# Patient Record
Sex: Male | Born: 1945
Health system: Southern US, Community
[De-identification: ages and names within clinical notes are randomized; demographics above are authoritative.]

## PROBLEM LIST (undated history)

## (undated) DIAGNOSIS — N183 Chronic kidney disease, stage 3 unspecified: Secondary | ICD-10-CM

## (undated) DIAGNOSIS — Z9289 Personal history of other medical treatment: Secondary | ICD-10-CM

## (undated) DIAGNOSIS — I10 Essential (primary) hypertension: Secondary | ICD-10-CM

## (undated) DIAGNOSIS — E785 Hyperlipidemia, unspecified: Secondary | ICD-10-CM

## (undated) DIAGNOSIS — Z8739 Personal history of other diseases of the musculoskeletal system and connective tissue: Secondary | ICD-10-CM

## (undated) DIAGNOSIS — E109 Type 1 diabetes mellitus without complications: Secondary | ICD-10-CM

## (undated) DIAGNOSIS — R000 Tachycardia, unspecified: Secondary | ICD-10-CM

## (undated) DIAGNOSIS — C4491 Basal cell carcinoma of skin, unspecified: Secondary | ICD-10-CM

## (undated) DIAGNOSIS — Z9109 Other allergy status, other than to drugs and biological substances: Secondary | ICD-10-CM

## (undated) DIAGNOSIS — I639 Cerebral infarction, unspecified: Secondary | ICD-10-CM

## (undated) DIAGNOSIS — Z8719 Personal history of other diseases of the digestive system: Secondary | ICD-10-CM

## (undated) DIAGNOSIS — R339 Retention of urine, unspecified: Secondary | ICD-10-CM

## (undated) DIAGNOSIS — E781 Pure hyperglyceridemia: Secondary | ICD-10-CM

## (undated) DIAGNOSIS — J189 Pneumonia, unspecified organism: Secondary | ICD-10-CM

## (undated) DIAGNOSIS — C44222 Squamous cell carcinoma of skin of right ear and external auricular canal: Secondary | ICD-10-CM

## (undated) DIAGNOSIS — H9313 Tinnitus, bilateral: Secondary | ICD-10-CM

## (undated) DIAGNOSIS — K219 Gastro-esophageal reflux disease without esophagitis: Secondary | ICD-10-CM

## (undated) DIAGNOSIS — Z973 Presence of spectacles and contact lenses: Secondary | ICD-10-CM

## (undated) DIAGNOSIS — M1990 Unspecified osteoarthritis, unspecified site: Secondary | ICD-10-CM

## (undated) DIAGNOSIS — R05 Cough: Secondary | ICD-10-CM

## (undated) DIAGNOSIS — R053 Chronic cough: Secondary | ICD-10-CM

## (undated) DIAGNOSIS — Z9641 Presence of insulin pump (external) (internal): Secondary | ICD-10-CM

## (undated) DIAGNOSIS — Z972 Presence of dental prosthetic device (complete) (partial): Secondary | ICD-10-CM

## (undated) DIAGNOSIS — M25551 Pain in right hip: Secondary | ICD-10-CM

## (undated) DIAGNOSIS — Z9189 Other specified personal risk factors, not elsewhere classified: Secondary | ICD-10-CM

## (undated) DIAGNOSIS — Z8639 Personal history of other endocrine, nutritional and metabolic disease: Secondary | ICD-10-CM

## (undated) DIAGNOSIS — R3915 Urgency of urination: Secondary | ICD-10-CM

## (undated) HISTORY — PX: EXCISIONAL HEMORRHOIDECTOMY: SHX1541

## (undated) HISTORY — PX: COLONOSCOPY: SHX174

## (undated) HISTORY — PX: CIRCUMCISION: SUR203

## (undated) HISTORY — PX: HEMORRHOID BANDING: SHX5850

## (undated) HISTORY — PX: SQUAMOUS CELL CARCINOMA EXCISION: SHX2433

## (undated) HISTORY — PX: CATARACT EXTRACTION W/ INTRAOCULAR LENS  IMPLANT, BILATERAL: SHX1307

## (undated) HISTORY — PX: EYE SURGERY: SHX253

## (undated) HISTORY — PX: INGUINAL HERNIA REPAIR: SUR1180

---

## 1998-01-04 ENCOUNTER — Ambulatory Visit (HOSPITAL_COMMUNITY): Admission: RE | Admit: 1998-01-04 | Discharge: 1998-01-04 | Payer: Self-pay | Admitting: Gastroenterology

## 2001-06-17 ENCOUNTER — Encounter: Admission: RE | Admit: 2001-06-17 | Discharge: 2001-09-15 | Payer: Self-pay | Admitting: Endocrinology

## 2005-12-14 ENCOUNTER — Emergency Department (HOSPITAL_COMMUNITY): Admission: EM | Admit: 2005-12-14 | Discharge: 2005-12-14 | Payer: Self-pay | Admitting: Emergency Medicine

## 2006-06-22 DIAGNOSIS — J189 Pneumonia, unspecified organism: Secondary | ICD-10-CM

## 2006-06-22 HISTORY — DX: Pneumonia, unspecified organism: J18.9

## 2006-08-17 ENCOUNTER — Inpatient Hospital Stay (HOSPITAL_COMMUNITY): Admission: EM | Admit: 2006-08-17 | Discharge: 2006-08-19 | Payer: Self-pay | Admitting: *Deleted

## 2007-03-07 ENCOUNTER — Ambulatory Visit: Payer: Self-pay | Admitting: Surgery

## 2007-06-23 HISTORY — PX: ESOPHAGOGASTRODUODENOSCOPY ENDOSCOPY: SHX5814

## 2008-05-22 ENCOUNTER — Ambulatory Visit: Payer: Self-pay | Admitting: Internal Medicine

## 2008-05-22 DIAGNOSIS — K219 Gastro-esophageal reflux disease without esophagitis: Secondary | ICD-10-CM | POA: Insufficient documentation

## 2008-05-22 DIAGNOSIS — R1319 Other dysphagia: Secondary | ICD-10-CM | POA: Insufficient documentation

## 2008-05-22 DIAGNOSIS — E109 Type 1 diabetes mellitus without complications: Secondary | ICD-10-CM | POA: Insufficient documentation

## 2008-06-20 ENCOUNTER — Ambulatory Visit: Payer: Self-pay | Admitting: Internal Medicine

## 2010-11-04 NOTE — Procedures (Signed)
CAROTID DUPLEX EXAM   INDICATION:  Follow up carotid artery disease per [facility] screening.   HISTORY:  Diabetes:  Yes  Cardiac:  No  Hypertension:  No  Smoking:  Quit  Previous Surgery:  No  CV History:  No  Amaurosis Fugax No, Paresthesias No, Hemiparesis No                                       RIGHT             LEFT  Brachial systolic pressure:         125               115  Brachial Doppler waveforms:         Triphasic         Triphasic  Vertebral direction of flow:        Antegrade         Antegrade  DUPLEX VELOCITIES (cm/sec)  CCA peak systolic                   96                88  ECA peak systolic                   157               142  ICA peak systolic                   56                91  ICA end diastolic                   14                30  PLAQUE MORPHOLOGY:                  Homogenous        Homogenous  PLAQUE AMOUNT:                      Minimal           Minimal  PLAQUE LOCATION:                    ICA/bifurcation   ICA/bifurcation   IMPRESSION:  1. The right shows evidence of 1-19% stenosis involving the internal      carotid artery.  2. The left shows evidence of 1-39% stenosis involving the internal      carotid artery.   ___________________________________________  Janetta Hora Fields, MD   AS/MEDQ  D:  03/07/2007  T:  03/08/2007  Job:  [id number]

## 2010-11-07 NOTE — Discharge Summary (Signed)
Randall Horton, Randall Horton                 ACCOUNT NO.:  [id number]   MEDICAL RECORD NO.:  [id number]          PATIENT TYPE:  INP   LOCATION:  5158                         FACILITY:  [facility]   PHYSICIAN:  Tera Mater. Evlyn Kanner, M.D. DATE OF BIRTH:  Feb 07, 1946   DATE OF ADMISSION:  08/16/2006  DATE OF DISCHARGE:  08/19/2006                               DISCHARGE SUMMARY   DISCHARGE DIAGNOSES:  1. Community-acquired pneumonia, clinically improved.  2. Type 1 diabetes on insulin pump.  3. History of gout.   HOSPITAL COURSE:  [name] is a 65 year old, white male with known type  1 diabetes who presented to my partner, Dr. Jacky Kindle, with a left-sided  bacterial pneumonia on August 17, 2006.  He is actually quite ill at  presentation with some volume deficits, tachycardia and some hypoxia.  He did relatively well initially with IV fluids, antibiotics and his  blood sugars were under reasonable control.  He was clinically doing  better and tragically, his wife left the hospital and died at home.  Basically, this shortened his hospital stay.  His oxygen saturation was  reasonable.  He was still mildly febrile with a mild leukocytosis, but  clearly better and I offered him an earlier than optimal discharge due  to the family circumstances.  He did take me up on this offer and left  at my request on August 19, 2006.  Followup was scheduled in 1 week.   DISCHARGE MEDICATIONS:  1. Avelox to continue out a course x10 days.  2. Plavix 75 daily.  3. Allopurinol 30 mg daily and resume his insulin pump.   SPECIAL INSTRUCTIONS:  He was to call if he had substantial fevers or  decompensation.   DIET:  His diet was to be no concentrated sweets as before.   LABORATORY DATA:  Initial white count was at 15,300, hemoglobin 12.4,  platelets 339,000.  Initial sodium 129, potassium 4.9, chloride 100, CO2  was not calculated, glucose 326.  On February 28, sodium was normalized  at 138, potassium 3.9, chloride  103, CO2 26, BUN 11, creatinine 1.05,  glucose of 72.  Influenza A and B were both negative.   A chest x-ray on February 28, showed left lower lobe infiltrate with  pulmonary vascular prominence, question granulomas.  Repeat showed mild  worsening of left lower airspace disease with no evidence of pleural  effusions.  Electrocardiogram was sinus tachycardia with no ischemic  changes.   In summary, we have a 65 year old with diabetes presenting with  community-acquired pneumonia.  He was treated as above and discharged  home quicker than optimally due to a family tragedy.  Close followup as  planned.           ______________________________  Tera Mater. Evlyn Kanner, M.D.     SAS/MEDQ  D:  10/26/2006  T:  10/26/2006  Job:  [id number]

## 2010-11-07 NOTE — H&P (Signed)
NAMEMarland Horton  KOLBEY, TEICHERT NO.:  [id number]   MEDICAL RECORD NO.:  [id number]          PATIENT TYPE:  INP   LOCATION:  1824                         FACILITY:  [facility]   PHYSICIAN:  Geoffry Paradise, M.D.  DATE OF BIRTH:  1946-06-19   DATE OF ADMISSION:  08/17/2006  DATE OF DISCHARGE:                              HISTORY & PHYSICAL   CHIEF COMPLAINT:  Left pneumonia.   HISTORY OF PRESENT ILLNESS:  Randall Horton is a pleasant 65 year old  gentleman with diabetes mellitus type 1 on insulin pump since 1995,  otherwise, healthy, who presents with several days of cold symptoms and  progressive chest congestion.  Evidently developed cold symptoms  approximately 5 days ago and has progressively declined particularly  most of the day yesterday.  Late last evening, upon referral from [facility], he actually presented to the emergency room with  progressive cough, congestion and shortness of breath.  He evidently did  not contact our office and ultimately when condition was worsening  yesterday, presented to [facility].  It was not clear that an  extensive workup was done beyond auscultation and referral to the  emergency room.  In the emergency room here, he was hypoxemic on room  air at 88%, low grade fever.  Chest x-ray revealed a left infiltrate  resulting in referral for this admission.  He has had no sputum  production but has had some pleuritic-type chest pain with this  congestion.  He had no nausea, vomiting, but as had some mild loose  stools.  He has had no rash headaches or abdominal pain.  Blood sugars  remained relatively stable and appetite remained stable.  He is admitted  for further management.   ALLERGIES:  ASPIRIN, PENICILLIN, TETANUS.   MEDICATIONS:  1. Allopurinol 300 daily.  2. Humalog insulin per pump, continuous infusion.  3. Plavix 75 mg daily.  4. Prilosec 20 mg p.o.   MEDICAL ILLNESSES:  1. Diabetes mellitus type 1 since 1995.  2. He is on Plavix because he is intolerant of aspirin in a      preventative way with no prior stroke or cardiac history.   SURGICAL ILLNESSES:  1. Hernia x2.  2. Colonoscopy x2.   FAMILY HISTORY:  Noncontributory.   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  Does not smoke or drink.  He is married.   PHYSICAL EXAMINATION:  VITAL SIGNS:  Temperature 99.5, blood pressure is  110/65, pulse 122 and regular, respiratory rate 18.  Continuous  coughing.  O2 saturation 98% on 2 liters.  GENERAL:  The patient is awake, alert, nontoxic-appearing.  HEENT: Mild conjunctival injection bilaterally.  Anicteric.  Oropharynx  benign.  NECK: No JVD or bruits.  LUNGS: Reveal rhonchi and left lung field, right is clear.  No rales,  wheezing.  Good breath sounds.  CARDIOVASCULAR:  Distant, regular, tachycardiac.  No murmur, S3 or S4.  ABDOMEN:  Soft and nontender.  No hepatosplenomegaly.  Bowel sounds  normal.  BACK:  No CVA or spinal  tenderness.  EXTREMITIES:  No cyanosis, clubbing or edema.  Intact distal pulses.  Joints normal.  Calves soft and nontender.  Negative Homan.  GU/RECTAL:  Not done.  NEUROLOGIC:  Exam not lateralizing.  SKIN:  He does have patchy erythema right distal lower extremity,  appears eczematous.   STUDIES:  Chest x-ray:  Left infiltrate compatible pneumonia.  No  evidence of CHF.   ASSESSMENT:  1. Left pneumonia, presumed bacterial.  2. Diabetes mellitus type 1/2 insulin pump.  3. Hypoxemia secondary to pneumonia.   PLAN:  Admit for IV antibiotics, IV fluids.  Continue insulin pump.  Blood sugars stable currently.           ______________________________  Geoffry Paradise, M.D.     RA/MEDQ  D:  08/17/2006  T:  08/17/2006  Job:  [id number]

## 2011-03-27 LAB — GLUCOSE, CAPILLARY
Glucose-Capillary: 121 mg/dL — ABNORMAL HIGH (ref 70–99)
Glucose-Capillary: 126 mg/dL — ABNORMAL HIGH (ref 70–99)

## 2011-06-23 DIAGNOSIS — Z9289 Personal history of other medical treatment: Secondary | ICD-10-CM

## 2011-06-23 HISTORY — DX: Personal history of other medical treatment: Z92.89

## 2011-07-06 DIAGNOSIS — E785 Hyperlipidemia, unspecified: Secondary | ICD-10-CM | POA: Diagnosis not present

## 2011-07-06 DIAGNOSIS — Z125 Encounter for screening for malignant neoplasm of prostate: Secondary | ICD-10-CM | POA: Diagnosis not present

## 2011-07-06 DIAGNOSIS — E559 Vitamin D deficiency, unspecified: Secondary | ICD-10-CM | POA: Diagnosis not present

## 2011-07-06 DIAGNOSIS — Z0000 Encounter for general adult medical examination without abnormal findings: Secondary | ICD-10-CM | POA: Diagnosis not present

## 2011-07-06 DIAGNOSIS — E0590 Thyrotoxicosis, unspecified without thyrotoxic crisis or storm: Secondary | ICD-10-CM | POA: Diagnosis not present

## 2011-07-06 DIAGNOSIS — E1039 Type 1 diabetes mellitus with other diabetic ophthalmic complication: Secondary | ICD-10-CM | POA: Diagnosis not present

## 2011-08-19 DIAGNOSIS — E1039 Type 1 diabetes mellitus with other diabetic ophthalmic complication: Secondary | ICD-10-CM | POA: Diagnosis not present

## 2011-08-19 DIAGNOSIS — K219 Gastro-esophageal reflux disease without esophagitis: Secondary | ICD-10-CM | POA: Diagnosis not present

## 2011-08-19 DIAGNOSIS — A088 Other specified intestinal infections: Secondary | ICD-10-CM | POA: Diagnosis not present

## 2011-09-11 DIAGNOSIS — R339 Retention of urine, unspecified: Secondary | ICD-10-CM | POA: Diagnosis not present

## 2011-09-11 DIAGNOSIS — R351 Nocturia: Secondary | ICD-10-CM | POA: Diagnosis not present

## 2011-09-11 DIAGNOSIS — R350 Frequency of micturition: Secondary | ICD-10-CM | POA: Diagnosis not present

## 2011-09-29 DIAGNOSIS — R350 Frequency of micturition: Secondary | ICD-10-CM | POA: Diagnosis not present

## 2011-09-29 DIAGNOSIS — R3989 Other symptoms and signs involving the genitourinary system: Secondary | ICD-10-CM | POA: Diagnosis not present

## 2011-09-29 DIAGNOSIS — R339 Retention of urine, unspecified: Secondary | ICD-10-CM | POA: Diagnosis not present

## 2011-09-29 DIAGNOSIS — R351 Nocturia: Secondary | ICD-10-CM | POA: Diagnosis not present

## 2011-11-10 DIAGNOSIS — N401 Enlarged prostate with lower urinary tract symptoms: Secondary | ICD-10-CM | POA: Diagnosis not present

## 2011-11-10 DIAGNOSIS — E1039 Type 1 diabetes mellitus with other diabetic ophthalmic complication: Secondary | ICD-10-CM | POA: Diagnosis not present

## 2011-11-10 DIAGNOSIS — N138 Other obstructive and reflux uropathy: Secondary | ICD-10-CM | POA: Diagnosis not present

## 2011-11-10 DIAGNOSIS — N058 Unspecified nephritic syndrome with other morphologic changes: Secondary | ICD-10-CM | POA: Diagnosis not present

## 2011-11-10 DIAGNOSIS — R209 Unspecified disturbances of skin sensation: Secondary | ICD-10-CM | POA: Diagnosis not present

## 2011-11-10 DIAGNOSIS — N529 Male erectile dysfunction, unspecified: Secondary | ICD-10-CM | POA: Diagnosis not present

## 2011-12-03 DIAGNOSIS — H348392 Tributary (branch) retinal vein occlusion, unspecified eye, stable: Secondary | ICD-10-CM | POA: Diagnosis not present

## 2011-12-03 DIAGNOSIS — Z961 Presence of intraocular lens: Secondary | ICD-10-CM | POA: Diagnosis not present

## 2011-12-14 DIAGNOSIS — R109 Unspecified abdominal pain: Secondary | ICD-10-CM | POA: Diagnosis not present

## 2011-12-14 DIAGNOSIS — M62838 Other muscle spasm: Secondary | ICD-10-CM | POA: Diagnosis not present

## 2011-12-17 DIAGNOSIS — R109 Unspecified abdominal pain: Secondary | ICD-10-CM | POA: Diagnosis not present

## 2011-12-17 DIAGNOSIS — M62838 Other muscle spasm: Secondary | ICD-10-CM | POA: Diagnosis not present

## 2011-12-17 DIAGNOSIS — R351 Nocturia: Secondary | ICD-10-CM | POA: Diagnosis not present

## 2011-12-29 DIAGNOSIS — R109 Unspecified abdominal pain: Secondary | ICD-10-CM | POA: Diagnosis not present

## 2011-12-29 DIAGNOSIS — R350 Frequency of micturition: Secondary | ICD-10-CM | POA: Diagnosis not present

## 2011-12-29 DIAGNOSIS — M62838 Other muscle spasm: Secondary | ICD-10-CM | POA: Diagnosis not present

## 2012-01-07 DIAGNOSIS — R109 Unspecified abdominal pain: Secondary | ICD-10-CM | POA: Diagnosis not present

## 2012-01-07 DIAGNOSIS — M62838 Other muscle spasm: Secondary | ICD-10-CM | POA: Diagnosis not present

## 2012-01-07 DIAGNOSIS — R350 Frequency of micturition: Secondary | ICD-10-CM | POA: Diagnosis not present

## 2012-01-28 DIAGNOSIS — R109 Unspecified abdominal pain: Secondary | ICD-10-CM | POA: Diagnosis not present

## 2012-01-28 DIAGNOSIS — M62838 Other muscle spasm: Secondary | ICD-10-CM | POA: Diagnosis not present

## 2012-01-28 DIAGNOSIS — R350 Frequency of micturition: Secondary | ICD-10-CM | POA: Diagnosis not present

## 2012-03-09 DIAGNOSIS — E559 Vitamin D deficiency, unspecified: Secondary | ICD-10-CM | POA: Diagnosis not present

## 2012-03-09 DIAGNOSIS — M069 Rheumatoid arthritis, unspecified: Secondary | ICD-10-CM | POA: Diagnosis not present

## 2012-03-09 DIAGNOSIS — E1039 Type 1 diabetes mellitus with other diabetic ophthalmic complication: Secondary | ICD-10-CM | POA: Diagnosis not present

## 2012-03-09 DIAGNOSIS — E785 Hyperlipidemia, unspecified: Secondary | ICD-10-CM | POA: Diagnosis not present

## 2012-03-09 DIAGNOSIS — Z23 Encounter for immunization: Secondary | ICD-10-CM | POA: Diagnosis not present

## 2012-03-23 DIAGNOSIS — E119 Type 2 diabetes mellitus without complications: Secondary | ICD-10-CM | POA: Diagnosis not present

## 2012-03-23 DIAGNOSIS — I739 Peripheral vascular disease, unspecified: Secondary | ICD-10-CM | POA: Diagnosis not present

## 2012-03-23 DIAGNOSIS — R5383 Other fatigue: Secondary | ICD-10-CM | POA: Diagnosis not present

## 2012-03-23 DIAGNOSIS — R5381 Other malaise: Secondary | ICD-10-CM | POA: Diagnosis not present

## 2012-04-13 DIAGNOSIS — R079 Chest pain, unspecified: Secondary | ICD-10-CM | POA: Diagnosis not present

## 2012-04-13 DIAGNOSIS — R42 Dizziness and giddiness: Secondary | ICD-10-CM | POA: Diagnosis not present

## 2012-04-13 DIAGNOSIS — E109 Type 1 diabetes mellitus without complications: Secondary | ICD-10-CM | POA: Diagnosis not present

## 2012-04-13 DIAGNOSIS — R0602 Shortness of breath: Secondary | ICD-10-CM | POA: Diagnosis not present

## 2012-05-30 DIAGNOSIS — H348392 Tributary (branch) retinal vein occlusion, unspecified eye, stable: Secondary | ICD-10-CM | POA: Diagnosis not present

## 2012-06-08 DIAGNOSIS — N138 Other obstructive and reflux uropathy: Secondary | ICD-10-CM | POA: Diagnosis not present

## 2012-06-08 DIAGNOSIS — N401 Enlarged prostate with lower urinary tract symptoms: Secondary | ICD-10-CM | POA: Diagnosis not present

## 2012-06-08 DIAGNOSIS — E1039 Type 1 diabetes mellitus with other diabetic ophthalmic complication: Secondary | ICD-10-CM | POA: Diagnosis not present

## 2012-06-08 DIAGNOSIS — N058 Unspecified nephritic syndrome with other morphologic changes: Secondary | ICD-10-CM | POA: Diagnosis not present

## 2012-06-08 DIAGNOSIS — I739 Peripheral vascular disease, unspecified: Secondary | ICD-10-CM | POA: Diagnosis not present

## 2012-07-15 DIAGNOSIS — L2089 Other atopic dermatitis: Secondary | ICD-10-CM | POA: Diagnosis not present

## 2012-07-15 DIAGNOSIS — L570 Actinic keratosis: Secondary | ICD-10-CM | POA: Diagnosis not present

## 2012-07-15 DIAGNOSIS — L821 Other seborrheic keratosis: Secondary | ICD-10-CM | POA: Diagnosis not present

## 2012-09-13 DIAGNOSIS — I739 Peripheral vascular disease, unspecified: Secondary | ICD-10-CM | POA: Diagnosis not present

## 2012-09-13 DIAGNOSIS — E1039 Type 1 diabetes mellitus with other diabetic ophthalmic complication: Secondary | ICD-10-CM | POA: Diagnosis not present

## 2012-09-13 DIAGNOSIS — I6529 Occlusion and stenosis of unspecified carotid artery: Secondary | ICD-10-CM | POA: Diagnosis not present

## 2012-09-13 DIAGNOSIS — E11339 Type 2 diabetes mellitus with moderate nonproliferative diabetic retinopathy without macular edema: Secondary | ICD-10-CM | POA: Diagnosis not present

## 2012-09-13 DIAGNOSIS — E785 Hyperlipidemia, unspecified: Secondary | ICD-10-CM | POA: Diagnosis not present

## 2012-09-13 DIAGNOSIS — E559 Vitamin D deficiency, unspecified: Secondary | ICD-10-CM | POA: Diagnosis not present

## 2012-09-13 DIAGNOSIS — N058 Unspecified nephritic syndrome with other morphologic changes: Secondary | ICD-10-CM | POA: Diagnosis not present

## 2012-09-15 DIAGNOSIS — E109 Type 1 diabetes mellitus without complications: Secondary | ICD-10-CM | POA: Diagnosis not present

## 2012-10-19 DIAGNOSIS — E119 Type 2 diabetes mellitus without complications: Secondary | ICD-10-CM | POA: Diagnosis not present

## 2012-10-19 DIAGNOSIS — E782 Mixed hyperlipidemia: Secondary | ICD-10-CM | POA: Diagnosis not present

## 2012-11-07 DIAGNOSIS — H348392 Tributary (branch) retinal vein occlusion, unspecified eye, stable: Secondary | ICD-10-CM | POA: Diagnosis not present

## 2012-11-10 DIAGNOSIS — M7730 Calcaneal spur, unspecified foot: Secondary | ICD-10-CM | POA: Diagnosis not present

## 2012-11-10 DIAGNOSIS — M12279 Villonodular synovitis (pigmented), unspecified ankle and foot: Secondary | ICD-10-CM | POA: Diagnosis not present

## 2012-11-10 DIAGNOSIS — M79609 Pain in unspecified limb: Secondary | ICD-10-CM | POA: Diagnosis not present

## 2012-11-10 DIAGNOSIS — M65979 Unspecified synovitis and tenosynovitis, unspecified ankle and foot: Secondary | ICD-10-CM | POA: Diagnosis not present

## 2012-11-10 DIAGNOSIS — M659 Synovitis and tenosynovitis, unspecified: Secondary | ICD-10-CM | POA: Diagnosis not present

## 2012-11-17 DIAGNOSIS — M65979 Unspecified synovitis and tenosynovitis, unspecified ankle and foot: Secondary | ICD-10-CM | POA: Diagnosis not present

## 2012-11-17 DIAGNOSIS — M12279 Villonodular synovitis (pigmented), unspecified ankle and foot: Secondary | ICD-10-CM | POA: Diagnosis not present

## 2012-11-17 DIAGNOSIS — M659 Synovitis and tenosynovitis, unspecified: Secondary | ICD-10-CM | POA: Diagnosis not present

## 2012-12-01 DIAGNOSIS — M659 Synovitis and tenosynovitis, unspecified: Secondary | ICD-10-CM | POA: Diagnosis not present

## 2012-12-01 DIAGNOSIS — M65979 Unspecified synovitis and tenosynovitis, unspecified ankle and foot: Secondary | ICD-10-CM | POA: Diagnosis not present

## 2012-12-01 DIAGNOSIS — M25579 Pain in unspecified ankle and joints of unspecified foot: Secondary | ICD-10-CM | POA: Diagnosis not present

## 2012-12-01 DIAGNOSIS — M12279 Villonodular synovitis (pigmented), unspecified ankle and foot: Secondary | ICD-10-CM | POA: Diagnosis not present

## 2012-12-15 DIAGNOSIS — R7402 Elevation of levels of lactic acid dehydrogenase (LDH): Secondary | ICD-10-CM | POA: Diagnosis not present

## 2012-12-15 DIAGNOSIS — I6529 Occlusion and stenosis of unspecified carotid artery: Secondary | ICD-10-CM | POA: Diagnosis not present

## 2012-12-15 DIAGNOSIS — E1039 Type 1 diabetes mellitus with other diabetic ophthalmic complication: Secondary | ICD-10-CM | POA: Diagnosis not present

## 2012-12-15 DIAGNOSIS — E559 Vitamin D deficiency, unspecified: Secondary | ICD-10-CM | POA: Diagnosis not present

## 2012-12-15 DIAGNOSIS — R7401 Elevation of levels of liver transaminase levels: Secondary | ICD-10-CM | POA: Diagnosis not present

## 2012-12-15 DIAGNOSIS — N401 Enlarged prostate with lower urinary tract symptoms: Secondary | ICD-10-CM | POA: Diagnosis not present

## 2012-12-15 DIAGNOSIS — N058 Unspecified nephritic syndrome with other morphologic changes: Secondary | ICD-10-CM | POA: Diagnosis not present

## 2012-12-15 DIAGNOSIS — E785 Hyperlipidemia, unspecified: Secondary | ICD-10-CM | POA: Diagnosis not present

## 2012-12-15 DIAGNOSIS — Z1331 Encounter for screening for depression: Secondary | ICD-10-CM | POA: Diagnosis not present

## 2012-12-15 DIAGNOSIS — N138 Other obstructive and reflux uropathy: Secondary | ICD-10-CM | POA: Diagnosis not present

## 2012-12-15 DIAGNOSIS — E11339 Type 2 diabetes mellitus with moderate nonproliferative diabetic retinopathy without macular edema: Secondary | ICD-10-CM | POA: Diagnosis not present

## 2013-03-22 DIAGNOSIS — E11339 Type 2 diabetes mellitus with moderate nonproliferative diabetic retinopathy without macular edema: Secondary | ICD-10-CM | POA: Diagnosis not present

## 2013-03-22 DIAGNOSIS — I739 Peripheral vascular disease, unspecified: Secondary | ICD-10-CM | POA: Diagnosis not present

## 2013-03-22 DIAGNOSIS — N138 Other obstructive and reflux uropathy: Secondary | ICD-10-CM | POA: Diagnosis not present

## 2013-03-22 DIAGNOSIS — N401 Enlarged prostate with lower urinary tract symptoms: Secondary | ICD-10-CM | POA: Diagnosis not present

## 2013-03-22 DIAGNOSIS — E1039 Type 1 diabetes mellitus with other diabetic ophthalmic complication: Secondary | ICD-10-CM | POA: Diagnosis not present

## 2013-03-22 DIAGNOSIS — E559 Vitamin D deficiency, unspecified: Secondary | ICD-10-CM | POA: Diagnosis not present

## 2013-03-22 DIAGNOSIS — M109 Gout, unspecified: Secondary | ICD-10-CM | POA: Diagnosis not present

## 2013-03-22 DIAGNOSIS — N058 Unspecified nephritic syndrome with other morphologic changes: Secondary | ICD-10-CM | POA: Diagnosis not present

## 2013-03-22 DIAGNOSIS — R6882 Decreased libido: Secondary | ICD-10-CM | POA: Diagnosis not present

## 2013-03-22 DIAGNOSIS — Z23 Encounter for immunization: Secondary | ICD-10-CM | POA: Diagnosis not present

## 2013-03-22 DIAGNOSIS — R7401 Elevation of levels of liver transaminase levels: Secondary | ICD-10-CM | POA: Diagnosis not present

## 2013-03-22 DIAGNOSIS — E785 Hyperlipidemia, unspecified: Secondary | ICD-10-CM | POA: Diagnosis not present

## 2013-03-22 DIAGNOSIS — E0590 Thyrotoxicosis, unspecified without thyrotoxic crisis or storm: Secondary | ICD-10-CM | POA: Diagnosis not present

## 2013-03-22 DIAGNOSIS — R7402 Elevation of levels of lactic acid dehydrogenase (LDH): Secondary | ICD-10-CM | POA: Diagnosis not present

## 2013-03-28 DIAGNOSIS — J0100 Acute maxillary sinusitis, unspecified: Secondary | ICD-10-CM | POA: Diagnosis not present

## 2013-03-28 DIAGNOSIS — Z6830 Body mass index (BMI) 30.0-30.9, adult: Secondary | ICD-10-CM | POA: Diagnosis not present

## 2013-05-08 DIAGNOSIS — E11311 Type 2 diabetes mellitus with unspecified diabetic retinopathy with macular edema: Secondary | ICD-10-CM | POA: Diagnosis not present

## 2013-05-08 DIAGNOSIS — H348392 Tributary (branch) retinal vein occlusion, unspecified eye, stable: Secondary | ICD-10-CM | POA: Diagnosis not present

## 2013-05-08 DIAGNOSIS — H53439 Sector or arcuate defects, unspecified eye: Secondary | ICD-10-CM | POA: Diagnosis not present

## 2013-06-28 DIAGNOSIS — E0590 Thyrotoxicosis, unspecified without thyrotoxic crisis or storm: Secondary | ICD-10-CM | POA: Diagnosis not present

## 2013-06-28 DIAGNOSIS — I6529 Occlusion and stenosis of unspecified carotid artery: Secondary | ICD-10-CM | POA: Diagnosis not present

## 2013-06-28 DIAGNOSIS — N058 Unspecified nephritic syndrome with other morphologic changes: Secondary | ICD-10-CM | POA: Diagnosis not present

## 2013-06-28 DIAGNOSIS — R059 Cough, unspecified: Secondary | ICD-10-CM | POA: Diagnosis not present

## 2013-06-28 DIAGNOSIS — N138 Other obstructive and reflux uropathy: Secondary | ICD-10-CM | POA: Diagnosis not present

## 2013-06-28 DIAGNOSIS — N401 Enlarged prostate with lower urinary tract symptoms: Secondary | ICD-10-CM | POA: Diagnosis not present

## 2013-06-28 DIAGNOSIS — E1039 Type 1 diabetes mellitus with other diabetic ophthalmic complication: Secondary | ICD-10-CM | POA: Diagnosis not present

## 2013-06-28 DIAGNOSIS — R05 Cough: Secondary | ICD-10-CM | POA: Diagnosis not present

## 2013-06-28 DIAGNOSIS — E1142 Type 2 diabetes mellitus with diabetic polyneuropathy: Secondary | ICD-10-CM | POA: Diagnosis not present

## 2013-06-28 DIAGNOSIS — L84 Corns and callosities: Secondary | ICD-10-CM | POA: Diagnosis not present

## 2013-09-27 DIAGNOSIS — M25559 Pain in unspecified hip: Secondary | ICD-10-CM | POA: Diagnosis not present

## 2013-09-27 DIAGNOSIS — R209 Unspecified disturbances of skin sensation: Secondary | ICD-10-CM | POA: Diagnosis not present

## 2013-09-27 DIAGNOSIS — E0590 Thyrotoxicosis, unspecified without thyrotoxic crisis or storm: Secondary | ICD-10-CM | POA: Diagnosis not present

## 2013-09-27 DIAGNOSIS — I739 Peripheral vascular disease, unspecified: Secondary | ICD-10-CM | POA: Diagnosis not present

## 2013-09-27 DIAGNOSIS — E559 Vitamin D deficiency, unspecified: Secondary | ICD-10-CM | POA: Diagnosis not present

## 2013-09-27 DIAGNOSIS — E11339 Type 2 diabetes mellitus with moderate nonproliferative diabetic retinopathy without macular edema: Secondary | ICD-10-CM | POA: Diagnosis not present

## 2013-09-27 DIAGNOSIS — E785 Hyperlipidemia, unspecified: Secondary | ICD-10-CM | POA: Diagnosis not present

## 2013-09-27 DIAGNOSIS — M109 Gout, unspecified: Secondary | ICD-10-CM | POA: Diagnosis not present

## 2013-09-27 DIAGNOSIS — N058 Unspecified nephritic syndrome with other morphologic changes: Secondary | ICD-10-CM | POA: Diagnosis not present

## 2013-09-27 DIAGNOSIS — Z23 Encounter for immunization: Secondary | ICD-10-CM | POA: Diagnosis not present

## 2013-09-27 DIAGNOSIS — E1039 Type 1 diabetes mellitus with other diabetic ophthalmic complication: Secondary | ICD-10-CM | POA: Diagnosis not present

## 2013-10-17 DIAGNOSIS — M25559 Pain in unspecified hip: Secondary | ICD-10-CM | POA: Diagnosis not present

## 2013-10-17 DIAGNOSIS — M545 Low back pain, unspecified: Secondary | ICD-10-CM | POA: Diagnosis not present

## 2013-10-25 DIAGNOSIS — M25559 Pain in unspecified hip: Secondary | ICD-10-CM | POA: Diagnosis not present

## 2013-10-31 DIAGNOSIS — H348392 Tributary (branch) retinal vein occlusion, unspecified eye, stable: Secondary | ICD-10-CM | POA: Diagnosis not present

## 2013-10-31 DIAGNOSIS — E11311 Type 2 diabetes mellitus with unspecified diabetic retinopathy with macular edema: Secondary | ICD-10-CM | POA: Diagnosis not present

## 2013-10-31 DIAGNOSIS — H3581 Retinal edema: Secondary | ICD-10-CM | POA: Diagnosis not present

## 2014-01-02 DIAGNOSIS — E0590 Thyrotoxicosis, unspecified without thyrotoxic crisis or storm: Secondary | ICD-10-CM | POA: Diagnosis not present

## 2014-01-02 DIAGNOSIS — E785 Hyperlipidemia, unspecified: Secondary | ICD-10-CM | POA: Diagnosis not present

## 2014-01-02 DIAGNOSIS — E11339 Type 2 diabetes mellitus with moderate nonproliferative diabetic retinopathy without macular edema: Secondary | ICD-10-CM | POA: Diagnosis not present

## 2014-01-02 DIAGNOSIS — Z6830 Body mass index (BMI) 30.0-30.9, adult: Secondary | ICD-10-CM | POA: Diagnosis not present

## 2014-01-02 DIAGNOSIS — M109 Gout, unspecified: Secondary | ICD-10-CM | POA: Diagnosis not present

## 2014-01-02 DIAGNOSIS — E1142 Type 2 diabetes mellitus with diabetic polyneuropathy: Secondary | ICD-10-CM | POA: Diagnosis not present

## 2014-01-02 DIAGNOSIS — E1039 Type 1 diabetes mellitus with other diabetic ophthalmic complication: Secondary | ICD-10-CM | POA: Diagnosis not present

## 2014-01-02 DIAGNOSIS — E559 Vitamin D deficiency, unspecified: Secondary | ICD-10-CM | POA: Diagnosis not present

## 2014-03-28 DIAGNOSIS — M25551 Pain in right hip: Secondary | ICD-10-CM | POA: Diagnosis not present

## 2014-04-07 DIAGNOSIS — M1611 Unilateral primary osteoarthritis, right hip: Secondary | ICD-10-CM | POA: Diagnosis not present

## 2014-04-10 DIAGNOSIS — M25551 Pain in right hip: Secondary | ICD-10-CM | POA: Diagnosis not present

## 2014-04-10 DIAGNOSIS — Z23 Encounter for immunization: Secondary | ICD-10-CM | POA: Diagnosis not present

## 2014-04-12 DIAGNOSIS — R350 Frequency of micturition: Secondary | ICD-10-CM | POA: Diagnosis not present

## 2014-04-12 DIAGNOSIS — Z125 Encounter for screening for malignant neoplasm of prostate: Secondary | ICD-10-CM | POA: Diagnosis not present

## 2014-04-12 DIAGNOSIS — N509 Disorder of male genital organs, unspecified: Secondary | ICD-10-CM | POA: Diagnosis not present

## 2014-04-12 DIAGNOSIS — R339 Retention of urine, unspecified: Secondary | ICD-10-CM | POA: Diagnosis not present

## 2014-05-01 DIAGNOSIS — H53431 Sector or arcuate defects, right eye: Secondary | ICD-10-CM | POA: Diagnosis not present

## 2014-05-01 DIAGNOSIS — H524 Presbyopia: Secondary | ICD-10-CM | POA: Diagnosis not present

## 2014-05-01 DIAGNOSIS — H34211 Partial retinal artery occlusion, right eye: Secondary | ICD-10-CM | POA: Diagnosis not present

## 2014-05-01 DIAGNOSIS — E11329 Type 2 diabetes mellitus with mild nonproliferative diabetic retinopathy without macular edema: Secondary | ICD-10-CM | POA: Diagnosis not present

## 2014-05-01 DIAGNOSIS — H34839 Tributary (branch) retinal vein occlusion, unspecified eye: Secondary | ICD-10-CM | POA: Diagnosis not present

## 2014-05-01 DIAGNOSIS — H34831 Tributary (branch) retinal vein occlusion, right eye: Secondary | ICD-10-CM | POA: Diagnosis not present

## 2014-05-01 DIAGNOSIS — Z961 Presence of intraocular lens: Secondary | ICD-10-CM | POA: Diagnosis not present

## 2014-05-08 DIAGNOSIS — E11339 Type 2 diabetes mellitus with moderate nonproliferative diabetic retinopathy without macular edema: Secondary | ICD-10-CM | POA: Diagnosis not present

## 2014-05-08 DIAGNOSIS — R42 Dizziness and giddiness: Secondary | ICD-10-CM | POA: Diagnosis not present

## 2014-05-08 DIAGNOSIS — E559 Vitamin D deficiency, unspecified: Secondary | ICD-10-CM | POA: Diagnosis not present

## 2014-05-08 DIAGNOSIS — I739 Peripheral vascular disease, unspecified: Secondary | ICD-10-CM | POA: Diagnosis not present

## 2014-05-08 DIAGNOSIS — E10319 Type 1 diabetes mellitus with unspecified diabetic retinopathy without macular edema: Secondary | ICD-10-CM | POA: Diagnosis not present

## 2014-05-08 DIAGNOSIS — E1142 Type 2 diabetes mellitus with diabetic polyneuropathy: Secondary | ICD-10-CM | POA: Diagnosis not present

## 2014-05-08 DIAGNOSIS — K219 Gastro-esophageal reflux disease without esophagitis: Secondary | ICD-10-CM | POA: Diagnosis not present

## 2014-05-08 DIAGNOSIS — R740 Nonspecific elevation of levels of transaminase and lactic acid dehydrogenase [LDH]: Secondary | ICD-10-CM | POA: Diagnosis not present

## 2014-05-08 DIAGNOSIS — E0590 Thyrotoxicosis, unspecified without thyrotoxic crisis or storm: Secondary | ICD-10-CM | POA: Diagnosis not present

## 2014-05-08 DIAGNOSIS — E785 Hyperlipidemia, unspecified: Secondary | ICD-10-CM | POA: Diagnosis not present

## 2014-05-15 DIAGNOSIS — N401 Enlarged prostate with lower urinary tract symptoms: Secondary | ICD-10-CM | POA: Diagnosis not present

## 2014-05-15 DIAGNOSIS — N509 Disorder of male genital organs, unspecified: Secondary | ICD-10-CM | POA: Diagnosis not present

## 2014-05-15 DIAGNOSIS — R339 Retention of urine, unspecified: Secondary | ICD-10-CM | POA: Diagnosis not present

## 2014-05-15 DIAGNOSIS — R350 Frequency of micturition: Secondary | ICD-10-CM | POA: Diagnosis not present

## 2014-05-15 DIAGNOSIS — R351 Nocturia: Secondary | ICD-10-CM | POA: Diagnosis not present

## 2014-05-24 ENCOUNTER — Other Ambulatory Visit: Payer: Self-pay | Admitting: Urology

## 2014-06-21 NOTE — Progress Notes (Signed)
LOV note 05/08/14 Dr. Forde Dandy on chart, Chest x-ray 06/28/13 on chart, EKG 2012 and 2014 on chart, LOV note 10/19/12 Dr. Gwenlyn Found on chart, stress test 2013 on chart

## 2014-06-21 NOTE — Patient Instructions (Addendum)
Randall Horton  06/21/2014   Your procedure is scheduled on: Thursday 06/28/14  Report to [facility]  Entrance and follow signs to               [place] at 06:30 AM.  Call this number if you have problems the morning of surgery [phone]   Remember:  Do not eat food or drink liquids :After Midnight.     Take these medicines the morning of surgery with A SIP OF WATER: gabapentin, claritin if needed, prilosec                               You may not have any metal on your body including hair pins and              piercings  Do not wear jewelry, make-up, lotions, powders or perfumes.             Do not wear nail polish.  Do not shave  48 hours prior to surgery.              Men may shave face and neck.  Do not bring valuables to the hospital. [place].  Contacts, dentures or bridgework may not be worn into surgery.  Leave suitcase in the car. After surgery it may be brought to your room.   _____________________________________________________________________             [facility] - Preparing for Surgery Before surgery, you can play an important role.  Because skin is not sterile, your skin needs to be as free of germs as possible.  You can reduce the number of germs on your skin by washing with CHG (chlorahexidine gluconate) soap before surgery.  CHG is an antiseptic cleaner which kills germs and bonds with the skin to continue killing germs even after washing. Please DO NOT use if you have an allergy to CHG or antibacterial soaps.  If your skin becomes reddened/irritated stop using the CHG and inform your nurse when you arrive at Short Stay. Do not shave (including legs and underarms) for at least 48 hours prior to the first CHG shower.  You may shave your face/neck. Please follow these instructions carefully:  1.  Shower with CHG Soap the night before surgery and the  morning of Surgery.  2.  If  you choose to wash your hair, wash your hair first as usual with your  normal  shampoo.  3.  After you shampoo, rinse your hair and body thoroughly to remove the  shampoo.                            4.  Use CHG as you would any other liquid soap.  You can apply chg directly  to the skin and wash                       Gently with a scrungie or clean washcloth.  5.  Apply the CHG Soap to your body ONLY FROM THE NECK DOWN.   Do not use on face/ open  Wound or open sores. Avoid contact with eyes, ears mouth and genitals (private parts).                       Wash face,  Genitals (private parts) with your normal soap.             6.  Wash thoroughly, paying special attention to the area where your surgery  will be performed.  7.  Thoroughly rinse your body with warm water from the neck down.  8.  DO NOT shower/wash with your normal soap after using and rinsing off  the CHG Soap.                9.  Pat yourself dry with a clean towel.            10.  Wear clean pajamas.            11.  Place clean sheets on your bed the night of your first shower and do not  sleep with pets. Day of Surgery : Do not apply any lotions/deodorants the morning of surgery.  Please wear clean clothes to the hospital/surgery center.  FAILURE TO FOLLOW THESE INSTRUCTIONS MAY RESULT IN THE CANCELLATION OF YOUR SURGERY PATIENT SIGNATURE_________________________________  NURSE SIGNATURE__________________________________  ________________________________________________________________________   Adam Phenix  An incentive spirometer is a tool that can help keep your lungs clear and active. This tool measures how well you are filling your lungs with each breath. Taking long deep breaths may help reverse or decrease the chance of developing breathing (pulmonary) problems (especially infection) following:  A long period of time when you are unable to move or be active. BEFORE THE PROCEDURE    If the spirometer includes an indicator to show your best effort, your nurse or respiratory therapist will set it to a desired goal.  If possible, sit up straight or lean slightly forward. Try not to slouch.  Hold the incentive spirometer in an upright position. INSTRUCTIONS FOR USE  1. Sit on the edge of your bed if possible, or sit up as far as you can in bed or on a chair. 2. Hold the incentive spirometer in an upright position. 3. Breathe out normally. 4. Place the mouthpiece in your mouth and seal your lips tightly around it. 5. Breathe in slowly and as deeply as possible, raising the piston or the ball toward the top of the column. 6. Hold your breath for 3-5 seconds or for as long as possible. Allow the piston or ball to fall to the bottom of the column. 7. Remove the mouthpiece from your mouth and breathe out normally. 8. Rest for a few seconds and repeat Steps 1 through 7 at least 10 times every 1-2 hours when you are awake. Take your time and take a few normal breaths between deep breaths. 9. The spirometer may include an indicator to show your best effort. Use the indicator as a goal to work toward during each repetition. 10. After each set of 10 deep breaths, practice coughing to be sure your lungs are clear. If you have an incision (the cut made at the time of surgery), support your incision when coughing by placing a pillow or rolled up towels firmly against it. Once you are able to get out of bed, walk around indoors and cough well. You may stop using the incentive spirometer when instructed by your caregiver.  RISKS AND COMPLICATIONS  Take your time so you do not get  dizzy or light-headed.  If you are in pain, you may need to take or ask for pain medication before doing incentive spirometry. It is harder to take a deep breath if you are having pain. AFTER USE  Rest and breathe slowly and easily.  It can be helpful to keep track of a log of your progress. Your caregiver  can provide you with a simple table to help with this. If you are using the spirometer at home, follow these instructions: [place] IF:   You are having difficultly using the spirometer.  You have trouble using the spirometer as often as instructed.  Your pain medication is not giving enough relief while using the spirometer.  You develop fever of 100.5 F (38.1 C) or higher. SEEK IMMEDIATE MEDICAL CARE IF:   You cough up bloody sputum that had not been present before.  You develop fever of 102 F (38.9 C) or greater.  You develop worsening pain at or near the incision site. MAKE SURE YOU:   Understand these instructions.  Will watch your condition.  Will get help right away if you are not doing well or get worse. Document Released: 10/19/2006 Document Revised: 08/31/2011 Document Reviewed: 12/20/2006 [facility] Patient Information 2014 [place], [place].   ________________________________________________________________________

## 2014-06-25 ENCOUNTER — Ambulatory Visit (HOSPITAL_COMMUNITY)
Admission: RE | Admit: 2014-06-25 | Discharge: 2014-06-25 | Disposition: A | Discharge status: Home / Self Care | Payer: Medicare HMO | Source: Ambulatory Visit | Attending: Anesthesiology | Admitting: Anesthesiology

## 2014-06-25 ENCOUNTER — Encounter (HOSPITAL_COMMUNITY)
Admission: RE | Admit: 2014-06-25 | Discharge: 2014-06-25 | Disposition: A | Discharge status: Home / Self Care | Payer: Medicare HMO | Source: Ambulatory Visit | Attending: Urology | Admitting: Urology

## 2014-06-25 ENCOUNTER — Encounter (HOSPITAL_COMMUNITY): Payer: Self-pay

## 2014-06-25 DIAGNOSIS — Z01818 Encounter for other preprocedural examination: Secondary | ICD-10-CM | POA: Diagnosis not present

## 2014-06-25 DIAGNOSIS — R05 Cough: Secondary | ICD-10-CM | POA: Diagnosis not present

## 2014-06-25 DIAGNOSIS — E119 Type 2 diabetes mellitus without complications: Secondary | ICD-10-CM | POA: Diagnosis not present

## 2014-06-25 DIAGNOSIS — M5134 Other intervertebral disc degeneration, thoracic region: Secondary | ICD-10-CM | POA: Insufficient documentation

## 2014-06-25 DIAGNOSIS — Z87891 Personal history of nicotine dependence: Secondary | ICD-10-CM | POA: Diagnosis not present

## 2014-06-25 DIAGNOSIS — J8410 Pulmonary fibrosis, unspecified: Secondary | ICD-10-CM | POA: Diagnosis not present

## 2014-06-25 DIAGNOSIS — Z9641 Presence of insulin pump (external) (internal): Secondary | ICD-10-CM | POA: Insufficient documentation

## 2014-06-25 DIAGNOSIS — R059 Cough, unspecified: Secondary | ICD-10-CM

## 2014-06-25 HISTORY — DX: Personal history of other medical treatment: Z92.89

## 2014-06-25 HISTORY — DX: Other allergy status, other than to drugs and biological substances: Z91.09

## 2014-06-25 HISTORY — DX: Presence of insulin pump (external) (internal): Z96.41

## 2014-06-25 HISTORY — DX: Pneumonia, unspecified organism: J18.9

## 2014-06-25 HISTORY — DX: Personal history of other diseases of the digestive system: Z87.19

## 2014-06-25 HISTORY — DX: Unspecified osteoarthritis, unspecified site: M19.90

## 2014-06-25 HISTORY — DX: Tachycardia, unspecified: R00.0

## 2014-06-25 HISTORY — DX: Pain in right hip: M25.551

## 2014-06-25 HISTORY — DX: Gastro-esophageal reflux disease without esophagitis: K21.9

## 2014-06-25 HISTORY — DX: Pure hyperglyceridemia: E78.1

## 2014-06-25 LAB — BASIC METABOLIC PANEL
Anion gap: 5 (ref 5–15)
BUN: 19 mg/dL (ref 6–23)
CO2: 28 mmol/L (ref 19–32)
Calcium: 9.8 mg/dL (ref 8.4–10.5)
Chloride: 104 mEq/L (ref 96–112)
Creatinine, Ser: 1.17 mg/dL (ref 0.50–1.35)
GFR calc Af Amer: 71 mL/min — ABNORMAL LOW (ref >90)
GFR calc non Af Amer: 61 mL/min — ABNORMAL LOW (ref >90)
Glucose, Bld: 130 mg/dL — ABNORMAL HIGH (ref 70–99)
Potassium: 4.4 mmol/L (ref 3.5–5.1)
Sodium: 137 mmol/L (ref 135–145)

## 2014-06-25 LAB — CBC
HCT: 43.3 % (ref 39.0–52.0)
Hemoglobin: 14.7 g/dL (ref 13.0–17.0)
MCH: 31.1 pg (ref 26.0–34.0)
MCHC: 33.9 g/dL (ref 30.0–36.0)
MCV: 91.5 fL (ref 78.0–100.0)
Platelets: 240 10*3/uL (ref 150–400)
RBC: 4.73 MIL/uL (ref 4.22–5.81)
RDW: 13.2 % (ref 11.5–15.5)
WBC: 11 10*3/uL — ABNORMAL HIGH (ref 4.0–10.5)

## 2014-06-25 NOTE — Progress Notes (Signed)
Informed Adah Perl the Diabetes Coordinator of the upcoming surgery and insulin pump.

## 2014-06-25 NOTE — Progress Notes (Signed)
   06/25/14 1052  OBSTRUCTIVE SLEEP APNEA  Have you ever been diagnosed with sleep apnea through a sleep study? No  Do you snore loudly (loud enough to be heard through closed doors)?  1  Do you often feel tired, fatigued, or sleepy during the daytime? 0  Has anyone observed you stop breathing during your sleep? 0  Do you have, or are you being treated for high blood pressure? 0  BMI more than 35 kg/m2? 0  Age over 71 years old? 1  Neck circumference greater than 40 cm/16 inches? 1  Gender: 1  Obstructive Sleep Apnea Score 4  Score 4 or greater  Results sent to PCP

## 2014-06-26 LAB — PSA: PSA: 1.3 ng/mL (ref <=4.00)

## 2014-06-28 ENCOUNTER — Encounter (HOSPITAL_BASED_OUTPATIENT_CLINIC_OR_DEPARTMENT_OTHER): Payer: Self-pay | Admitting: *Deleted

## 2014-06-28 ENCOUNTER — Ambulatory Visit (HOSPITAL_COMMUNITY): Payer: Medicare HMO | Admitting: Anesthesiology

## 2014-06-28 ENCOUNTER — Encounter (HOSPITAL_COMMUNITY)
Admission: RE | Disposition: A | Discharge status: Home / Self Care | Payer: Self-pay | Source: Ambulatory Visit | Attending: Urology

## 2014-06-28 ENCOUNTER — Observation Stay (HOSPITAL_COMMUNITY)
Admission: RE | Admit: 2014-06-28 | Discharge: 2014-06-29 | Disposition: A | Discharge status: Home / Self Care | Payer: Medicare HMO | Source: Ambulatory Visit | Attending: Urology | Admitting: Urology

## 2014-06-28 DIAGNOSIS — R3915 Urgency of urination: Secondary | ICD-10-CM | POA: Diagnosis not present

## 2014-06-28 DIAGNOSIS — M25559 Pain in unspecified hip: Secondary | ICD-10-CM | POA: Diagnosis not present

## 2014-06-28 DIAGNOSIS — Z79899 Other long term (current) drug therapy: Secondary | ICD-10-CM | POA: Diagnosis not present

## 2014-06-28 DIAGNOSIS — F329 Major depressive disorder, single episode, unspecified: Secondary | ICD-10-CM | POA: Diagnosis not present

## 2014-06-28 DIAGNOSIS — R351 Nocturia: Secondary | ICD-10-CM | POA: Insufficient documentation

## 2014-06-28 DIAGNOSIS — M109 Gout, unspecified: Secondary | ICD-10-CM | POA: Diagnosis not present

## 2014-06-28 DIAGNOSIS — F17220 Nicotine dependence, chewing tobacco, uncomplicated: Secondary | ICD-10-CM | POA: Diagnosis not present

## 2014-06-28 DIAGNOSIS — R339 Retention of urine, unspecified: Secondary | ICD-10-CM | POA: Diagnosis not present

## 2014-06-28 DIAGNOSIS — R350 Frequency of micturition: Secondary | ICD-10-CM | POA: Insufficient documentation

## 2014-06-28 DIAGNOSIS — Z794 Long term (current) use of insulin: Secondary | ICD-10-CM | POA: Insufficient documentation

## 2014-06-28 DIAGNOSIS — N401 Enlarged prostate with lower urinary tract symptoms: Principal | ICD-10-CM | POA: Insufficient documentation

## 2014-06-28 DIAGNOSIS — K219 Gastro-esophageal reflux disease without esophagitis: Secondary | ICD-10-CM | POA: Diagnosis not present

## 2014-06-28 DIAGNOSIS — E119 Type 2 diabetes mellitus without complications: Secondary | ICD-10-CM | POA: Insufficient documentation

## 2014-06-28 DIAGNOSIS — Z884 Allergy status to anesthetic agent status: Secondary | ICD-10-CM | POA: Diagnosis not present

## 2014-06-28 DIAGNOSIS — N400 Enlarged prostate without lower urinary tract symptoms: Secondary | ICD-10-CM | POA: Diagnosis present

## 2014-06-28 DIAGNOSIS — E780 Pure hypercholesterolemia: Secondary | ICD-10-CM | POA: Insufficient documentation

## 2014-06-28 DIAGNOSIS — Z880 Allergy status to penicillin: Secondary | ICD-10-CM | POA: Diagnosis not present

## 2014-06-28 DIAGNOSIS — M1990 Unspecified osteoarthritis, unspecified site: Secondary | ICD-10-CM | POA: Insufficient documentation

## 2014-06-28 DIAGNOSIS — Z87891 Personal history of nicotine dependence: Secondary | ICD-10-CM | POA: Insufficient documentation

## 2014-06-28 HISTORY — PX: TRANSURETHRAL RESECTION OF PROSTATE: SHX73

## 2014-06-28 LAB — GLUCOSE, CAPILLARY
Glucose-Capillary: 152 mg/dL — ABNORMAL HIGH (ref 70–99)
Glucose-Capillary: 206 mg/dL — ABNORMAL HIGH (ref 70–99)
Glucose-Capillary: 145 mg/dL — ABNORMAL HIGH (ref 70–99)
Glucose-Capillary: 188 mg/dL — ABNORMAL HIGH (ref 70–99)

## 2014-06-28 LAB — HEMOGLOBIN A1C
Hgb A1c MFr Bld: 7.2 % — ABNORMAL HIGH (ref <5.7)
Mean Plasma Glucose: 160 mg/dL — ABNORMAL HIGH (ref <117)

## 2014-06-28 LAB — POCT I-STAT 4, (NA,K, GLUC, HGB,HCT)
Glucose, Bld: 158 mg/dL — ABNORMAL HIGH (ref 70–99)
HCT: 42 % (ref 39.0–52.0)
Hemoglobin: 14.3 g/dL (ref 13.0–17.0)
Potassium: 4.1 mmol/L (ref 3.5–5.1)
Sodium: 140 mmol/L (ref 135–145)

## 2014-06-28 SURGERY — TURP (TRANSURETHRAL RESECTION OF PROSTATE)
Anesthesia: General | Site: Prostate

## 2014-06-28 MED ORDER — BACITRACIN-NEOMYCIN-POLYMYXIN 400-5-5000 EX OINT
1.0000 "application " | TOPICAL_OINTMENT | Freq: Three times a day (TID) | CUTANEOUS | Status: DC | PRN
  Filled 2014-06-28: qty 1

## 2014-06-28 MED ORDER — HYOSCYAMINE SULFATE 0.125 MG SL SUBL
0.1250 mg | SUBLINGUAL_TABLET | SUBLINGUAL | Status: DC | PRN
  Filled 2014-06-28: qty 1

## 2014-06-28 MED ORDER — OMEPRAZOLE MAGNESIUM 20 MG PO TBEC: 20.0000 mg | DELAYED_RELEASE_TABLET | ORAL | Status: DC

## 2014-06-28 MED ORDER — DIPHENHYDRAMINE HCL 25 MG PO CAPS
25.0000 mg | ORAL_CAPSULE | Freq: Every evening | ORAL | Status: DC | PRN
  Filled 2014-06-28: qty 1

## 2014-06-28 MED ORDER — INSULIN ASPART 100 UNIT/ML ~~LOC~~ SOLN
0.0000 [IU] | Freq: Three times a day (TID) | SUBCUTANEOUS | Status: DC
  Administered 2014-06-29: 12.5 [IU] via SUBCUTANEOUS

## 2014-06-28 MED ORDER — BELLADONNA ALKALOIDS-OPIUM 16.2-60 MG RE SUPP
RECTAL | Status: AC
  Filled 2014-06-28: qty 1

## 2014-06-28 MED ORDER — ONDANSETRON HCL 4 MG/2ML IJ SOLN
INTRAMUSCULAR | Status: DC | PRN
  Administered 2014-06-28: 4 mg via INTRAVENOUS

## 2014-06-28 MED ORDER — INSULIN PUMP
Freq: Three times a day (TID) | SUBCUTANEOUS | Status: DC
  Administered 2014-06-28 – 2014-06-29 (×2): via SUBCUTANEOUS
  Filled 2014-06-28: qty 1

## 2014-06-28 MED ORDER — GUAIFENESIN ER 600 MG PO TB12
600.0000 mg | ORAL_TABLET | Freq: Two times a day (BID) | ORAL | Status: DC | PRN
  Administered 2014-06-28: 600 mg via ORAL
  Filled 2014-06-28 (×2): qty 2

## 2014-06-28 MED ORDER — ALLOPURINOL 300 MG PO TABS
300.0000 mg | ORAL_TABLET | Freq: Every morning | ORAL | Status: DC
  Administered 2014-06-28 – 2014-06-29 (×2): 300 mg via ORAL
  Filled 2014-06-28 (×2): qty 1

## 2014-06-28 MED ORDER — HYDROMORPHONE HCL 1 MG/ML IJ SOLN: 0.5000 mg | INTRAMUSCULAR | Status: DC | PRN

## 2014-06-28 MED ORDER — SODIUM CHLORIDE 0.9 % IR SOLN
Status: DC | PRN
  Administered 2014-06-28 (×3): 3000 mL
  Administered 2014-06-28: 6000 mL

## 2014-06-28 MED ORDER — PROPOFOL 10 MG/ML IV BOLUS
INTRAVENOUS | Status: DC | PRN
  Administered 2014-06-28: 200 mg via INTRAVENOUS

## 2014-06-28 MED ORDER — GABAPENTIN 100 MG PO CAPS
200.0000 mg | ORAL_CAPSULE | Freq: Two times a day (BID) | ORAL | Status: DC
  Administered 2014-06-28 – 2014-06-29 (×2): 200 mg via ORAL
  Filled 2014-06-28 (×3): qty 2

## 2014-06-28 MED ORDER — PROMETHAZINE HCL 25 MG/ML IJ SOLN
6.2500 mg | INTRAMUSCULAR | Status: DC | PRN
  Filled 2014-06-28: qty 1

## 2014-06-28 MED ORDER — ACETAMINOPHEN 500 MG PO TABS: 1000.0000 mg | ORAL_TABLET | Freq: Four times a day (QID) | ORAL | Status: DC | PRN

## 2014-06-28 MED ORDER — LACTATED RINGERS IV SOLN
INTRAVENOUS | Status: DC
  Administered 2014-06-28: 08:00:00 via INTRAVENOUS
  Filled 2014-06-28: qty 1000

## 2014-06-28 MED ORDER — FENTANYL CITRATE 0.05 MG/ML IJ SOLN
INTRAMUSCULAR | Status: AC
  Filled 2014-06-28: qty 2

## 2014-06-28 MED ORDER — OXYCODONE-ACETAMINOPHEN 5-325 MG PO TABS
1.0000 | ORAL_TABLET | ORAL | Status: DC | PRN
  Administered 2014-06-28: 1 via ORAL
  Administered 2014-06-29: 2 via ORAL
  Filled 2014-06-28: qty 2
  Filled 2014-06-28: qty 1

## 2014-06-28 MED ORDER — CIPROFLOXACIN HCL 500 MG PO TABS
500.0000 mg | ORAL_TABLET | Freq: Two times a day (BID) | ORAL | Status: DC
  Administered 2014-06-28 – 2014-06-29 (×2): 500 mg via ORAL
  Filled 2014-06-28 (×4): qty 1

## 2014-06-28 MED ORDER — KETOROLAC TROMETHAMINE 30 MG/ML IJ SOLN
INTRAMUSCULAR | Status: DC | PRN
  Administered 2014-06-28: 15 mg via INTRAVENOUS

## 2014-06-28 MED ORDER — CLOTRIMAZOLE 10 MG MT TROC
10.0000 mg | Freq: Every day | OROMUCOSAL | Status: DC
  Administered 2014-06-28 – 2014-06-29 (×3): 10 mg via ORAL
  Filled 2014-06-28 (×3): qty 1

## 2014-06-28 MED ORDER — PANTOPRAZOLE SODIUM 40 MG PO TBEC
40.0000 mg | DELAYED_RELEASE_TABLET | Freq: Every day | ORAL | Status: DC
  Administered 2014-06-28 – 2014-06-29 (×2): 40 mg via ORAL
  Filled 2014-06-28 (×3): qty 1

## 2014-06-28 MED ORDER — SENNOSIDES-DOCUSATE SODIUM 8.6-50 MG PO TABS
2.0000 | ORAL_TABLET | Freq: Every day | ORAL | Status: DC
  Administered 2014-06-28: 2 via ORAL
  Filled 2014-06-28: qty 2

## 2014-06-28 MED ORDER — ONDANSETRON HCL 4 MG/2ML IJ SOLN: 4.0000 mg | INTRAMUSCULAR | Status: DC | PRN

## 2014-06-28 MED ORDER — CIPROFLOXACIN IN D5W 400 MG/200ML IV SOLN
400.0000 mg | INTRAVENOUS | Status: DC
  Filled 2014-06-28: qty 200

## 2014-06-28 MED ORDER — FENTANYL CITRATE 0.05 MG/ML IJ SOLN
INTRAMUSCULAR | Status: AC
  Filled 2014-06-28: qty 4

## 2014-06-28 MED ORDER — LORATADINE 10 MG PO TABS
10.0000 mg | ORAL_TABLET | Freq: Every day | ORAL | Status: DC | PRN
  Filled 2014-06-28: qty 1

## 2014-06-28 MED ORDER — FENTANYL CITRATE 0.05 MG/ML IJ SOLN
25.0000 ug | INTRAMUSCULAR | Status: DC | PRN
  Administered 2014-06-28 (×2): 25 ug via INTRAVENOUS
  Filled 2014-06-28: qty 1

## 2014-06-28 MED ORDER — BELLADONNA ALKALOIDS-OPIUM 16.2-60 MG RE SUPP
RECTAL | Status: DC | PRN
  Administered 2014-06-28: 1 via RECTAL

## 2014-06-28 MED ORDER — FENOFIBRATE 160 MG PO TABS
160.0000 mg | ORAL_TABLET | Freq: Every evening | ORAL | Status: DC
  Administered 2014-06-28: 160 mg via ORAL
  Filled 2014-06-28 (×2): qty 1

## 2014-06-28 MED ORDER — INSULIN PUMP: 110.0000 | Freq: Every day | SUBCUTANEOUS | Status: DC

## 2014-06-28 MED ORDER — ACETAMINOPHEN 10 MG/ML IV SOLN
INTRAVENOUS | Status: DC | PRN
  Administered 2014-06-28: 1000 mg via INTRAVENOUS

## 2014-06-28 MED ORDER — SODIUM CHLORIDE 0.45 % IV SOLN
INTRAVENOUS | Status: DC
  Administered 2014-06-28: 11:00:00 via INTRAVENOUS
  Filled 2014-06-28: qty 1000

## 2014-06-28 MED ORDER — LIDOCAINE HCL (CARDIAC) 20 MG/ML IV SOLN
INTRAVENOUS | Status: DC | PRN
  Administered 2014-06-28: 80 mg via INTRAVENOUS

## 2014-06-28 MED ORDER — MONTELUKAST SODIUM 10 MG PO TABS
10.0000 mg | ORAL_TABLET | Freq: Every evening | ORAL | Status: DC
  Administered 2014-06-28: 10 mg via ORAL
  Filled 2014-06-28 (×2): qty 1

## 2014-06-28 MED ORDER — FENTANYL CITRATE 0.05 MG/ML IJ SOLN
INTRAMUSCULAR | Status: DC | PRN
  Administered 2014-06-28: 100 ug via INTRAVENOUS
  Administered 2014-06-28: 50 ug via INTRAVENOUS

## 2014-06-28 MED ORDER — ZOLPIDEM TARTRATE 10 MG PO TABS
5.0000 mg | ORAL_TABLET | Freq: Every day | ORAL | Status: DC
  Filled 2014-06-28: qty 1

## 2014-06-28 MED ORDER — CIPROFLOXACIN IN D5W 400 MG/200ML IV SOLN
400.0000 mg | INTRAVENOUS | Status: AC
  Administered 2014-06-28: 400 mg via INTRAVENOUS
  Filled 2014-06-28: qty 200

## 2014-06-28 MED ORDER — CLOBETASOL PROPIONATE 0.05 % EX CREA
1.0000 "application " | TOPICAL_CREAM | Freq: Two times a day (BID) | CUTANEOUS | Status: DC
  Administered 2014-06-28 – 2014-06-29 (×2): 1 via TOPICAL
  Filled 2014-06-28: qty 15

## 2014-06-28 MED ORDER — CIPROFLOXACIN IN D5W 400 MG/200ML IV SOLN
INTRAVENOUS | Status: AC
  Filled 2014-06-28: qty 200

## 2014-06-28 MED ORDER — MEPERIDINE HCL 25 MG/ML IJ SOLN
6.2500 mg | INTRAMUSCULAR | Status: DC | PRN
  Filled 2014-06-28: qty 1

## 2014-06-28 SURGICAL SUPPLY — 26 items
BAG URINE DRAINAGE (UROLOGICAL SUPPLIES) ×1 IMPLANT
BAG URO CATCHER STRL LF (DRAPE) ×2 IMPLANT
CATH AINSWORTH 30CC 24FR (CATHETERS) IMPLANT
CATH FOLEY 3WAY 30CC 24FR (CATHETERS)
CATH URO 16X24FR 3W FL PS (CATHETERS) IMPLANT
CLOTH BEACON ORANGE TIMEOUT ST (SAFETY) ×2 IMPLANT
DRAPE CAMERA CLOSED 9X96 (DRAPES) ×2 IMPLANT
ELECT BUTTON HF 24-28F 2 30DE (ELECTRODE) ×2 IMPLANT
ELECT LOOP MED HF 24F 12D (CUTTING LOOP) ×2 IMPLANT
ELECT LOOP MED HF 24F 12D CBL (CLIP) ×2 IMPLANT
ELECT RESECT VAPORIZE 12D CBL (ELECTRODE) IMPLANT
GLOVE BIO SURGEON STRL SZ 6.5 (GLOVE) ×1 IMPLANT
GLOVE BIOGEL M STRL SZ7.5 (GLOVE) ×2 IMPLANT
GLOVE INDICATOR 6.5 STRL GRN (GLOVE) ×2 IMPLANT
GOWN STRL REUS W/ TWL LRG LVL3 (GOWN DISPOSABLE) IMPLANT
GOWN STRL REUS W/TWL LRG LVL3 (GOWN DISPOSABLE) ×4 IMPLANT
GOWN STRL REUS W/TWL XL LVL3 (GOWN DISPOSABLE) ×3 IMPLANT
HOLDER FOLEY CATH W/STRAP (MISCELLANEOUS) IMPLANT
IV NS IRRIG 3000ML ARTHROMATIC (IV SOLUTION) ×5 IMPLANT
KIT ASPIRATION TUBING (SET/KITS/TRAYS/PACK) ×2 IMPLANT
MANIFOLD NEPTUNE II (INSTRUMENTS) ×2 IMPLANT
NS IRRIG 1000ML POUR BTL (IV SOLUTION) ×2 IMPLANT
PACK CYSTO (CUSTOM PROCEDURE TRAY) ×2 IMPLANT
SYR 30ML LL (SYRINGE) IMPLANT
SYRINGE IRR TOOMEY STRL 70CC (SYRINGE) ×2 IMPLANT
TUBING CONNECTING 10 (TUBING) ×2 IMPLANT

## 2014-06-28 NOTE — Transfer of Care (Signed)
Immediate Anesthesia Transfer of Care Note  Patient: Randall Horton  Procedure(s) Performed: Procedure(s): TRANSURETHRAL RESECTION OF THE PROSTATE (TURP) (N/A)  Patient Location: PACU  Anesthesia Type:General  Level of Consciousness: awake and oriented  Airway & Oxygen Therapy: Patient Spontanous Breathing and Patient connected to nasal cannula oxygen  Post-op Assessment: Report given to PACU RN  Post vital signs: Reviewed and stable  Complications: No apparent anesthesia complications

## 2014-06-28 NOTE — Interval H&P Note (Signed)
History and Physical Interval Note:  06/28/2014 8:19 AM  Randall Horton  has presented today for surgery, with the diagnosis of BENIGN PROSTATIC HYPERPLASIA  The various methods of treatment have been discussed with the patient and family. After consideration of risks, benefits and other options for treatment, the patient has consented to  Procedure(s): TRANSURETHRAL RESECTION OF THE PROSTATE (TURP) (N/A) as a surgical intervention .  The patient's history has been reviewed, patient examined, no change in status, stable for surgery.  I have reviewed the patient's chart and labs.  Questions were answered to the patient's satisfaction.    Pt's surgery moved to [facility] because of main OR physical plant humidity problem, necessitating change from admission status to observation status.   Carolan Clines I

## 2014-06-28 NOTE — H&P (Signed)
Reason For Visit Cystoscopy & PUS   Active Problems Problems  1. Benign localized hyperplasia of prostate with urinary obstruction  (N40.1,N13.8)   Assessed By: Carolan Clines (Urology); Last Assessed: 15 May 2014 2. Incomplete bladder emptying (R33.9)   Assessed By: Carolan Clines (Urology); Last Assessed: 15 May 2014 3. Nocturia (R35.1)   Assessed By: Carolan Clines (Urology); Last Assessed: 15 May 2014 4. Urinary frequency (R35.0)   Assessed By: Carolan Clines (Urology); Last Assessed: 15 May 2014  History of Present Illness      69 YO diabetic male returns today for a cystoscopy & prostate u/s to consider options. He was last seen by Jimmey Ralph, NP on 04/13/14 for complaint of hip/perineal pain. Several weeks ago developed some hip pain and has seen orthopedist and has had steroid injections in hip. Pain improved temporarily but has returned. Recent MRI hip showed large prostate indenting bladder. He does feel a "golf ball in my rectum when I sit in my recliner." Denies hematuria or dysuria but does not feel he is emptying well. Has been off Tamsulosin for quite sometime.      Hx of progressively worsening nocturia, frequency, and urgency. Was started on Rapaflo 8 mg 1 po daily and given behavioral modifications. Med has been changed to tamsulosin (insurance), but is not having great success. He has had normal PSA per Dr.South. Has completed PT with last session 2013.    Had >75% improvement of stream with Rapaflo but no real improvement of right groin pain. Has significantly improvement caffeine intake. Takes Gabapentin 100 mg 2 po BID for chronic groin pain w/o improvement.     Is also concerned today about poor ejaculate he has had for years. PCP has been monitoring testosterone and was told it was w/in normal range but does feel it is <350. No change in ejaculate with Rapaflo. He is having ED and is concerned that he is getting  married and needs Viagra and insurance doesn't cover cost.   Past Medical History Problems  1. History of Arthritis 2. History of Gout (M10.9) 3. History of depression (Z86.59) 4. History of diabetes mellitus (Z86.39) 5. History of esophageal reflux (Z87.19) 6. History of hypercholesterolemia (Z86.39)  Surgical History Problems  1. History of Hernia Repair 2. History of Hernia Repair  Current Meds 1. Allopurinol 300 MG Oral Tablet;  Therapy: (Recorded:22Mar2013) to Recorded 2. Fenofibrate 160 MG Oral Tablet;  Therapy: (Recorded:22Mar2013) to Recorded 3. Fish Oil CAPS;  Therapy: (Recorded:22Mar2013) to Recorded 4. Gabapentin 100 MG Oral Capsule; 2 po BID;  Therapy: (Recorded:09Apr2013) to Recorded 5. Mens One Daily TABS;  Therapy: (Recorded:22Mar2013) to Recorded 6. NovoLOG 100 UNIT/ML Subcutaneous Solution;  Therapy: 29Dec2012 to Recorded 7. Omeprazole 20 MG Oral Capsule Delayed Release;  Therapy: (Recorded:22Mar2013) to Recorded 8. Tamsulosin HCl - 0.4 MG Oral Capsule; TAKE ONE CAPSULE BY  MOUTH DAILY;  Therapy: 17Apr2013 to (Evaluate:16Oct2016)  Requested for: 22Oct2015;  Last Rx:22Oct2015 Ordered 9. Vitamin D TABS;  Therapy: (Recorded:22Mar2013) to Recorded 10. Zolpidem Tartrate 10 MG Oral Tablet;   Therapy: (Recorded:22Mar2013) to Recorded  Allergies Medication  1. Horse-derived Products 2. Penicillins 3. Aspirin TABS 4. Niacin TABS  Family History Problems  1. Family history of Death In The Family Father 2. Family history of Death In The Family Mother 3. Family history of Diabetes Mellitus : Father 4. Family history of Family Health Status Number Of Children   1  son 5. Family history of Non-Hodgkin's Lymphoma : Father  Social History Problems  1. Activities Of Daily Living 2. Alcohol Use   not too often 3. Caffeine Use   3 per day 4. Current Smokeless Tobacco User 5. Exercise Habits   No regular exercise at this time but is quite active with  toes around the     house and is presently engaged in remodeling his home. 28. Former smoker [phone])   smoked 1ppd for 43yrs quit 4yrs ago 7. Living Independently 8. Marital History - Single 9. Married 10. Retired From Work 101. Self-reliant In Usual Daily Activities  Review of Systems Genitourinary, constitutional, skin, eye, otolaryngeal, hematologic/lymphatic, cardiovascular, pulmonary, endocrine, musculoskeletal, gastrointestinal, neurological and psychiatric system(s) were reviewed and pertinent findings if present are noted and are otherwise negative.  Genitourinary: incomplete emptying of bladder and perineal pain.  Musculoskeletal: joint pain.    Physical Exam Constitutional: Well nourished and well developed . No acute distress.  ENT:. The ears and nose are normal in appearance.  Neck: The appearance of the neck is normal and no neck mass is present.  Pulmonary: No respiratory distress and normal respiratory rhythm and effort.  Cardiovascular: Heart rate and rhythm are normal . No peripheral edema.  Abdomen: The abdomen is rounded. The abdomen is soft and nontender. No masses are palpated. No CVA tenderness. No hernias are palpable. No hepatosplenomegaly noted.  Rectal: Rectal exam demonstrates normal sphincter tone, no tenderness and no masses. Estimated prostate size is 3+. The prostate has no nodularity and is not tender. The left seminal vesicle is nonpalpable. The right seminal vesicle is nonpalpable. The perineum is normal on inspection.  Genitourinary: Examination of the penis demonstrates no discharge, no masses, no lesions and a normal meatus. The penis is uncircumcised. The scrotum is normal in appearance and without lesions. The right epididymis is palpably normal and non-tender. The left epididymis is palpably normal and non-tender. The right testis is non-tender and without masses. The left testis is non-tender and without masses.  Lymphatics: The femoral and inguinal  nodes are not enlarged or tender.  Skin: Normal skin turgor, no visible rash and no visible skin lesions.  Neuro/Psych:. Mood and affect are appropriate.    Results/Data Urine [Data Includes: Last 1 Day]   [id number]  COLOR YELLOW   APPEARANCE CLEAR   SPECIFIC GRAVITY 1.015   pH 5.5   GLUCOSE NEG mg/dL  BILIRUBIN NEG   KETONE NEG mg/dL  BLOOD NEG   PROTEIN NEG mg/dL  UROBILINOGEN 0.2 mg/dL  NITRITE NEG   LEUKOCYTE ESTERASE NEG    Procedure Prostate u/s today: Length - 4.71cm, Height - 3.93cm, Width - 5.45cm. Total volume - 52.88 grams. Calcifications noted. Possible septated cyst at sag mid (unable to visualize on transverse plane), measures 0.72 x 0.57cm.   Procedure: Cystoscopy  Chaperone Present: kim lewis.  Indication: Lower Urinary Tract Symptoms.  Informed Consent: Risks, benefits, and potential adverse events were discussed and informed consent was obtained from the patient.  Prep: The patient was prepped with betadine.  Anesthesia:. Local anesthesia was administered intraurethrally with 2% lidocaine jelly.  Antibiotic prophylaxis: Ciprofloxacin.  Procedure Note:  Urethral meatus:. No abnormalities.  Anterior urethra: No abnormalities.  Prostatic urethra:. The lateral and median prostatic lobes were enlarged. An enlarged intravesical median lobe was visualized.  Bladder: Visulization was clear. Examination of the bladder demonstrated trabeculation, but no clot within the bladder edema located at the neck of the bladder , but no erythematous mucosa and  no cellules.    Assessment Assessed  1. Benign localized hyperplasia of prostate with urinary obstruction  (N40.1,N13.8) 2. Incomplete bladder emptying (R33.9) 3. Nocturia (R35.1) 4. Urinary frequency (R35.0)  BPH with enlargement of the L lateral lobe of the prostate with enlarged median lobe and "blll-valve" effect.   Plan Health Maintenance  1. UA With REFLEX; [Do Not Release]; Status:Resulted - Requires   Verification;   Done: [id number] 10:57AM  Schedule TURP   Discussion/Summary cc: Dr. Ollen Barges

## 2014-06-28 NOTE — Progress Notes (Signed)
Inpatient Diabetes Program Recommendations  AACE/ADA: New Consensus Statement on Inpatient Glycemic Control (2013)  Target Ranges:  Prepandial:   less than 140 mg/dL      Peak postprandial:   less than 180 mg/dL (1-2 hours)      Critically ill patients:  140 - 180 mg/dL   Reason for Visit: Insulin Pump  Diabetes history: DM2 Outpatient Diabetes medications: Insulin Pump Current orders for Inpatient glycemic control: Insulin Pump + Novolog resistant tidwc  Pt is 69 year old IDDM with insulin pump s/p TURP. Endo is Dr. Forde Dandy. Has had pump for 10+ years. Last HgbA1C <7.0%.  Insulin Pump settings: Basal -  12 - 0200 - 2.2 units 0200 - 0700 - 2.9 units 0700 - 0900 - 2.2 units 0900 - 1700 - 2.6 units 1700 - 2100 - 2.9 units 2100 - 2400 - 2.6 units Bolus -  Goal - 100-120 CHO ratio - 1:6 CF - 25  Discussed need to d/c Novolog resistant orders. RN to call MD. Pt has supplies and very knowledgeable regarding insulin pump.  Will follow. Thank you. Lorenda Peck, RD, LDN, CDE Inpatient Diabetes Coordinator [phone]

## 2014-06-28 NOTE — Progress Notes (Signed)
At 1310 pt gave himself a 3.4 bolus.  At 13:53 pt gave himself a bolus of 7 units.  At 1400 pt gave himself a bolus of 6.6 units.  At 1430 pt blood glucose was 145.  Will continue to monitor closely.

## 2014-06-28 NOTE — Anesthesia Postprocedure Evaluation (Signed)
  Anesthesia Post-op Note  Patient: Randall Horton  Procedure(s) Performed: Procedure(s) (LRB): TRANSURETHRAL RESECTION OF THE PROSTATE (TURP) (N/A)  Patient Location: PACU  Anesthesia Type: General  Level of Consciousness: awake and alert   Airway and Oxygen Therapy: Patient Spontanous Breathing  Post-op Pain: mild  Post-op Assessment: Post-op Vital signs reviewed, Patient's Cardiovascular Status Stable, Respiratory Function Stable, Patent Airway and No signs of Nausea or vomiting  Last Vitals:  Filed Vitals:   06/28/14 1157  BP: 134/64  Pulse: 89  Temp: 36.8 C  Resp: 20    Post-op Vital Signs: stable   Complications: No apparent anesthesia complications

## 2014-06-28 NOTE — Anesthesia Preprocedure Evaluation (Addendum)
Anesthesia Evaluation  Patient identified by MRN, date of birth, ID band Patient awake    Reviewed: Allergy & Precautions, NPO status , Patient's Chart, lab work & pertinent test results  Airway Mallampati: II  TM Distance: >3 FB Neck ROM: Full    Dental no notable dental hx.    Pulmonary neg pulmonary ROS, former smoker,  breath sounds clear to auscultation  Pulmonary exam normal       Cardiovascular negative cardio ROS  Rhythm:Regular Rate:Normal     Neuro/Psych negative neurological ROS  negative psych ROS   GI/Hepatic negative GI ROS, Neg liver ROS, hiatal hernia, GERD-  Medicated and Controlled,  Endo/Other  negative endocrine ROSdiabetes, Well Controlled, Type 1, Insulin DependentInsulin pump  Renal/GU negative Renal ROS  negative genitourinary   Musculoskeletal negative musculoskeletal ROS (+)   Abdominal   Peds negative pediatric ROS (+)  Hematology negative hematology ROS (+)   Anesthesia Other Findings   Reproductive/Obstetrics negative OB ROS                            Anesthesia Physical Anesthesia Plan  ASA: II  Anesthesia Plan: General   Post-op Pain Management:    Induction: Intravenous  Airway Management Planned: LMA  Additional Equipment:   Intra-op Plan:   Post-operative Plan: Extubation in OR  Informed Consent: I have reviewed the patients History and Physical, chart, labs and discussed the procedure including the risks, benefits and alternatives for the proposed anesthesia with the patient or authorized representative who has indicated his/her understanding and acceptance.   Dental advisory given  Plan Discussed with: CRNA  Anesthesia Plan Comments:         Anesthesia Quick Evaluation

## 2014-06-28 NOTE — Anesthesia Procedure Notes (Signed)
Procedure Name: LMA Insertion Date/Time: 06/28/2014 9:00 AM Performed by: Bethena Roys T Pre-anesthesia Checklist: Patient identified, Emergency Drugs available, Suction available and Patient being monitored Patient Re-evaluated:Patient Re-evaluated prior to inductionOxygen Delivery Method: Circle System Utilized Preoxygenation: Pre-oxygenation with 100% oxygen Intubation Type: IV induction Ventilation: Mask ventilation without difficulty LMA: LMA with gastric port inserted LMA Size: 5.0 Number of attempts: 1 Airway Equipment and Method: bite block Placement Confirmation: positive ETCO2 Tube secured with: Tape Dental Injury: Teeth and Oropharynx as per pre-operative assessment

## 2014-06-28 NOTE — Op Note (Signed)
Pre-operative diagnosis : BPH with significant lower urinary tract symptoms, and failure of medical therapy  Postoperative diagnosis:  Same  Operation:  Cystourethroscopy, gyrus transurethral resection of the prostate  Surgeon:  S. Gaynelle Arabian, MD  First assistant:  None  Anesthesia:  General LMA  Preparation:  After appropriate preanesthesia, the patient was brought to the operating room, placed on the operating table in the dorsal supine position where general LMA anesthesia was introduced. He remain in this position when the pubis was prepped with Betadine solution and draped in the usual fashion. The history was double checked. The arm band was double checked.  Review history:  Problems  1. Benign localized hyperplasia of prostate with urinary obstruction (N40.1,N13.8)  Assessed By: Carolan Clines (Urology); Last Assessed: 15 May 2014 2. Incomplete bladder emptying (R33.9)  Assessed By: Carolan Clines (Urology); Last Assessed: 15 May 2014 3. Nocturia (R35.1)  Assessed By: Carolan Clines (Urology); Last Assessed: 15 May 2014 4. Urinary frequency (R35.0)  Assessed By: Carolan Clines (Urology); Last Assessed: 15 May 2014  History of Present Illness    69 YO diabetic male returns today for a cystoscopy & prostate u/s to consider options. He was last seen by Jimmey Ralph, NP on 04/13/14 for complaint of hip/perineal pain. Several weeks ago developed some hip pain and has seen orthopedist and has had steroid injections in hip. Pain improved temporarily but has returned. Recent MRI hip showed large prostate indenting bladder. He does feel a "golf ball in my rectum when I sit in my recliner." Denies hematuria or dysuria but does not feel he is emptying well. Has been off Tamsulosin for quite sometime.     Hx of progressively worsening nocturia, frequency, and urgency. Was started on Rapaflo 8 mg 1 po daily and given behavioral  modifications. Med has been changed to tamsulosin (insurance), but is not having great success. He has had normal PSA per Dr.South. Has completed PT with last session 2013.    Had >75% improvement of stream with Rapaflo but no real improvement of right groin pain. Has significantly improvement caffeine intake. Takes Gabapentin 100 mg 2 po BID for chronic groin pain w/o improvement.     Is also concerned today about poor ejaculate he has had for years. PCP has been monitoring testosterone and was told it was w/in normal range but does feel it is <350. No change in ejaculate with Rapaflo. He is having ED and is concerned that he is getting married and needs Viagra and insurance doesn't cover cost.   Statement of  Likelihood of Success: Excellent. TIME-OUT observed.:  Procedure:  Direct vision cystoscopy revealed normal appearing urethra. The prostate was enlarged with trilobar BPH, with particular enlargement of the left lateral lobe, fusing with the median lobe. Within the ladder, the trigone was identified, with normal ureteral orifices. Trabeculation was identified. No cellules were identified. There was no evidence of diverticula, bladder stone, or tumor.  Resection was accomplished at the 7:00 position, with resection from the 7:00 to the 5:00 position to resect the median lobe. After this resection, a large portion of the left lateral lobe fell into the outlet, was felt to be in need of resection. Therefore, the left lateral lobe was resected from the 1:00 to the 5:00 position, leaving the 0 in position for the entire procedure. The right lateral lobe was resected from the 10:00 position, to the 7:00 position. Chips were evacuated free from the bladder and sent to laboratory for examination.  Cauterization was accomplished so that no bleeding was noted. A size 22 French, 30 mL balloon, hematuria catheter was passed. Slight traction was placed, and CBI was added for when necessary use. The patient  was given a B and O suppository at the beginning of procedure, along with IV antibiotic. He received IV Toradol, and IV Tylenol at the end of the procedure. He was awakened, and taken to recovery room in good condition.

## 2014-06-29 ENCOUNTER — Encounter (HOSPITAL_BASED_OUTPATIENT_CLINIC_OR_DEPARTMENT_OTHER): Payer: Self-pay | Admitting: Urology

## 2014-06-29 DIAGNOSIS — N401 Enlarged prostate with lower urinary tract symptoms: Secondary | ICD-10-CM | POA: Diagnosis not present

## 2014-06-29 LAB — HEMOGLOBIN AND HEMATOCRIT, BLOOD
HCT: 38 % — ABNORMAL LOW (ref 39.0–52.0)
Hemoglobin: 12.5 g/dL — ABNORMAL LOW (ref 13.0–17.0)

## 2014-06-29 LAB — BASIC METABOLIC PANEL
Anion gap: 7 (ref 5–15)
BUN: 18 mg/dL (ref 6–23)
CO2: 27 mmol/L (ref 19–32)
Calcium: 8.2 mg/dL — ABNORMAL LOW (ref 8.4–10.5)
Chloride: 98 mEq/L (ref 96–112)
Creatinine, Ser: 1.52 mg/dL — ABNORMAL HIGH (ref 0.50–1.35)
GFR calc Af Amer: 52 mL/min — ABNORMAL LOW (ref >90)
GFR calc non Af Amer: 45 mL/min — ABNORMAL LOW (ref >90)
Glucose, Bld: 139 mg/dL — ABNORMAL HIGH (ref 70–99)
Potassium: 4.5 mmol/L (ref 3.5–5.1)
Sodium: 132 mmol/L — ABNORMAL LOW (ref 135–145)

## 2014-06-29 LAB — GLUCOSE, CAPILLARY
Glucose-Capillary: 128 mg/dL — ABNORMAL HIGH (ref 70–99)
Glucose-Capillary: 175 mg/dL — ABNORMAL HIGH (ref 70–99)

## 2014-06-29 MED ORDER — OXYCODONE-ACETAMINOPHEN 5-325 MG PO TABS: 1.0000 | ORAL_TABLET | ORAL | Status: DC | PRN

## 2014-06-29 MED ORDER — BACITRACIN-NEOMYCIN-POLYMYXIN 400-5-5000 EX OINT: 1.0000 "application " | TOPICAL_OINTMENT | Freq: Two times a day (BID) | CUTANEOUS | Status: DC

## 2014-06-29 MED ORDER — PHENAZOPYRIDINE HCL 200 MG PO TABS: 200.0000 mg | ORAL_TABLET | Freq: Three times a day (TID) | ORAL | Status: DC | PRN

## 2014-06-29 NOTE — Discharge Instructions (Signed)
Post transurethral resection of the prostate (TURP) instructions ° °Your recent prostate surgery requires very special post hospital care. Despite the fact that no skin incisions were used the area around the prostate incision is quite raw and is covered with a scab to promote healing and prevent bleeding. Certain cautions are needed to assure that the scab is not disturbed of the next 2-3 weeks while the healing proceeds. ° °Because the raw surface in your prostate and the irritating effects of urine you may expect frequency of urination and/or urgency (a stronger desire to urinate) and perhaps even getting up at night more often. This will usually resolve or improve slowly over the healing period. You may see some blood in your urine over the first 6 weeks. Do not be alarmed, even if the urine was clear for a while. Get off your feet and drink lots of fluids until clearing occurs. If you start to pass clots or don't improve call us. ° °Diet: ° °You may return to your normal diet immediately. Because of the raw surface of your bladder, alcohol, spicy foods, foods high in acid and drinks with caffeine may cause irritation or frequency and should be used in moderation. To keep your urine flowing freely and avoid constipation, drink plenty of fluids during the day (8-10 glasses). Tip: Avoid cranberry juice because it is very acidic. ° °Activity: ° °Your physical activity doesn't need to be restricted. However, if you are very active, you may see some blood in the urine. We suggest that you reduce your activity under the circumstances until the bleeding has stopped. ° °Bowels: ° °It is important to keep your bowels regular during the postoperative period. Straining with bowel movements can cause bleeding. A bowel movement every other day is reasonable. Use a mild laxative if needed, such as milk of magnesia 2-3 tablespoons, or 2 Dulcolax tablets. Call if you continue to have problems. If you had been taking narcotics  for pain, before, during or after your surgery, you may be constipated. Take a laxative if necessary. ° °Medication: ° °You should resume your pre-surgery medications unless told not to. In addition you may be given an antibiotic to prevent or treat infection. Antibiotics are not always necessary. All medication should be taken as prescribed until the bottles are finished unless you are having an unusual reaction to one of the drugs. ° ° ° ° °Problems you should report to us: ° °a. Fever greater than 101°F. °b. Heavy bleeding, or clots (see notes above about blood in urine). °c. Inability to urinate. °d. Drug reactions (hives, rash, nausea, vomiting, diarrhea). °e. Severe burning or pain with urination that is not improving. ° °

## 2014-06-29 NOTE — Progress Notes (Signed)
Urology Progress Note  1 Day Post-Op   Subjective: AF, VSS, uriine clear, with pink-tinge. Pt desires d/c with foley , for RTC Monday AM for foley removal.    No acute urologic events overnight. Ambulation:   positive Flatus:    positive Bowel movement  negative  Pain: complete resolution  Objective:  Blood pressure 144/62, pulse 94, temperature 98.1 F (36.7 C), temperature source Oral, resp. rate 18, height 5\' 11"  (1.803 m), weight 95.709 kg (211 lb), SpO2 95 %.  Physical Exam:  General:  No acute distress, awake Extremities: extremities normal, atraumatic, no cyanosis or edema Genitourinary:  Normal penis, scrotum Foley: remains. No traction.     I/O last 3 completed shifts: In: 7055 [P.O.:360; I.V.:1195; Other:5500] Out: 9500 [Urine:9500]  Recent Labs     06/28/14  0738  06/29/14  0414  HGB  14.3  12.5*    Recent Labs     06/28/14  0738  06/29/14  0414  NA  140  132*  K  4.1  4.5  CL   --   98  CO2   --   27  BUN   --   18  CREATININE   --   1.52*  CALCIUM   --   8.2*  GFRNONAA   --   45*  GFRAA   --   52*     No results for input(s): INR, APTT in the last 72 hours.  Invalid input(s): PT   Invalid input(s): ABG  Assessment/Plan:  Catheter not removed. Follow up Monday AM for voiding trial. D/c meds: Cipro, Urised, Neosporin to catheter.

## 2014-06-29 NOTE — Progress Notes (Signed)
At 10 pm pt  states he did not give himself any insulin pump. Last was at 7 pm. Will monitor closely.

## 2014-06-29 NOTE — Discharge Summary (Signed)
Physician Discharge Summary  Patient ID: Randall Horton MRN: [id number] DOB/AGE: 1946/03/15 69 y.o.  Admit date: 06/28/2014 Discharge date: 06/29/2014  Admission Diagnoses: BENIGN PROSTATIC HYPERPLASIA  Discharge Diagnoses:  Active Problems:   Benign hypertrophy of prostate   Discharged Condition: stable  Hospital Course: TURP 01.07/16  Significant Diagnostic Studies: No results found.  Discharge Exam: Blood pressure 144/62, pulse 94, temperature 98.1 F (36.7 C), temperature source Oral, resp. rate 18, height 5\' 11"  (1.803 m), weight 95.709 kg (211 lb), SpO2 95 %.   Disposition:   Discharge Instructions    Continue foley catheter    Complete by:  As directed   D/c CBI     Discharge patient    Complete by:  As directed      Discontinue IV    Complete by:  As directed             Medication List    TAKE these medications        acetaminophen 500 MG tablet  Commonly known as:  TYLENOL  Take 1,000 mg by mouth every 6 (six) hours as needed for moderate pain or headache.     allopurinol 300 MG tablet  Commonly known as:  ZYLOPRIM  Take 300 mg by mouth every morning.     clobetasol cream 0.05 %  Commonly known as:  TEMOVATE  Apply 1 application topically daily as needed (rash on chin.).     clotrimazole 10 MG troche  Commonly known as:  MYCELEX  Take 10 mg by mouth 5 (five) times daily. As needed for thrush outbreak.     diphenhydrAMINE 25 MG tablet  Commonly known as:  BENADRYL  Take 25 mg by mouth at bedtime as needed for itching (around pump site.).     fenofibrate 160 MG tablet  Take 160 mg by mouth every evening.     gabapentin 100 MG capsule  Commonly known as:  NEURONTIN  Take 200 mg by mouth 2 (two) times daily.     HYDROcodone-acetaminophen 5-325 MG per tablet  Commonly known as:  NORCO/VICODIN  Take 1 tablet by mouth 2 (two) times daily as needed for moderate pain.     ibuprofen 200 MG tablet  Commonly known as:  ADVIL,MOTRIN  Take 400 mg  by mouth every 6 (six) hours as needed for headache or moderate pain.     insulin pump Soln  Inject 110-120 each into the skin daily. Novolog.     loratadine 10 MG tablet  Commonly known as:  CLARITIN  Take 10 mg by mouth daily as needed for allergies.     montelukast 10 MG tablet  Commonly known as:  SINGULAIR  Take 10 mg by mouth every evening.     MUCINEX 600 MG 12 hr tablet  Generic drug:  guaiFENesin  Take 600-1,200 mg by mouth 2 (two) times daily as needed for cough or to loosen phlegm.     neomycin-bacitracin-polymyxin ointment  Commonly known as:  NEOSPORIN  Apply 1 application topically 2 (two) times daily. Apply to penile meatus 2x/day     omeprazole 20 MG tablet  Commonly known as:  PRILOSEC OTC  Take 20 mg by mouth every morning.     oxyCODONE-acetaminophen 5-325 MG per tablet  Commonly known as:  ROXICET  Take 1 tablet by mouth every 4 (four) hours as needed for severe pain.     phenazopyridine 200 MG tablet  Commonly known as:  PYRIDIUM  Take 1 tablet (200 mg total)  by mouth 3 (three) times daily as needed for pain. Note: will turn urine orange, and stain clothing     tamsulosin 0.4 MG Caps capsule  Commonly known as:  FLOMAX  Take 0.4 mg by mouth every morning.     zolpidem 10 MG tablet  Commonly known as:  AMBIEN  Take 10 mg by mouth at bedtime.           Follow-up Information    Follow up with Carolan Clines I, MD. Schedule an appointment as soon as possible for a visit in 3 days.   Specialty:  Urology   Why:  call Kim at [phone] for qppointment for voiding trial for Monday Morning   Contact information:   [address] [place] [address] [phone]     RTC Monday AM for voiding trial.   Signed: Carolan Clines I 06/29/2014, 8:31 AM

## 2014-06-29 NOTE — Progress Notes (Signed)
PT did not give himself 0200 bolus due to CBG being 128. Will monitor closely.

## 2014-06-29 NOTE — Progress Notes (Signed)
UR completed 

## 2014-10-17 ENCOUNTER — Other Ambulatory Visit: Payer: Self-pay | Admitting: Urology

## 2014-10-31 ENCOUNTER — Encounter (HOSPITAL_BASED_OUTPATIENT_CLINIC_OR_DEPARTMENT_OTHER): Payer: Self-pay | Admitting: *Deleted

## 2014-10-31 NOTE — Progress Notes (Signed)
NPO AFTER MN, INCLUDING NO DIP TOBACCO. PT VERBALIZED UNDERSTANDING OF THIS.  ARRIVE AT  0900.  NEEDS ISTAT.  CURRENT EKG AND CXR IN CHART AND EPIC.  WILL TAKE AM MEDS DOS W/ SIPS OF WATER.

## 2014-11-01 NOTE — H&P (Signed)
Reason For Visit 3 mo f/u   Active Problems Problems  1. Benign localized hyperplasia of prostate with urinary obstruction (N40.1,N13.8)   Assessed By: Carolan Clines (Urology); Last Assessed: 15 Oct 2014 2. Postoperative urethral stricture   Assessed By: Carolan Clines (Urology); Last Assessed: 15 Oct 2014 3. Urinary frequency (R35.0)   Assessed By: Carolan Clines (Urology); Last Assessed: 15 Oct 2014  History of Present Illness 69 YO diabetic male returns today for a 3 mo f/u with a hx of BPH, now off Tamsulosin. He is s/p cystourethroscopy, [facility] TUR-P 06/28/14.Off tamsulosin since surgery. Feels that he has some urine spray, with sensation of obstruction behind head of penis.     Pathology: benign prostatic hyperplasia. No tumor seen.    MRI hip showed large prostate indenting bladder. He does feel a "golf ball in my rectum when I sit in my recliner." Denies hematuria or dysuria but does not feel he is emptying well. Has been off Tamsulosin for quite sometime.      Hx of progressively worsening nocturia, frequency, and urgency. Was started on Rapaflo 8 mg 1 po daily and given behavioral modifications. Med has been changed to tamsulosin (insurance), but is not having great success. He has had normal PSA per Dr.South. Has completed PT with last session 2013.    Had >75% improvement of stream with Rapaflo but no real improvement of right groin pain. Has significantly improvement caffeine intake. Takes Gabapentin 100 mg 2 po BID for chronic groin pain w/o improvement.     Is also concerned today about poor ejaculate he has had for years. PCP has been monitoring testosterone and was told it was w/in normal range but does feel it is <350. No change in ejaculate with Rapaflo. He is having ED and is concerned that he is getting married and needs Viagra and insurance doesn't cover cost.   Past Medical History Problems  1. History of Arthritis 2. History of Gout  (M10.9) 3. History of depression (Z86.59) 4. History of diabetes mellitus (Z86.39) 5. History of esophageal reflux (Z87.19) 6. History of hypercholesterolemia (Z86.39)  Surgical History Problems  1. History of Hernia Repair 2. History of Hernia Repair 3. History of Transurethral Resection Of Prostate (TURP)  Current Meds 1. Allopurinol 300 MG Oral Tablet;  Therapy: (Recorded:22Mar2013) to Recorded 2. Fenofibrate 160 MG Oral Tablet;  Therapy: (Recorded:22Mar2013) to Recorded 3. Gabapentin 100 MG Oral Capsule; 2 po BID;  Therapy: (Recorded:09Apr2013) to Recorded 4. NovoLOG 100 UNIT/ML Subcutaneous Solution;  Therapy: 29Dec2012 to Recorded 5. Omeprazole 20 MG Oral Capsule Delayed Release;  Therapy: (Recorded:22Mar2013) to Recorded 6. Pyridium TABS;  Therapy: (Recorded:14Jan2016) to Recorded 7. Zolpidem Tartrate 10 MG Oral Tablet;  Therapy: (Recorded:22Mar2013) to Recorded  Allergies Medication  1. Horse-derived Products 2. Penicillins 3. Aspirin TABS 4. Niacin TABS  Family History Problems  1. Family history of Death In The Family Father 2. Family history of Death In The Family Mother 3. Family history of Diabetes Mellitus : Father 4. Family history of Family Health Status Number Of Children   1 son 5. Family history of Non-Hodgkin's Lymphoma : Father  Social History Problems  1. Activities Of Daily Living 2. Alcohol Use   not too often 3. Caffeine Use   3 per day 4. Current Smokeless Tobacco User 5. Exercise Habits   No regular exercise at this time but is quite active with toes around the house and is     presently engaged in remodeling his home. 6. Former smoker [phone])  smoked 1ppd for 71yrs quit 23yrs ago 7. Living Independently 8. Marital History - Single 9. Married 10. Retired From Work 12. Self-reliant In Usual Daily Activities  Review of Systems Genitourinary, constitutional, skin, eye, otolaryngeal, hematologic/lymphatic, cardiovascular,  pulmonary, endocrine, musculoskeletal, gastrointestinal, neurological and psychiatric system(s) were reviewed and pertinent findings if present are noted and are otherwise negative.  Genitourinary: incomplete emptying of bladder.  Musculoskeletal: joint pain.    Vitals Vital Signs [Data Includes: Last 1 Day]  Recorded: 25Apr2016 01:01PM  Blood Pressure: 134 / 75 Temperature: 97.8 F Heart Rate: 109  Physical Exam Constitutional: Well nourished and well developed . No acute distress.  ENT:. The ears and nose are normal in appearance.  Neck: The appearance of the neck is normal and no neck mass is present.  Cardiovascular: Heart rate and rhythm are normal . No peripheral edema.  Abdomen: The abdomen is soft and nontender. No masses are palpated. No CVA tenderness. No hernias are palpable. No hepatosplenomegaly noted.  Rectal: Rectal exam demonstrates normal sphincter tone, no tenderness and no masses. Estimated prostate size is 3+. The prostate has no nodularity and is not tender. The left seminal vesicle is nonpalpable. The right seminal vesicle is nonpalpable. The perineum is normal on inspection.  Genitourinary: Examination of the penis demonstrates no discharge, no masses, no lesions and a normal meatus. The scrotum is without lesions. The right epididymis is palpably normal and non-tender. The left epididymis is palpably normal and non-tender. The right testis is non-tender and without masses. The left testis is non-tender and without masses.  Lymphatics: The femoral and inguinal nodes are not enlarged or tender.  Skin: Normal skin turgor, no visible rash and no visible skin lesions.  Neuro/Psych:. Mood and affect are appropriate.    Results/Data Urine [Data Includes: Last 1 Day]   25Apr2016  COLOR YELLOW   APPEARANCE CLEAR   SPECIFIC GRAVITY 1.020   pH 5.5   GLUCOSE NEG mg/dL  BILIRUBIN NEG   KETONE NEG mg/dL  BLOOD NEG   PROTEIN NEG mg/dL  UROBILINOGEN 0.2 mg/dL  NITRITE NEG    LEUKOCYTE ESTERASE NEG    Assessment Assessed  1. Urinary frequency (R35.0) 2. Postoperative urethral stricture 3. Benign localized hyperplasia of prostate with urinary obstruction (N40.1,N13.8)  Most probably has obstruction 2ndary to urethral stricture behind the glans. He will need cysto, and urethral dilation with anesthesia.   Plan Health Maintenance  1. UA With REFLEX; [Do Not Release]; Status:Resulted - Requires Verification;   Done:  25Apr2016 12:32PM  cysto, uethral dilation. Possible cook balloon dilation.   Discussion/Summary cc: Dr. Forde Dandy     Signatures Electronically signed by : Carolan Clines, M.D.; Oct 15 2014  1:45PM EST

## 2014-11-02 ENCOUNTER — Encounter (HOSPITAL_BASED_OUTPATIENT_CLINIC_OR_DEPARTMENT_OTHER)
Admission: RE | Disposition: A | Discharge status: Home / Self Care | Payer: Self-pay | Source: Ambulatory Visit | Attending: Urology

## 2014-11-02 ENCOUNTER — Ambulatory Visit (HOSPITAL_BASED_OUTPATIENT_CLINIC_OR_DEPARTMENT_OTHER): Payer: Medicare HMO | Admitting: Anesthesiology

## 2014-11-02 ENCOUNTER — Ambulatory Visit (HOSPITAL_BASED_OUTPATIENT_CLINIC_OR_DEPARTMENT_OTHER)
Admission: RE | Admit: 2014-11-02 | Discharge: 2014-11-02 | Disposition: A | Discharge status: Home / Self Care | Payer: Medicare HMO | Source: Ambulatory Visit | Attending: Urology | Admitting: Urology

## 2014-11-02 ENCOUNTER — Encounter (HOSPITAL_BASED_OUTPATIENT_CLINIC_OR_DEPARTMENT_OTHER): Payer: Self-pay | Admitting: Anesthesiology

## 2014-11-02 DIAGNOSIS — N401 Enlarged prostate with lower urinary tract symptoms: Secondary | ICD-10-CM | POA: Insufficient documentation

## 2014-11-02 DIAGNOSIS — N359 Urethral stricture, unspecified: Secondary | ICD-10-CM | POA: Insufficient documentation

## 2014-11-02 DIAGNOSIS — Z87891 Personal history of nicotine dependence: Secondary | ICD-10-CM | POA: Diagnosis not present

## 2014-11-02 DIAGNOSIS — Z888 Allergy status to other drugs, medicaments and biological substances status: Secondary | ICD-10-CM | POA: Diagnosis not present

## 2014-11-02 DIAGNOSIS — Z880 Allergy status to penicillin: Secondary | ICD-10-CM | POA: Diagnosis not present

## 2014-11-02 DIAGNOSIS — R350 Frequency of micturition: Secondary | ICD-10-CM | POA: Insufficient documentation

## 2014-11-02 DIAGNOSIS — K219 Gastro-esophageal reflux disease without esophagitis: Secondary | ICD-10-CM | POA: Insufficient documentation

## 2014-11-02 DIAGNOSIS — M109 Gout, unspecified: Secondary | ICD-10-CM | POA: Diagnosis not present

## 2014-11-02 DIAGNOSIS — Z881 Allergy status to other antibiotic agents status: Secondary | ICD-10-CM | POA: Diagnosis not present

## 2014-11-02 DIAGNOSIS — F329 Major depressive disorder, single episode, unspecified: Secondary | ICD-10-CM | POA: Insufficient documentation

## 2014-11-02 DIAGNOSIS — E780 Pure hypercholesterolemia: Secondary | ICD-10-CM | POA: Diagnosis not present

## 2014-11-02 DIAGNOSIS — E119 Type 2 diabetes mellitus without complications: Secondary | ICD-10-CM | POA: Insufficient documentation

## 2014-11-02 DIAGNOSIS — N99114 Postprocedural urethral stricture, male, unspecified: Secondary | ICD-10-CM

## 2014-11-02 HISTORY — DX: Other specified personal risk factors, not elsewhere classified: Z91.89

## 2014-11-02 HISTORY — DX: Presence of dental prosthetic device (complete) (partial): Z97.2

## 2014-11-02 HISTORY — DX: Retention of urine, unspecified: R33.9

## 2014-11-02 HISTORY — DX: Cough: R05

## 2014-11-02 HISTORY — DX: Presence of spectacles and contact lenses: Z97.3

## 2014-11-02 HISTORY — DX: Chronic cough: R05.3

## 2014-11-02 HISTORY — DX: Personal history of other endocrine, nutritional and metabolic disease: Z86.39

## 2014-11-02 HISTORY — DX: Urgency of urination: R39.15

## 2014-11-02 HISTORY — PX: CYSTOSCOPY WITH URETHRAL DILATATION: SHX5125

## 2014-11-02 LAB — POCT I-STAT 4, (NA,K, GLUC, HGB,HCT)
Glucose, Bld: 88 mg/dL (ref 65–99)
HCT: 40 % (ref 39.0–52.0)
Hemoglobin: 13.6 g/dL (ref 13.0–17.0)
Potassium: 4.4 mmol/L (ref 3.5–5.1)
Sodium: 142 mmol/L (ref 135–145)

## 2014-11-02 LAB — GLUCOSE, CAPILLARY: Glucose-Capillary: 102 mg/dL — ABNORMAL HIGH (ref 65–99)

## 2014-11-02 SURGERY — CYSTOSCOPY, WITH URETHRAL DILATION
Anesthesia: General | Site: Urethra

## 2014-11-02 MED ORDER — ONDANSETRON HCL 4 MG/2ML IJ SOLN
INTRAMUSCULAR | Status: DC | PRN
  Administered 2014-11-02: 4 mg via INTRAVENOUS

## 2014-11-02 MED ORDER — CIPROFLOXACIN IN D5W 400 MG/200ML IV SOLN
400.0000 mg | INTRAVENOUS | Status: AC
  Administered 2014-11-02: 400 mg via INTRAVENOUS
  Filled 2014-11-02: qty 200

## 2014-11-02 MED ORDER — HYDROMORPHONE HCL 1 MG/ML IJ SOLN
0.5000 mg | INTRAMUSCULAR | Status: DC | PRN
  Filled 2014-11-02: qty 1

## 2014-11-02 MED ORDER — LACTATED RINGERS IV SOLN
INTRAVENOUS | Status: DC
  Administered 2014-11-02: 10:00:00 via INTRAVENOUS
  Filled 2014-11-02: qty 1000

## 2014-11-02 MED ORDER — PROPOFOL 10 MG/ML IV BOLUS
INTRAVENOUS | Status: DC | PRN
  Administered 2014-11-02: 150 mg via INTRAVENOUS

## 2014-11-02 MED ORDER — ONDANSETRON HCL 4 MG/2ML IJ SOLN
4.0000 mg | Freq: Once | INTRAMUSCULAR | Status: DC | PRN
  Filled 2014-11-02: qty 2

## 2014-11-02 MED ORDER — PHENAZOPYRIDINE HCL 200 MG PO TABS: 200.0000 mg | ORAL_TABLET | Freq: Three times a day (TID) | ORAL | Status: DC | PRN

## 2014-11-02 MED ORDER — CEFAZOLIN SODIUM-DEXTROSE 2-3 GM-% IV SOLR
INTRAVENOUS | Status: AC
  Filled 2014-11-02: qty 50

## 2014-11-02 MED ORDER — LIDOCAINE HCL (CARDIAC) 20 MG/ML IV SOLN
INTRAVENOUS | Status: DC | PRN
  Administered 2014-11-02: 70 mg via INTRAVENOUS

## 2014-11-02 MED ORDER — FENTANYL CITRATE (PF) 100 MCG/2ML IJ SOLN
INTRAMUSCULAR | Status: AC
  Filled 2014-11-02: qty 2

## 2014-11-02 MED ORDER — FENTANYL CITRATE (PF) 100 MCG/2ML IJ SOLN
INTRAMUSCULAR | Status: DC | PRN
  Administered 2014-11-02 (×2): 50 ug via INTRAVENOUS

## 2014-11-02 MED ORDER — STERILE WATER FOR IRRIGATION IR SOLN
Status: DC | PRN
  Administered 2014-11-02: 3000 mL

## 2014-11-02 SURGICAL SUPPLY — 25 items
ADAPTER CATH URET PLST 4-6FR (CATHETERS) IMPLANT
ADPR CATH URET STRL DISP 4-6FR (CATHETERS)
BAG DRAIN URO-CYSTO SKYTR STRL (DRAIN) ×2 IMPLANT
BAG DRN UROCATH (DRAIN) ×1
BALLN NEPHROSTOMY (BALLOONS) ×2
BALLOON NEPHROSTOMY (BALLOONS) IMPLANT
BOOTIES KNEE HIGH SLOAN (MISCELLANEOUS) ×2 IMPLANT
CANISTER SUCT LVC 12 LTR MEDI- (MISCELLANEOUS) ×1 IMPLANT
CATH FOLEY 2W COUNCIL 20FR 5CC (CATHETERS) IMPLANT
CATH INTERMIT  6FR 70CM (CATHETERS) IMPLANT
CATH URET 5FR 28IN CONE TIP (BALLOONS)
CATH URET 5FR 28IN OPEN ENDED (CATHETERS) IMPLANT
CATH URET 5FR 70CM CONE TIP (BALLOONS) IMPLANT
CLOTH BEACON ORANGE TIMEOUT ST (SAFETY) ×2 IMPLANT
GLOVE BIO SURGEON STRL SZ7.5 (GLOVE) ×2 IMPLANT
GOWN STRL REUS W/TWL LRG LVL3 (GOWN DISPOSABLE) ×1 IMPLANT
GOWN STRL REUS W/TWL XL LVL3 (GOWN DISPOSABLE) ×1 IMPLANT
GOWN XL W/COTTON TOWEL STD (GOWNS) ×2 IMPLANT
GUIDEWIRE 0.038 PTFE COATED (WIRE) IMPLANT
GUIDEWIRE ANG ZIPWIRE 038X150 (WIRE) IMPLANT
GUIDEWIRE STR DUAL SENSOR (WIRE) ×2 IMPLANT
NS IRRIG 500ML POUR BTL (IV SOLUTION) IMPLANT
PACK CYSTO (CUSTOM PROCEDURE TRAY) ×2 IMPLANT
SYRINGE IRR TOOMEY STRL 70CC (SYRINGE) IMPLANT
WATER STERILE IRR 3000ML UROMA (IV SOLUTION) ×2 IMPLANT

## 2014-11-02 NOTE — Anesthesia Procedure Notes (Signed)
Procedure Name: LMA Insertion Date/Time: 11/02/2014 10:10 AM Performed by: Bethena Roys T Pre-anesthesia Checklist: Patient identified, Emergency Drugs available, Suction available and Patient being monitored Patient Re-evaluated:Patient Re-evaluated prior to inductionOxygen Delivery Method: Circle System Utilized Preoxygenation: Pre-oxygenation with 100% oxygen Intubation Type: IV induction Ventilation: Mask ventilation without difficulty LMA: LMA inserted LMA Size: 5.0 Number of attempts: 1 Airway Equipment and Method: Bite block Placement Confirmation: positive ETCO2 Tube secured with: Tape Dental Injury: Teeth and Oropharynx as per pre-operative assessment

## 2014-11-02 NOTE — Anesthesia Preprocedure Evaluation (Signed)
Anesthesia Evaluation  Patient identified by MRN, date of birth, ID band Patient awake    Reviewed: Allergy & Precautions, NPO status , Patient's Chart, lab work & pertinent test results  Airway Mallampati: I       Dental   Pulmonary sleep apnea , former smoker,    Pulmonary exam normal       Cardiovascular Normal cardiovascular exam    Neuro/Psych    GI/Hepatic hiatal hernia, GERD-  ,  Endo/Other  diabetes, Type 2, Insulin Dependent  Renal/GU      Musculoskeletal  (+) Arthritis -,   Abdominal   Peds  Hematology   Anesthesia Other Findings   Reproductive/Obstetrics                             Anesthesia Physical Anesthesia Plan  ASA: II  Anesthesia Plan: General   Post-op Pain Management:    Induction: Intravenous  Airway Management Planned: LMA and Oral ETT  Additional Equipment:   Intra-op Plan:   Post-operative Plan: Extubation in OR  Informed Consent: I have reviewed the patients History and Physical, chart, labs and discussed the procedure including the risks, benefits and alternatives for the proposed anesthesia with the patient or authorized representative who has indicated his/her understanding and acceptance.     Plan Discussed with: CRNA, Anesthesiologist and Surgeon  Anesthesia Plan Comments:         Anesthesia Quick Evaluation

## 2014-11-02 NOTE — Discharge Instructions (Addendum)
Urethrotomy Urethrotomy is a surgical procedure to treat a section of the urethra that is too narrow. The urethra is the tube that carries urine from your bladder out through the tip of your penis. Narrowing of the urethra (urethral stricture) makes it hard or painful to pass urine. You may also be at risk for more frequent urinary infections. Scarring from infection or injury are common causes of urethral stricture. During urethrotomy, your surgeon first passes a thin telescope (cystoscope) into your urethra. Then the surgeon uses a cutting instrument (cold knife) or a heat source (hot knife) to remove any urethral strictures.  LET [facility] CARE PROVIDER KNOW ABOUT:  Any allergies you have.  All medicines you are taking, including vitamins, herbs, eye drops, creams, and over-the-counter medicines.  Previous problems you or members of your family have had with the use of anesthetics.  Any blood disorders you have.  Previous surgeries you have had.  Medical conditions you have.  Any pacemaker or defibrillator. RISKS AND COMPLICATIONS Generally, this is a safe procedure. However, as with any procedure, problems can occur. The most common problem is having another urethral stricture after surgery. Other less common problems include:   Bleeding.  Burning pain.  Infection.  Needing to put a tube (catheter) in your urethra to prevent the stricture from happening again.  Trouble getting an erection (erectile dysfunction). BEFORE THE PROCEDURE  Do not eat or drink anything after midnight on the night before the procedure or as directed by your health care provider.  Ask your health care provider about changing or stopping your regular medicines. This is especially important if you are taking diabetes medicines or blood thinners.  You may need to have a urine test to check for signs of infection.  You may get antibiotic medicines by injection or through an IV access just before the  procedure. This is to prevent infection.  Take a shower before coming to the hospital for surgery. PROCEDURE This procedure usually takes about 30 minutes. During the procedure, the following may happen:  You will be given one of the following:  A medicine that makes you go to sleep (general anesthetic).  A medicine injected into your spine that numbs your body below the waist (spinal anesthetic).  The tip of your penis will be cleaned with a germ-killing (antibacterial) solution.  The surgeon will pass the cystoscope into your urethra.  Urethral strictures will be cut with the cold or hot knife.  You will most likely have a catheter inserted through your urethra and into your bladder. This is to drain your urine. AFTER THE PROCEDURE  You will stay in the recovery area until the anesthesia wears off and your health care provider says you can go home. Follow all instructions carefully.  Take all medicines as directed by your health care provider.  You may get prescriptions for antibiotics and a pain reliever.  You may have a small bandage on the tip of your penis.  You may be sent home with a catheter in place. Follow your health care provider's instructions on how to care for the catheter at home. You will need to return to have the catheter removed as directed by your health care provider.  You may see some blood leaking around the catheter when you pass urine.  Drink enough fluid to keep your urine clear or pale yellow.  Ask your health care provider when you can go back to your normal activities after the catheter is removed.  Keep all follow-up appointments. Document Released: 06/13/2013 Document Reviewed: 06/13/2013 [facility] Patient Information 2015 [place], [place]. This information is not intended to replace advice given to you by your health care provider. Make sure you discuss any questions you have with your health care provider.     Post Anesthesia Home Care  Instructions  Activity: Get plenty of rest for the remainder of the day. A responsible adult should stay with you for 24 hours following the procedure.  For the next 24 hours, DO NOT: -Drive a car -Paediatric nurse -Drink alcoholic beverages -Take any medication unless instructed by your physician -Make any legal decisions or sign important papers.  Meals: Start with liquid foods such as gelatin or soup. Progress to regular foods as tolerated. Avoid greasy, spicy, heavy foods. If nausea and/or vomiting occur, drink only clear liquids until the nausea and/or vomiting subsides. Call your physician if vomiting continues.  Special Instructions/Symptoms: Your throat may feel dry or sore from the anesthesia or the breathing tube placed in your throat during surgery. If this causes discomfort, gargle with warm salt water. The discomfort should disappear within 24 hours.  If you had a scopolamine patch placed behind your ear for the management of post- operative nausea and/or vomiting:  1. The medication in the patch is effective for 72 hours, after which it should be removed.  Wrap patch in a tissue and discard in the trash. Wash hands thoroughly with soap and water. 2. You may remove the patch earlier than 72 hours if you experience unpleasant side effects which may include dry mouth, dizziness or visual disturbances. 3. Avoid touching the patch. Wash your hands with soap and water after contact with the patch.

## 2014-11-02 NOTE — Interval H&P Note (Signed)
History and Physical Interval Note:  11/02/2014 9:27 AM  Randall Horton  has presented today for surgery, with the diagnosis of URETHRAL STRICTURE  The various methods of treatment have been discussed with the patient and family. After consideration of risks, benefits and other options for treatment, the patient has consented to  Procedure(s): [place] (N/A) as a surgical intervention .  The patient's history has been reviewed, patient examined, no change in status, stable for surgery.  I have reviewed the patient's chart and labs.  Questions were answered to the patient's satisfaction.     Luciana Cammarata I Kitzia Camus

## 2014-11-02 NOTE — Transfer of Care (Signed)
Immediate Anesthesia Transfer of Care Note  Patient: Randall Horton  Procedure(s) Performed: Procedure(s): CYSTOSCOPY WITH URETHRAL DILATATION AND  POSSIBLE COOK DILATIION (N/A)  Patient Location: PACU  Anesthesia Type:General  Level of Consciousness: awake  Airway & Oxygen Therapy: Patient Spontanous Breathing and Patient connected to nasal cannula oxygen  Post-op Assessment: Report given to RN  Post vital signs: Reviewed and stable  Last Vitals:  Filed Vitals:   11/02/14 0856  BP: 132/74  Pulse: 73  Temp: 36.6 C  Resp: 16    Complications: No apparent anesthesia complications

## 2014-11-02 NOTE — Op Note (Signed)
Pre-operative diagnosis :  Submeatal urethral stricture  Postoperative diagnosis:  same  Operation: Urethral meatal dilation, Cook balloon dilation of urethral meatus and anterior urethra  Surgeon:  S. Gaynelle Arabian, MD  First assistant:  None Anesthesia:  General LMA  Preparation:  After approp[rio  Review history:  1. Benign localized hyperplasia of prostate with urinary obstruction (N40.1,N13.8)  Assessed By: Carolan Clines (Urology); Last Assessed: 15 Oct 2014 2. Postoperative urethral stricture  Assessed By: Carolan Clines (Urology); Last Assessed: 15 Oct 2014 3. Urinary frequency (R35.0)  Assessed By: Carolan Clines (Urology); Last Assessed: 15 Oct 2014  History of Present Illness 69 YO diabetic male returns today for a 3 mo f/u with a hx of BPH, now off Tamsulosin. He is s/p cystourethroscopy, [facility] TUR-P 06/28/14.Off tamsulosin since surgery. Feels that he has some urine spray, with sensation of obstruction behind head of penis.     Pathology: benign prostatic hyperplasia. No tumor seen.   Statement of  Likelihood of Success: Excellent. TIME-OUT observed.:  Procedure: After appropriate premedication, the patient was brought to the upper room, placed on the operating table in the dorsal supine position where general LMA anesthesia was introduced. He was then replaced in the dorsal lithotomy position with pubis was prepped with Betadine solution and draped in usual fashion.  Procedure: Attempted cystoscopy was unsuccessful because of meatal and sub-meatal stricture. The meatus was dilated to a size 22 [place] with the [employer]. The subgaleal stricture was then cannulated with an 0.038 guidewire under fluoroscopic control, and a Cook balloon dilator was then passed over the guidewire, to the level of the membranous urethra. The urethra was then balloon dilated to 18 atm pressure for 3 minutes. Following this, cystoscopy was accomplished with the flexor was  cystoscoped, and showed some irritated sub-meatal urethral tissue, where previous stricture had occurred. The remaining urethra appeared normal. The prostatic fossa appeared post resected and healing. The bladder was normal. There is no evidence of stone tumor or diverticular formation. The scope was removed. Xylocaine jelly was placed in the urethra. The patient was awakened and taken to recovery room in good condition.

## 2014-11-02 NOTE — Anesthesia Postprocedure Evaluation (Signed)
  Anesthesia Post-op Note  Patient: Randall Horton  Procedure(s) Performed: Procedure(s): CYSTOSCOPY WITH URETHRAL DILATATION AND  POSSIBLE COOK DILATIION (N/A)  Patient Location: PACU  Anesthesia Type:General  Level of Consciousness: awake, alert , oriented and patient cooperative  Airway and Oxygen Therapy: Patient Spontanous Breathing  Post-op Pain: none  Post-op Assessment: Post-op Vital signs reviewed, Patient's Cardiovascular Status Stable, Respiratory Function Stable, Patent Airway, No signs of Nausea or vomiting and Pain level controlled  Post-op Vital Signs: stable  Last Vitals:  Filed Vitals:   11/02/14 0856  BP: 132/74  Pulse: 73  Temp: 36.6 C  Resp: 16    Complications: No apparent anesthesia complications

## 2014-11-05 ENCOUNTER — Encounter (HOSPITAL_BASED_OUTPATIENT_CLINIC_OR_DEPARTMENT_OTHER): Payer: Self-pay | Admitting: Urology

## 2015-02-15 ENCOUNTER — Encounter: Payer: Self-pay | Admitting: Cardiovascular Disease

## 2015-03-19 ENCOUNTER — Encounter: Payer: Self-pay | Admitting: Internal Medicine

## 2015-04-03 DIAGNOSIS — H348311 Tributary (branch) retinal vein occlusion, right eye, with retinal neovascularization: Secondary | ICD-10-CM | POA: Diagnosis not present

## 2015-04-05 DIAGNOSIS — Z23 Encounter for immunization: Secondary | ICD-10-CM | POA: Diagnosis not present

## 2015-04-11 DIAGNOSIS — E109 Type 1 diabetes mellitus without complications: Secondary | ICD-10-CM | POA: Diagnosis not present

## 2015-05-14 DIAGNOSIS — R351 Nocturia: Secondary | ICD-10-CM | POA: Diagnosis not present

## 2015-05-14 DIAGNOSIS — N401 Enlarged prostate with lower urinary tract symptoms: Secondary | ICD-10-CM | POA: Diagnosis not present

## 2015-05-21 DIAGNOSIS — E785 Hyperlipidemia, unspecified: Secondary | ICD-10-CM | POA: Diagnosis not present

## 2015-05-21 DIAGNOSIS — M109 Gout, unspecified: Secondary | ICD-10-CM | POA: Diagnosis not present

## 2015-05-21 DIAGNOSIS — E0590 Thyrotoxicosis, unspecified without thyrotoxic crisis or storm: Secondary | ICD-10-CM | POA: Diagnosis not present

## 2015-05-21 DIAGNOSIS — E113393 Type 2 diabetes mellitus with moderate nonproliferative diabetic retinopathy without macular edema, bilateral: Secondary | ICD-10-CM | POA: Diagnosis not present

## 2015-05-21 DIAGNOSIS — E1142 Type 2 diabetes mellitus with diabetic polyneuropathy: Secondary | ICD-10-CM | POA: Diagnosis not present

## 2015-05-21 DIAGNOSIS — E10319 Type 1 diabetes mellitus with unspecified diabetic retinopathy without macular edema: Secondary | ICD-10-CM | POA: Diagnosis not present

## 2015-05-21 DIAGNOSIS — N08 Glomerular disorders in diseases classified elsewhere: Secondary | ICD-10-CM | POA: Diagnosis not present

## 2015-05-21 DIAGNOSIS — R740 Nonspecific elevation of levels of transaminase and lactic acid dehydrogenase [LDH]: Secondary | ICD-10-CM | POA: Diagnosis not present

## 2015-05-21 DIAGNOSIS — N401 Enlarged prostate with lower urinary tract symptoms: Secondary | ICD-10-CM | POA: Diagnosis not present

## 2015-05-21 DIAGNOSIS — E559 Vitamin D deficiency, unspecified: Secondary | ICD-10-CM | POA: Diagnosis not present

## 2015-05-30 DIAGNOSIS — E109 Type 1 diabetes mellitus without complications: Secondary | ICD-10-CM | POA: Diagnosis not present

## 2015-06-14 ENCOUNTER — Telehealth: Payer: Self-pay | Admitting: Internal Medicine

## 2015-06-14 NOTE — Telephone Encounter (Signed)
Rec'd from Dr. Earlean Shawl forward 5 pages to Dr. Henrene Pastor

## 2015-07-04 NOTE — Telephone Encounter (Signed)
Magda Paganini,  Do you know if we have received these records? Patient is calling in wanting to know when he can schedule. He is requesting to see Dr. Henrene Pastor.

## 2015-07-11 DIAGNOSIS — E109 Type 1 diabetes mellitus without complications: Secondary | ICD-10-CM | POA: Diagnosis not present

## 2015-07-12 DIAGNOSIS — E109 Type 1 diabetes mellitus without complications: Secondary | ICD-10-CM | POA: Diagnosis not present

## 2015-07-12 DIAGNOSIS — N08 Glomerular disorders in diseases classified elsewhere: Secondary | ICD-10-CM | POA: Diagnosis not present

## 2015-07-12 DIAGNOSIS — Z4681 Encounter for fitting and adjustment of insulin pump: Secondary | ICD-10-CM | POA: Diagnosis not present

## 2015-07-15 NOTE — Telephone Encounter (Signed)
Per Magda Paganini, we have not received these records. Left message for Marcie Bal at Medical Records to return my call.

## 2015-07-16 ENCOUNTER — Telehealth: Payer: Self-pay | Admitting: Internal Medicine

## 2015-07-16 ENCOUNTER — Encounter: Payer: Self-pay | Admitting: Internal Medicine

## 2015-07-16 NOTE — Telephone Encounter (Signed)
Records do not need to be reviewed. Last colon was in 2004 Per Amy at Dr. Earlean Shawl office.

## 2015-07-16 NOTE — Telephone Encounter (Signed)
Received GI records from Dr. Earlean Shawl office and placed on Dr. Blanch Media desk for review. Patient is requesting to see Dr. Henrene Pastor.

## 2015-07-16 NOTE — Telephone Encounter (Signed)
I spoke to [place] at Dr. Earlean Shawl office and she will refax records to [place].

## 2015-08-07 DIAGNOSIS — H3562 Retinal hemorrhage, left eye: Secondary | ICD-10-CM | POA: Diagnosis not present

## 2015-08-07 DIAGNOSIS — H34831 Tributary (branch) retinal vein occlusion, right eye, with macular edema: Secondary | ICD-10-CM | POA: Diagnosis not present

## 2015-08-07 DIAGNOSIS — D3132 Benign neoplasm of left choroid: Secondary | ICD-10-CM | POA: Diagnosis not present

## 2015-08-19 ENCOUNTER — Telehealth: Payer: Self-pay | Admitting: *Deleted

## 2015-08-19 NOTE — Telephone Encounter (Signed)
Randall Horton, patient is coming in for pre-visit on 08/21/15, his colonoscopy is on 09/04/15. Looks like he has insulin pump. Thought we could go ahead and contact his PCP about insulin pump instructions before colonoscopy. Thank you, Ranferi Clingan PV.

## 2015-08-21 ENCOUNTER — Ambulatory Visit (AMBULATORY_SURGERY_CENTER): Payer: Self-pay

## 2015-08-21 VITALS — Ht 70.0 in | Wt 217.0 lb

## 2015-08-21 DIAGNOSIS — Z1211 Encounter for screening for malignant neoplasm of colon: Secondary | ICD-10-CM

## 2015-08-21 MED ORDER — NA SULFATE-K SULFATE-MG SULF 17.5-3.13-1.6 GM/177ML PO SOLN: 1.0000 | Freq: Once | ORAL | Status: DC

## 2015-08-21 NOTE — Progress Notes (Signed)
No egg or soy allergies Not on home 02 No previous anesthesia complications No diet or weight loss meds 

## 2015-08-23 NOTE — Telephone Encounter (Signed)
Sent insulin pump letter to Dr. Forde Dandy

## 2015-09-04 ENCOUNTER — Ambulatory Visit (AMBULATORY_SURGERY_CENTER): Payer: Medicare HMO | Admitting: Internal Medicine

## 2015-09-04 ENCOUNTER — Encounter: Payer: Self-pay | Admitting: Internal Medicine

## 2015-09-04 VITALS — BP 161/88 | HR 88 | Temp 98.6°F | Resp 17 | Ht 70.0 in | Wt 217.0 lb

## 2015-09-04 DIAGNOSIS — Z1211 Encounter for screening for malignant neoplasm of colon: Secondary | ICD-10-CM

## 2015-09-04 DIAGNOSIS — E119 Type 2 diabetes mellitus without complications: Secondary | ICD-10-CM | POA: Diagnosis not present

## 2015-09-04 DIAGNOSIS — D122 Benign neoplasm of ascending colon: Secondary | ICD-10-CM | POA: Diagnosis not present

## 2015-09-04 LAB — GLUCOSE, CAPILLARY
Glucose-Capillary: 89 mg/dL (ref 65–99)
Glucose-Capillary: 82 mg/dL (ref 65–99)

## 2015-09-04 MED ORDER — SODIUM CHLORIDE 0.9 % IV SOLN: 500.0000 mL | INTRAVENOUS | Status: DC

## 2015-09-04 NOTE — Op Note (Signed)
[facility] Patient Name: Randall Horton Procedure Date: 09/04/2015 8:43 AM MRN: [id number] Endoscopist: Docia Chuck. Henrene Pastor , MD Age: 70 Referring MD:  Date of Birth: 01/28/1946 Gender: Male Procedure:                Colonoscopy Indications:              Screening for colorectal malignant neoplasm Medicines:                Monitored Anesthesia Care Procedure:                Pre-Anesthesia Assessment:                           - Prior to the procedure, a History and Physical                            was performed, and patient medications and                            allergies were reviewed. The patient's tolerance of                            previous anesthesia was also reviewed. The risks                            and benefits of the procedure and the sedation                            options and risks were discussed with the patient.                            All questions were answered, and informed consent                            was obtained. Prior Anticoagulants: The patient has                            taken no previous anticoagulant or antiplatelet                            agents. ASA Grade Assessment: II - A patient with                            mild systemic disease. After reviewing the risks                            and benefits, the patient was deemed in                            satisfactory condition to undergo the procedure.                           After obtaining informed consent, the colonoscope  was passed under direct vision. Throughout the                            procedure, the patient's blood pressure, pulse, and                            oxygen saturations were monitored continuously. The                            Model PCF-H190L [id number]) scope was introduced                            through the anus and advanced to the the cecum,                            identified by appendiceal orifice and  ileocecal                            valve. The colonoscopy was performed without                            difficulty. The patient tolerated the procedure                            well. The quality of the bowel preparation was                            excellent. The bowel preparation used was SUPREP.                            The ileocecal valve, appendiceal orifice, and                            rectum were photographed. Scope In: 8:54:09 AM Scope Out: 9:10:33 AM Scope Withdrawal Time: 0 hours 13 minutes 40 seconds  Total Procedure Duration: 0 hours 16 minutes 24 seconds  Findings:      The perianal and digital rectal examinations were normal.      A 1 mm polyp was found in the ascending colon. The polyp was removed       with a cold snare. Resection and retrieval were complete.      Multiple medium-mouthed diverticula were found in the left colon.      The exam was otherwise without abnormality on direct and retroflexion       views. Complications:            No immediate complications. Estimated Blood Loss:     Estimated blood loss: none. Impression:               - One 1 mm polyp in the ascending colon, removed                            with a cold snare. Resected and retrieved.                           - Moderate  diverticulosis in the left colon.                           - The examination was otherwise normal on direct                            and retroflexion views. Recommendation:           - Resume previous diet.                           - Continue present medications.                           - Await pathology results.                           - If the pathology report reveals adenomatous                            tissue, then repeat the colonoscopy for                            surveillance in 5 years, otherwise prn. Procedure Code(s):        --- Professional ---                           [phone], Colonoscopy, flexible; with removal of                             tumor(s), polyp(s), or other lesion(s) by snare                            technique CPT copyright 2016 American [employer]. All rights reserved. Docia Chuck. Henrene Pastor, MD Docia Chuck. Henrene Pastor, MD 09/04/2015 9:18:21 AM This report has been signed electronically. Number of Addenda: 0

## 2015-09-04 NOTE — Progress Notes (Signed)
A/ox3 pleased with MAC, report to Karen RN 

## 2015-09-04 NOTE — Patient Instructions (Signed)
Handouts given: Polyps, Diverticulosis.   YOU HAD AN ENDOSCOPIC PROCEDURE TODAY AT [facility]:   Refer to the procedure report that was given to you for any specific questions about what was found during the examination.  If the procedure report does not answer your questions, please call your gastroenterologist to clarify.  If you requested that your care partner not be given the details of your procedure findings, then the procedure report has been included in a sealed envelope for you to review at your convenience later.  YOU SHOULD EXPECT: Some feelings of bloating in the abdomen. Passage of more gas than usual.  Walking can help get rid of the air that was put into your GI tract during the procedure and reduce the bloating. If you had a lower endoscopy (such as a colonoscopy or flexible sigmoidoscopy) you may notice spotting of blood in your stool or on the toilet paper. If you underwent a bowel prep for your procedure, you may not have a normal bowel movement for a few days.  Please Note:  You might notice some irritation and congestion in your nose or some drainage.  This is from the oxygen used during your procedure.  There is no need for concern and it should clear up in a day or so.  SYMPTOMS TO REPORT IMMEDIATELY:   Following lower endoscopy (colonoscopy or flexible sigmoidoscopy):  Excessive amounts of blood in the stool  Significant tenderness or worsening of abdominal pains  Swelling of the abdomen that is new, acute  Fever of 100F or higher   For urgent or emergent issues, a gastroenterologist can be reached at any hour by calling [phone].   DIET: Your first meal following the procedure should be a small meal and then it is ok to progress to your normal diet. Heavy or fried foods are harder to digest and may make you feel nauseous or bloated.  Likewise, meals heavy in dairy and vegetables can increase bloating.  Drink plenty of fluids but you should  avoid alcoholic beverages for 24 hours.  ACTIVITY:  You should plan to take it easy for the rest of today and you should NOT DRIVE or use heavy machinery until tomorrow (because of the sedation medicines used during the test).    FOLLOW UP: Our staff will call the number listed on your records the next business day following your procedure to check on you and address any questions or concerns that you may have regarding the information given to you following your procedure. If we do not reach you, we will leave a message.  However, if you are feeling well and you are not experiencing any problems, there is no need to return our call.  We will assume that you have returned to your regular daily activities without incident.  If any biopsies were taken you will be contacted by phone or by letter within the next 1-3 weeks.  Please call us at [phone] if you have not heard about the biopsies in 3 weeks.    SIGNATURES/CONFIDENTIALITY: You and/or your care partner have signed paperwork which will be entered into your electronic medical record.  These signatures attest to the fact that that the information above on your After Visit Summary has been reviewed and is understood.  Full responsibility of the confidentiality of this discharge information lies with you and/or your care-partner.

## 2015-09-04 NOTE — Progress Notes (Signed)
Called to room to assist during endoscopic procedure.  Patient ID and intended procedure confirmed with present staff. Received instructions for my participation in the procedure from the performing physician.  

## 2015-09-04 NOTE — Progress Notes (Signed)
Patient given 4 cracker packages and two peanut butters for his trip home. Patient asymptomatic.

## 2015-09-04 NOTE — Progress Notes (Signed)
Patient reconnected his insulin pump. Blood sugars while at endoscopy given to patient.

## 2015-09-05 ENCOUNTER — Telehealth: Payer: Self-pay | Admitting: *Deleted

## 2015-09-05 NOTE — Telephone Encounter (Signed)
  Follow up Call-  Call back number 09/04/2015  Post procedure Call Back phone  # [phone]  Permission to leave phone message Yes     Patient questions:  Do you have a fever, pain , or abdominal swelling? No. Pain Score  0 *  Have you tolerated food without any problems? Yes.    Have you been able to return to your normal activities? Yes.    Do you have any questions about your discharge instructions: Diet   No. Medications  No. Follow up visit  No.  Do you have questions or concerns about your Care? No.  Actions: * If pain score is 4 or above: No action needed, pain <4.

## 2015-09-12 DIAGNOSIS — Z1389 Encounter for screening for other disorder: Secondary | ICD-10-CM | POA: Diagnosis not present

## 2015-09-12 DIAGNOSIS — E10319 Type 1 diabetes mellitus with unspecified diabetic retinopathy without macular edema: Secondary | ICD-10-CM | POA: Diagnosis not present

## 2015-09-12 DIAGNOSIS — N08 Glomerular disorders in diseases classified elsewhere: Secondary | ICD-10-CM | POA: Diagnosis not present

## 2015-09-12 DIAGNOSIS — Z6829 Body mass index (BMI) 29.0-29.9, adult: Secondary | ICD-10-CM | POA: Diagnosis not present

## 2015-09-12 DIAGNOSIS — R740 Nonspecific elevation of levels of transaminase and lactic acid dehydrogenase [LDH]: Secondary | ICD-10-CM | POA: Diagnosis not present

## 2015-09-12 DIAGNOSIS — N401 Enlarged prostate with lower urinary tract symptoms: Secondary | ICD-10-CM | POA: Diagnosis not present

## 2015-09-12 DIAGNOSIS — H61101 Unspecified noninfective disorders of pinna, right ear: Secondary | ICD-10-CM | POA: Diagnosis not present

## 2015-09-12 DIAGNOSIS — E1042 Type 1 diabetes mellitus with diabetic polyneuropathy: Secondary | ICD-10-CM | POA: Diagnosis not present

## 2015-09-12 DIAGNOSIS — I739 Peripheral vascular disease, unspecified: Secondary | ICD-10-CM | POA: Diagnosis not present

## 2015-09-12 DIAGNOSIS — I6529 Occlusion and stenosis of unspecified carotid artery: Secondary | ICD-10-CM | POA: Diagnosis not present

## 2015-09-15 ENCOUNTER — Encounter: Payer: Self-pay | Admitting: Internal Medicine

## 2015-09-17 DIAGNOSIS — L821 Other seborrheic keratosis: Secondary | ICD-10-CM | POA: Diagnosis not present

## 2015-09-17 DIAGNOSIS — C44222 Squamous cell carcinoma of skin of right ear and external auricular canal: Secondary | ICD-10-CM | POA: Diagnosis not present

## 2015-09-17 DIAGNOSIS — D485 Neoplasm of uncertain behavior of skin: Secondary | ICD-10-CM | POA: Diagnosis not present

## 2015-09-17 DIAGNOSIS — L570 Actinic keratosis: Secondary | ICD-10-CM | POA: Diagnosis not present

## 2015-10-11 DIAGNOSIS — E109 Type 1 diabetes mellitus without complications: Secondary | ICD-10-CM | POA: Diagnosis not present

## 2015-11-09 DIAGNOSIS — S61412A Laceration without foreign body of left hand, initial encounter: Secondary | ICD-10-CM | POA: Diagnosis not present

## 2016-01-14 DIAGNOSIS — E109 Type 1 diabetes mellitus without complications: Secondary | ICD-10-CM | POA: Diagnosis not present

## 2016-01-17 DIAGNOSIS — L218 Other seborrheic dermatitis: Secondary | ICD-10-CM | POA: Diagnosis not present

## 2016-01-17 DIAGNOSIS — Z85828 Personal history of other malignant neoplasm of skin: Secondary | ICD-10-CM | POA: Diagnosis not present

## 2016-01-17 DIAGNOSIS — L821 Other seborrheic keratosis: Secondary | ICD-10-CM | POA: Diagnosis not present

## 2016-01-17 DIAGNOSIS — L570 Actinic keratosis: Secondary | ICD-10-CM | POA: Diagnosis not present

## 2016-01-21 DIAGNOSIS — E0590 Thyrotoxicosis, unspecified without thyrotoxic crisis or storm: Secondary | ICD-10-CM | POA: Diagnosis not present

## 2016-01-21 DIAGNOSIS — I6529 Occlusion and stenosis of unspecified carotid artery: Secondary | ICD-10-CM | POA: Diagnosis not present

## 2016-01-21 DIAGNOSIS — E559 Vitamin D deficiency, unspecified: Secondary | ICD-10-CM | POA: Diagnosis not present

## 2016-01-21 DIAGNOSIS — E1142 Type 2 diabetes mellitus with diabetic polyneuropathy: Secondary | ICD-10-CM | POA: Diagnosis not present

## 2016-01-21 DIAGNOSIS — N08 Glomerular disorders in diseases classified elsewhere: Secondary | ICD-10-CM | POA: Diagnosis not present

## 2016-01-21 DIAGNOSIS — D126 Benign neoplasm of colon, unspecified: Secondary | ICD-10-CM | POA: Diagnosis not present

## 2016-01-21 DIAGNOSIS — I7389 Other specified peripheral vascular diseases: Secondary | ICD-10-CM | POA: Diagnosis not present

## 2016-01-21 DIAGNOSIS — E10319 Type 1 diabetes mellitus with unspecified diabetic retinopathy without macular edema: Secondary | ICD-10-CM | POA: Diagnosis not present

## 2016-01-21 DIAGNOSIS — R1084 Generalized abdominal pain: Secondary | ICD-10-CM | POA: Diagnosis not present

## 2016-01-21 DIAGNOSIS — E113393 Type 2 diabetes mellitus with moderate nonproliferative diabetic retinopathy without macular edema, bilateral: Secondary | ICD-10-CM | POA: Diagnosis not present

## 2016-01-23 DIAGNOSIS — N08 Glomerular disorders in diseases classified elsewhere: Secondary | ICD-10-CM | POA: Diagnosis not present

## 2016-01-23 DIAGNOSIS — Z6829 Body mass index (BMI) 29.0-29.9, adult: Secondary | ICD-10-CM | POA: Diagnosis not present

## 2016-01-23 DIAGNOSIS — Z4681 Encounter for fitting and adjustment of insulin pump: Secondary | ICD-10-CM | POA: Diagnosis not present

## 2016-01-23 DIAGNOSIS — E10319 Type 1 diabetes mellitus with unspecified diabetic retinopathy without macular edema: Secondary | ICD-10-CM | POA: Diagnosis not present

## 2016-01-31 DIAGNOSIS — E10319 Type 1 diabetes mellitus with unspecified diabetic retinopathy without macular edema: Secondary | ICD-10-CM | POA: Diagnosis not present

## 2016-02-04 DIAGNOSIS — Z961 Presence of intraocular lens: Secondary | ICD-10-CM | POA: Diagnosis not present

## 2016-02-04 DIAGNOSIS — H34231 Retinal artery branch occlusion, right eye: Secondary | ICD-10-CM | POA: Diagnosis not present

## 2016-02-04 DIAGNOSIS — D3132 Benign neoplasm of left choroid: Secondary | ICD-10-CM | POA: Diagnosis not present

## 2016-02-04 DIAGNOSIS — H35373 Puckering of macula, bilateral: Secondary | ICD-10-CM | POA: Diagnosis not present

## 2016-02-04 DIAGNOSIS — H34831 Tributary (branch) retinal vein occlusion, right eye, with macular edema: Secondary | ICD-10-CM | POA: Diagnosis not present

## 2016-03-10 DIAGNOSIS — N08 Glomerular disorders in diseases classified elsewhere: Secondary | ICD-10-CM | POA: Diagnosis not present

## 2016-03-10 DIAGNOSIS — E10319 Type 1 diabetes mellitus with unspecified diabetic retinopathy without macular edema: Secondary | ICD-10-CM | POA: Diagnosis not present

## 2016-03-10 DIAGNOSIS — Z4681 Encounter for fitting and adjustment of insulin pump: Secondary | ICD-10-CM | POA: Diagnosis not present

## 2016-03-10 DIAGNOSIS — Z6829 Body mass index (BMI) 29.0-29.9, adult: Secondary | ICD-10-CM | POA: Diagnosis not present

## 2016-03-10 DIAGNOSIS — Z23 Encounter for immunization: Secondary | ICD-10-CM | POA: Diagnosis not present

## 2016-04-08 DIAGNOSIS — H43813 Vitreous degeneration, bilateral: Secondary | ICD-10-CM | POA: Diagnosis not present

## 2016-04-08 DIAGNOSIS — H35372 Puckering of macula, left eye: Secondary | ICD-10-CM | POA: Diagnosis not present

## 2016-04-08 DIAGNOSIS — H34831 Tributary (branch) retinal vein occlusion, right eye, with macular edema: Secondary | ICD-10-CM | POA: Diagnosis not present

## 2016-05-22 DIAGNOSIS — E109 Type 1 diabetes mellitus without complications: Secondary | ICD-10-CM | POA: Diagnosis not present

## 2016-05-25 DIAGNOSIS — E559 Vitamin D deficiency, unspecified: Secondary | ICD-10-CM | POA: Diagnosis not present

## 2016-05-25 DIAGNOSIS — D126 Benign neoplasm of colon, unspecified: Secondary | ICD-10-CM | POA: Diagnosis not present

## 2016-05-25 DIAGNOSIS — E0590 Thyrotoxicosis, unspecified without thyrotoxic crisis or storm: Secondary | ICD-10-CM | POA: Diagnosis not present

## 2016-05-25 DIAGNOSIS — R740 Nonspecific elevation of levels of transaminase and lactic acid dehydrogenase [LDH]: Secondary | ICD-10-CM | POA: Diagnosis not present

## 2016-05-25 DIAGNOSIS — E784 Other hyperlipidemia: Secondary | ICD-10-CM | POA: Diagnosis not present

## 2016-05-25 DIAGNOSIS — E1142 Type 2 diabetes mellitus with diabetic polyneuropathy: Secondary | ICD-10-CM | POA: Diagnosis not present

## 2016-05-25 DIAGNOSIS — N401 Enlarged prostate with lower urinary tract symptoms: Secondary | ICD-10-CM | POA: Diagnosis not present

## 2016-05-25 DIAGNOSIS — M109 Gout, unspecified: Secondary | ICD-10-CM | POA: Diagnosis not present

## 2016-05-25 DIAGNOSIS — E10319 Type 1 diabetes mellitus with unspecified diabetic retinopathy without macular edema: Secondary | ICD-10-CM | POA: Diagnosis not present

## 2016-05-25 DIAGNOSIS — N08 Glomerular disorders in diseases classified elsewhere: Secondary | ICD-10-CM | POA: Diagnosis not present

## 2016-06-23 DIAGNOSIS — N5201 Erectile dysfunction due to arterial insufficiency: Secondary | ICD-10-CM | POA: Diagnosis not present

## 2016-06-23 DIAGNOSIS — R350 Frequency of micturition: Secondary | ICD-10-CM | POA: Diagnosis not present

## 2016-06-23 DIAGNOSIS — N401 Enlarged prostate with lower urinary tract symptoms: Secondary | ICD-10-CM | POA: Diagnosis not present

## 2016-07-01 DIAGNOSIS — E10319 Type 1 diabetes mellitus with unspecified diabetic retinopathy without macular edema: Secondary | ICD-10-CM | POA: Diagnosis not present

## 2016-07-01 DIAGNOSIS — N08 Glomerular disorders in diseases classified elsewhere: Secondary | ICD-10-CM | POA: Diagnosis not present

## 2016-07-01 DIAGNOSIS — Z6830 Body mass index (BMI) 30.0-30.9, adult: Secondary | ICD-10-CM | POA: Diagnosis not present

## 2016-07-01 DIAGNOSIS — Z4681 Encounter for fitting and adjustment of insulin pump: Secondary | ICD-10-CM | POA: Diagnosis not present

## 2016-07-16 DIAGNOSIS — L409 Psoriasis, unspecified: Secondary | ICD-10-CM | POA: Diagnosis not present

## 2016-07-16 DIAGNOSIS — K219 Gastro-esophageal reflux disease without esophagitis: Secondary | ICD-10-CM | POA: Diagnosis not present

## 2016-07-16 DIAGNOSIS — K5900 Constipation, unspecified: Secondary | ICD-10-CM | POA: Diagnosis not present

## 2016-07-16 DIAGNOSIS — E1065 Type 1 diabetes mellitus with hyperglycemia: Secondary | ICD-10-CM | POA: Diagnosis not present

## 2016-07-16 DIAGNOSIS — R69 Illness, unspecified: Secondary | ICD-10-CM | POA: Diagnosis not present

## 2016-07-16 DIAGNOSIS — E1042 Type 1 diabetes mellitus with diabetic polyneuropathy: Secondary | ICD-10-CM | POA: Diagnosis not present

## 2016-07-16 DIAGNOSIS — J42 Unspecified chronic bronchitis: Secondary | ICD-10-CM | POA: Diagnosis not present

## 2016-07-16 DIAGNOSIS — E785 Hyperlipidemia, unspecified: Secondary | ICD-10-CM | POA: Diagnosis not present

## 2016-07-16 DIAGNOSIS — G8929 Other chronic pain: Secondary | ICD-10-CM | POA: Diagnosis not present

## 2016-07-20 DIAGNOSIS — L570 Actinic keratosis: Secondary | ICD-10-CM | POA: Diagnosis not present

## 2016-07-20 DIAGNOSIS — D1801 Hemangioma of skin and subcutaneous tissue: Secondary | ICD-10-CM | POA: Diagnosis not present

## 2016-07-20 DIAGNOSIS — Z85828 Personal history of other malignant neoplasm of skin: Secondary | ICD-10-CM | POA: Diagnosis not present

## 2016-07-20 DIAGNOSIS — L853 Xerosis cutis: Secondary | ICD-10-CM | POA: Diagnosis not present

## 2016-07-20 DIAGNOSIS — L821 Other seborrheic keratosis: Secondary | ICD-10-CM | POA: Diagnosis not present

## 2016-07-27 DIAGNOSIS — N401 Enlarged prostate with lower urinary tract symptoms: Secondary | ICD-10-CM | POA: Diagnosis not present

## 2016-07-29 ENCOUNTER — Emergency Department (HOSPITAL_COMMUNITY): Payer: Medicare HMO

## 2016-07-29 ENCOUNTER — Encounter (HOSPITAL_COMMUNITY): Payer: Self-pay | Admitting: *Deleted

## 2016-07-29 ENCOUNTER — Inpatient Hospital Stay (HOSPITAL_COMMUNITY)
Admission: EM | Admit: 2016-07-29 | Discharge: 2016-07-31 | DRG: 065 | Disposition: A | Discharge status: Home / Self Care | Payer: Medicare HMO | Attending: Internal Medicine | Admitting: Internal Medicine

## 2016-07-29 DIAGNOSIS — Z87891 Personal history of nicotine dependence: Secondary | ICD-10-CM

## 2016-07-29 DIAGNOSIS — Z9842 Cataract extraction status, left eye: Secondary | ICD-10-CM | POA: Diagnosis not present

## 2016-07-29 DIAGNOSIS — Z961 Presence of intraocular lens: Secondary | ICD-10-CM | POA: Diagnosis present

## 2016-07-29 DIAGNOSIS — Z791 Long term (current) use of non-steroidal anti-inflammatories (NSAID): Secondary | ICD-10-CM

## 2016-07-29 DIAGNOSIS — E785 Hyperlipidemia, unspecified: Secondary | ICD-10-CM | POA: Diagnosis present

## 2016-07-29 DIAGNOSIS — N183 Chronic kidney disease, stage 3 unspecified: Secondary | ICD-10-CM | POA: Diagnosis present

## 2016-07-29 DIAGNOSIS — Z8249 Family history of ischemic heart disease and other diseases of the circulatory system: Secondary | ICD-10-CM

## 2016-07-29 DIAGNOSIS — K219 Gastro-esophageal reflux disease without esophagitis: Secondary | ICD-10-CM | POA: Diagnosis present

## 2016-07-29 DIAGNOSIS — G459 Transient cerebral ischemic attack, unspecified: Secondary | ICD-10-CM

## 2016-07-29 DIAGNOSIS — I639 Cerebral infarction, unspecified: Principal | ICD-10-CM | POA: Diagnosis present

## 2016-07-29 DIAGNOSIS — Z9841 Cataract extraction status, right eye: Secondary | ICD-10-CM | POA: Diagnosis not present

## 2016-07-29 DIAGNOSIS — E1051 Type 1 diabetes mellitus with diabetic peripheral angiopathy without gangrene: Secondary | ICD-10-CM | POA: Diagnosis not present

## 2016-07-29 DIAGNOSIS — Z880 Allergy status to penicillin: Secondary | ICD-10-CM | POA: Diagnosis not present

## 2016-07-29 DIAGNOSIS — Z888 Allergy status to other drugs, medicaments and biological substances status: Secondary | ICD-10-CM

## 2016-07-29 DIAGNOSIS — Z794 Long term (current) use of insulin: Secondary | ICD-10-CM

## 2016-07-29 DIAGNOSIS — Z6831 Body mass index (BMI) 31.0-31.9, adult: Secondary | ICD-10-CM | POA: Diagnosis not present

## 2016-07-29 DIAGNOSIS — G8194 Hemiplegia, unspecified affecting left nondominant side: Secondary | ICD-10-CM | POA: Diagnosis present

## 2016-07-29 DIAGNOSIS — E781 Pure hyperglyceridemia: Secondary | ICD-10-CM | POA: Diagnosis not present

## 2016-07-29 DIAGNOSIS — E109 Type 1 diabetes mellitus without complications: Secondary | ICD-10-CM | POA: Diagnosis present

## 2016-07-29 DIAGNOSIS — E669 Obesity, unspecified: Secondary | ICD-10-CM | POA: Diagnosis present

## 2016-07-29 DIAGNOSIS — E1049 Type 1 diabetes mellitus with other diabetic neurological complication: Secondary | ICD-10-CM | POA: Diagnosis not present

## 2016-07-29 DIAGNOSIS — Z79899 Other long term (current) drug therapy: Secondary | ICD-10-CM | POA: Diagnosis not present

## 2016-07-29 DIAGNOSIS — E1022 Type 1 diabetes mellitus with diabetic chronic kidney disease: Secondary | ICD-10-CM | POA: Diagnosis not present

## 2016-07-29 DIAGNOSIS — R51 Headache: Secondary | ICD-10-CM | POA: Diagnosis not present

## 2016-07-29 DIAGNOSIS — Z9641 Presence of insulin pump (external) (internal): Secondary | ICD-10-CM | POA: Diagnosis present

## 2016-07-29 DIAGNOSIS — I129 Hypertensive chronic kidney disease with stage 1 through stage 4 chronic kidney disease, or unspecified chronic kidney disease: Secondary | ICD-10-CM | POA: Diagnosis not present

## 2016-07-29 DIAGNOSIS — Z833 Family history of diabetes mellitus: Secondary | ICD-10-CM

## 2016-07-29 DIAGNOSIS — E1122 Type 2 diabetes mellitus with diabetic chronic kidney disease: Secondary | ICD-10-CM | POA: Diagnosis not present

## 2016-07-29 DIAGNOSIS — I6300 Cerebral infarction due to thrombosis of unspecified precerebral artery: Secondary | ICD-10-CM | POA: Diagnosis not present

## 2016-07-29 DIAGNOSIS — Z886 Allergy status to analgesic agent status: Secondary | ICD-10-CM | POA: Diagnosis not present

## 2016-07-29 DIAGNOSIS — G458 Other transient cerebral ischemic attacks and related syndromes: Secondary | ICD-10-CM | POA: Diagnosis not present

## 2016-07-29 DIAGNOSIS — I10 Essential (primary) hypertension: Secondary | ICD-10-CM | POA: Diagnosis not present

## 2016-07-29 HISTORY — DX: Cerebral infarction, unspecified: I63.9

## 2016-07-29 HISTORY — DX: Personal history of other diseases of the musculoskeletal system and connective tissue: Z87.39

## 2016-07-29 HISTORY — DX: Chronic kidney disease, stage 3 (moderate): N18.3

## 2016-07-29 HISTORY — DX: Chronic kidney disease, stage 3 unspecified: N18.30

## 2016-07-29 HISTORY — DX: Squamous cell carcinoma of skin of right ear and external auricular canal: C44.222

## 2016-07-29 HISTORY — DX: Tinnitus, bilateral: H93.13

## 2016-07-29 HISTORY — DX: Essential (primary) hypertension: I10

## 2016-07-29 HISTORY — DX: Type 1 diabetes mellitus without complications: E10.9

## 2016-07-29 HISTORY — DX: Basal cell carcinoma of skin, unspecified: C44.91

## 2016-07-29 HISTORY — DX: Hyperlipidemia, unspecified: E78.5

## 2016-07-29 LAB — CBC
HCT: 43.8 % (ref 39.0–52.0)
Hemoglobin: 14.8 g/dL (ref 13.0–17.0)
MCH: 31 pg (ref 26.0–34.0)
MCHC: 33.8 g/dL (ref 30.0–36.0)
MCV: 91.6 fL (ref 78.0–100.0)
Platelets: 239 10*3/uL (ref 150–400)
RBC: 4.78 MIL/uL (ref 4.22–5.81)
RDW: 13.6 % (ref 11.5–15.5)
WBC: 8.5 10*3/uL (ref 4.0–10.5)

## 2016-07-29 LAB — COMPREHENSIVE METABOLIC PANEL
ALT: 40 U/L (ref 17–63)
AST: 33 U/L (ref 15–41)
Albumin: 4.3 g/dL (ref 3.5–5.0)
Alkaline Phosphatase: 69 U/L (ref 38–126)
Anion gap: 9 (ref 5–15)
BUN: 18 mg/dL (ref 6–20)
CO2: 25 mmol/L (ref 22–32)
Calcium: 9.2 mg/dL (ref 8.9–10.3)
Chloride: 104 mmol/L (ref 101–111)
Creatinine, Ser: 1.37 mg/dL — ABNORMAL HIGH (ref 0.61–1.24)
GFR calc Af Amer: 58 mL/min — ABNORMAL LOW (ref >60)
GFR calc non Af Amer: 50 mL/min — ABNORMAL LOW (ref >60)
Glucose, Bld: 194 mg/dL — ABNORMAL HIGH (ref 65–99)
Potassium: 4.7 mmol/L (ref 3.5–5.1)
Sodium: 138 mmol/L (ref 135–145)
Total Bilirubin: 0.4 mg/dL (ref 0.3–1.2)
Total Protein: 7.1 g/dL (ref 6.5–8.1)

## 2016-07-29 LAB — DIFFERENTIAL
Basophils Absolute: 0.1 10*3/uL (ref 0.0–0.1)
Basophils Relative: 1 %
Eosinophils Absolute: 0.4 10*3/uL (ref 0.0–0.7)
Eosinophils Relative: 5 %
Lymphocytes Relative: 17 %
Lymphs Abs: 1.5 10*3/uL (ref 0.7–4.0)
Monocytes Absolute: 0.8 10*3/uL (ref 0.1–1.0)
Monocytes Relative: 9 %
Neutro Abs: 5.8 10*3/uL (ref 1.7–7.7)
Neutrophils Relative %: 68 %

## 2016-07-29 LAB — CBG MONITORING, ED: Glucose-Capillary: 147 mg/dL — ABNORMAL HIGH (ref 65–99)

## 2016-07-29 LAB — PROTIME-INR
INR: 1.02
Prothrombin Time: 13.4 seconds (ref 11.4–15.2)

## 2016-07-29 LAB — APTT: aPTT: 33 seconds (ref 24–36)

## 2016-07-29 LAB — GLUCOSE, CAPILLARY: Glucose-Capillary: 115 mg/dL — ABNORMAL HIGH (ref 65–99)

## 2016-07-29 NOTE — H&P (Signed)
History and Physical    [name] [id number] DOB: 26-Aug-1945 DOA: 07/29/2016  PCP: Sheela Stack, MD Consultants:  Minus Liberty - urology; Clydene Laming - ophthalmologist; Baird Cancer - retina; Gwenlyn Found - cardiology Patient coming from: home - lives with wife; NOK: wife, son; [phone]  Chief Complaint: possible stroke  HPI: Randall Horton is a 71 y.o. male with medical history significant of DM on insulin pump, hypertriglyceridemia, GERD presenting because he felt like he was having something going on that wasn't supposed to be going on.  Called Dr. Forde Dandy and told them he was concerned he was having a stroke.  They sent him to the ER.  Wife has possible breast cancer and they were taking her to an appointment, but they diverted him here instead.  He says that he was working in his wood shop yesterday, got hungry about 1130 (diabetic).  Ate some cheese crackers and went to his house to get lunch.  Sat in recliner to watch the news and maybe take a nap, as usual.  Awoke about an hour later and noticed his left arm was asleep, tingling.  Left leg was also asleep.  Hard to get moving, didn't feel good.  "Had a funny feeling that I wasn't supposed to have."  Felt a little better so went back to work in his shop.  Came back and ate dinner, sat back down in recliner, played on the computer.  Slept pretty good.  Got up about 0300 to urinate but felt a little unsure with his steps, like he was dragging his left foot.  Slept a little later than usual.  Went with wife to her appointment and called Dr. Forde Dandy as above.    Currently, he is still feeling tingling in his left hand, in the skin on his left abdomen, and in left leg.  No facial symptoms.  This AM, he felt like the coffee cup he was holding didn't weigh anything, like he was super strong.  His motor function improves once he moves a bit and gets back into the rhythm.  No dysphagia or dysarthria.  No visual problems.  Maybe a bit of an occipital  headache.   ED Course: Per Dr. Thomasene Lot: Patient is a 71 year old male not on any blood thinners presenting today with left-sided strokelike symptoms. Patient reports that he has weakness/unsteadiness on his left hand side. Including walking. I'm concern today for stroke. He has a subtle left handed dysmetria. CT negative labs are otherwise reassuring. Pt is outside of TPA window. We'll get MRI.  mRI shows acute stroke. Discussed with neurology who will admit.    Review of Systems: As per HPI; otherwise 10 point review of systems reviewed and negative.   Ambulatory Status:  ambulates without assistance  Past Medical History:  Diagnosis Date  . Allergy   . Arthritis   . At risk for sleep apnea    STOP-BANG= 6     SENT TO PCP 06-25-2014  . Chronic cough    FORMER SMOKER  . Diabetes mellitus type 2, insulin dependent ([place])    pt denies, states type 1 diabetes  . Environmental allergies   . GERD (gastroesophageal reflux disease)   . H/O cardiovascular stress test 2013   "everything fine" per pt  . High triglycerides   . History of diabetes with ketoacidosis    1980'S  . History of hiatal hernia   . Incomplete emptying of bladder   . Insulin pump in place   . Pain in right  hip    injury 2015  . Tachycardia   . Tinnitus   . Urgency of urination   . Wears glasses   . Wears partial dentures    UPPER AND LOWER    Past Surgical History:  Procedure Laterality Date  . CATARACT EXTRACTION W/ INTRAOCULAR LENS  IMPLANT, BILATERAL  2004 & 2005  . CIRCUMCISION  1960's   with Hernia Repair   . COLONOSCOPY    . CYSTOSCOPY WITH URETHRAL DILATATION N/A 11/02/2014   Procedure: CYSTOSCOPY WITH URETHRAL DILATATION AND  POSSIBLE COOK DILATIION;  Surgeon: Carolan Clines, MD;  Location: [facility];  Service: Urology;  Laterality: N/A;  . ESOPHAGOGASTRODUODENOSCOPY ENDOSCOPY  2009  . INGUINAL HERNIA REPAIR Bilateral 1950's & 1960's  . TRANSURETHRAL RESECTION OF PROSTATE N/A  06/28/2014   Procedure: TRANSURETHRAL RESECTION OF THE PROSTATE (TURP);  Surgeon: Ailene Rud, MD;  Location[facility];  Service: Urology;  Laterality: N/A;    Social History   Social History  . Marital status: Married    Spouse name: N/A  . Number of children: N/A  . Years of education: N/A   Occupational History  . retired    Social History Main Topics  . Smoking status: Former Smoker    Types: Cigarettes    Quit date: 06/22/1977  . Smokeless tobacco: Current User    Types: Snuff  . Alcohol use No  . Drug use: No  . Sexual activity: Not on file   Other Topics Concern  . Not on file   Social History Narrative  . No narrative on file    Allergies  Allergen Reactions  . Aspirin Other (See Comments)    Severe stomach cramps + constipation.   . Niacin And Related Other (See Comments)    "flushing"  . Penicillins Hives, Itching and Swelling    Has patient had a PCN reaction causing immediate rash, facial/tongue/throat swelling, SOB or lightheadedness with hypotension: YES Has patient had a PCN reaction causing severe rash involving mucus membranes or skin necrosis: NO Has patient had a PCN reaction that required hospitalization NO Has patient had a PCN reaction occurring within the last 10 years: NO If all of the above answers are "NO", then may proceed with Cephalosporin use.    Family History  Problem Relation Age of Onset  . Heart disease Mother   . Diabetes Father   . Colon cancer Neg Hx     Prior to Admission medications   Medication Sig Start Date End Date Taking? Authorizing Provider  acetaminophen (TYLENOL) 500 MG tablet Take 1,000 mg by mouth every 6 (six) hours as needed for moderate pain or headache.   Yes Historical Provider, MD  allopurinol (ZYLOPRIM) 300 MG tablet Take 300 mg by mouth every morning.   Yes Historical Provider, MD  clobetasol cream (TEMOVATE) [id number] % Apply 1 application topically daily as needed (rash on chin.).    Yes Historical Provider, MD  diphenhydrAMINE (BENADRYL) 25 MG tablet Take 25 mg by mouth at bedtime as needed for itching (around pump site.).   Yes Historical Provider, MD  docusate sodium (COLACE) 100 MG capsule Take 100 mg by mouth 2 (two) times daily as needed for mild constipation.   Yes Historical Provider, MD  fenofibrate 160 MG tablet Take 160 mg by mouth every evening.   Yes Historical Provider, MD  gabapentin (NEURONTIN) 100 MG capsule Take 200 mg by mouth 2 (two) times daily.   Yes Historical Provider, MD  guaiFENesin (MUCINEX) 600 MG 12 hr tablet Take 600-1,200 mg by mouth 2 (two) times daily as needed for cough or to loosen phlegm.   Yes Historical Provider, MD  hydrocortisone 1 % ointment Apply 1 application topically daily. APPLY TO EARS   Yes Historical Provider, MD  ibuprofen (ADVIL,MOTRIN) 200 MG tablet Take 600 mg by mouth every 6 (six) hours as needed for moderate pain.    Yes Historical Provider, MD  Insulin Human (INSULIN PUMP) SOLN Inject 110-120 each into the skin daily. Novolog.   Yes Historical Provider, MD  loratadine (CLARITIN) 10 MG tablet Take 10 mg by mouth 2 (two) times daily.    Yes Historical Provider, MD  meloxicam (MOBIC) 15 MG tablet Take 15 mg by mouth daily. 07/08/15  Yes Historical Provider, MD  montelukast (SINGULAIR) 10 MG tablet Take 10 mg by mouth every evening.   Yes Historical Provider, MD  omeprazole (PRILOSEC OTC) 20 MG tablet Take 20 mg by mouth every morning.   Yes Historical Provider, MD  Pramoxine-Benzyl Alcohol (ITCH-X EX) Apply 1 application topically daily as needed. For itching   Yes Historical Provider, MD  Turmeric 500 MG CAPS Take 1 capsule by mouth 2 (two) times daily.    Yes Historical Provider, MD    Physical Exam: Vitals:   07/29/16 2200 07/29/16 2230 07/29/16 2300 07/29/16 2330  BP: 133/78 127/71 158/87 138/71  Pulse: 84 92 102 85  Resp: 10 13 17 18   Temp:    98.2 F (36.8 C)  TempSrc:    Oral  SpO2: 98% 95% 98% 98%  Weight:     101.6 kg (224 lb)  Height:    5\' 11"  (1.803 m)     General:  Appears calm and comfortable and is NAD Eyes:  PERRL, EOMI, normal lids, iris ENT:  grossly normal hearing, lips & tongue, mmm Neck:  no LAD, masses or thyromegaly Cardiovascular:  RRR, no m/r/g. No LE edema.  Respiratory:  CTA bilaterally, no w/r/r. Normal respiratory effort. Abdomen:  soft, ntnd, NABS Skin:  no rash or induration seen on limited exam Musculoskeletal:  grossly normal tone BUE/BLE, good ROM, no bony abnormality Psychiatric:  grossly normal mood and affect, speech fluent and appropriate, AOx3 Neurologic:  CN 2-12 grossly intact, moves all extremities in coordinated fashion, sensation intact vs. Minimally diminished but much improved on left hand and leg/foot  Labs on Admission: I have personally reviewed following labs and imaging studies  CBC:  Recent Labs Lab 07/29/16 1221  WBC 8.5  NEUTROABS 5.8  HGB 14.8  HCT 43.8  MCV 91.6  PLT [id number]   Basic Metabolic Panel:  Recent Labs Lab 07/29/16 1221  NA 138  K 4.7  CL 104  CO2 25  GLUCOSE 194*  BUN 18  CREATININE 1.37*  CALCIUM 9.2   GFR: Estimated Creatinine Clearance: 59.1 mL/min (by C-G formula based on SCr of 1.37 mg/dL (H)). Liver Function Tests:  Recent Labs Lab 07/29/16 1221  AST 33  ALT 40  ALKPHOS 69  BILITOT 0.4  PROT 7.1  ALBUMIN 4.3   No results for input(s): LIPASE, AMYLASE in the last 168 hours. No results for input(s): AMMONIA in the last 168 hours. Coagulation Profile:  Recent Labs Lab 07/29/16 1221  INR 1.02   Cardiac Enzymes: No results for input(s): CKTOTAL, CKMB, CKMBINDEX, TROPONINI in the last 168 hours. BNP (last 3 results) No results for input(s): PROBNP in the last 8760 hours. HbA1C: No results for input(s): HGBA1C in the last  72 hours. CBG:  Recent Labs Lab 07/29/16 1514 07/29/16 2350  GLUCAP 147* 115*   Lipid Profile: No results for input(s): CHOL, HDL, LDLCALC, TRIG, CHOLHDL, LDLDIRECT in  the last 72 hours. Thyroid Function Tests: No results for input(s): TSH, T4TOTAL, FREET4, T3FREE, THYROIDAB in the last 72 hours. Anemia Panel: No results for input(s): VITAMINB12, FOLATE, FERRITIN, TIBC, IRON, RETICCTPCT in the last 72 hours. Urine analysis: No results found for: COLORURINE, APPEARANCEUR, LABSPEC, PHURINE, GLUCOSEU, HGBUR, BILIRUBINUR, KETONESUR, PROTEINUR, UROBILINOGEN, NITRITE, LEUKOCYTESUR  Creatinine Clearance: Estimated Creatinine Clearance: 59.1 mL/min (by C-G formula based on SCr of 1.37 mg/dL (H)).  Sepsis Labs: @LABRCNTIP (procalcitonin:4,lacticidven:4) )No results found for this or any previous visit (from the past 240 hour(s)).   Radiological Exams on Admission: Ct Head Wo Contrast  Result Date: 07/29/2016 CLINICAL DATA:  Acute onset left-sided numbness with headache EXAM: CT HEAD WITHOUT CONTRAST TECHNIQUE: Contiguous axial images were obtained from the base of the skull through the vertex without intravenous contrast. COMPARISON:  None. FINDINGS: Brain: The ventricles are normal in size and configuration. There is no intracranial mass, hemorrhage, extra-axial fluid collection, or midline shift. There is minimal small vessel disease in the centra semiovale bilaterally. Elsewhere gray-white compartments appear normal. No acute infarct is evident. Vascular: There is no hyperdense vessel. No appreciable arterial vascular calcification is evident. Skull: Bony calvarium appears intact. Sinuses/Orbits: Visualized paranasal sinuses are clear. No intraorbital lesions evident. Other: Mastoid air cells are clear. IMPRESSION: Minimal periventricular small vessel disease. No intracranial mass, hemorrhage, or extra-axial fluid collection. No acute appearing infarct. Study otherwise unremarkable. Electronically Signed   By: Lowella Grip III M.D.   On: 07/29/2016 12:47   Mr Brain Wo Contrast  Result Date: 07/29/2016 CLINICAL DATA:  Left-sided dysmetria in the arm and leg EXAM:  MRI HEAD WITHOUT CONTRAST TECHNIQUE: Multiplanar, multiecho pulse sequences of the brain and surrounding structures were obtained without intravenous contrast. COMPARISON:  Head CT 07/29/2016 FINDINGS: Brain: There is a focus of diffusion restriction at the lateral aspect of the right thalamus, likely involving the posterior limb of the right internal capsule. This focus measures up to 9 mm. There is a punctate focus of diffusion restriction in a corresponding location on the left. There is an old right caudate lacunar infarct. No evidence of hemorrhage. No mass lesion or midline shift. No hydrocephalus or extra-axial fluid collection. The midline structures are normal. No age advanced or lobar predominant atrophy. Vascular: Major intracranial arterial and venous sinus flow voids are preserved. No evidence of chronic microhemorrhage or amyloid angiopathy. Skull and upper cervical spine: The visualized skull base, calvarium, upper cervical spine and extracranial soft tissues are normal. Sinuses/Orbits: No fluid levels or advanced mucosal thickening. No mastoid effusion. Normal orbits. IMPRESSION: 1. Small acute lacunar infarct of the posterior limb right internal capsule, in keeping with reported left-sided symptoms. No mass effect or hemorrhage. 2. Suspected punctate focus of acute ischemia of the posterior limb of the left internal capsule without mass effect or hemorrhage. Electronically Signed   By: Ulyses Jarred M.D.   On: 07/29/2016 20:15    EKG: Independently reviewed.  NSR with rate 79;normal EKG  Assessment/Plan Principal Problem:   CVA (cerebral vascular accident) ([place]) Active Problems:   Type 1 diabetes mellitus ([place])   Essential hypertension   Hyperlipidemia   CKD stage 3 secondary to diabetes ([place])   CVA -Symptoms consistent with CVA  -MRI shows small lacunar acute infarct of the posterior limb of the internal capsule on the right,  c/w left-sided symptoms; there is also a suspected  punctate focus of acute ischemia of the posterior limb of the left internal capsule -Will admit for further CVA evaluation -Telemetry monitoring -MRA -Carotid dopplers -Echo -Risk stratification with FLP, A1c; will also check TSH and UDS -Unable to take ASA due to GI issues in the past so will try Plavix  Daily with BID Protonix for GI prophylaxis -PT/OT/ST/Nutrition Consults -Unlikely to need placement  HTN -Allow permissive HTN -Treat BP only if >220/120, and then with goal of 15% reduction -Does not appear to be taking BP meds on a chronic basis  HLD -Check FLP -Will plan to start statin therapy empirically for now, Lipitor 40 mg qhs  DM -Glucose 194 -Uses insulin pump -Reports that he prefers to have glucose in 140 range since <100 makes him feel profoundly hypoglycemic -Last A1c was 7.2 in 1/16; this is suboptimal and may increase the risk for future strokes -Diabetic education requested  CKD -Creatinine 1.37, prior 1.52 on 06/29/14   DVT prophylaxis:  Lovenox  Code Status:  Full - confirmed with patient Family Communication: None present Disposition Plan:  Home once clinically improved Consults called: Neurology; PT/OT/ST/Nutrition; Diabetes Coordinator  Admission status: Admit - It is my clinical opinion that admission to [facility] is reasonable and necessary because this patient will require at least 2 midnights in the hospital to treat this condition based on the medical complexity of the problems presented.  Given the aforementioned information, the predictability of an adverse outcome is felt to be significant.     Karmen Bongo MD [facility]  If 7PM-7AM, please contact night-coverage [email or web address] Password [email or web address]  07/30/2016, 1:01 AM

## 2016-07-29 NOTE — ED Notes (Signed)
CBG 147 

## 2016-07-29 NOTE — ED Provider Notes (Signed)
[place] DEPT Provider Note   CSN: [id number] Arrival date & time: 07/29/16  1030     History   Chief Complaint Chief Complaint  Patient presents with  . Weakness    HPI Randall Horton is a 71 y.o. male.  HPI   This is a 71 year old male presenting with stroke like symptoms- left-sided complaints. He states that his left foot and left hand started feeling different yesterday. He reports mild unsteadiness in his gait. He reports this happened after he woke up from a nap last yesterday. Patient reports at the symptoms got a little bit better but still residual.   Patient is not on Plavix or aspirin secondary to GI complaints.  Past Medical History:  Diagnosis Date  . Allergy   . Arthritis   . At risk for sleep apnea    STOP-BANG= 6     SENT TO PCP 06-25-2014  . Chronic cough    FORMER SMOKER  . Diabetes mellitus type 2, insulin dependent ([place])    pt denies, states type 1 diabetes  . Environmental allergies   . GERD (gastroesophageal reflux disease)   . H/O cardiovascular stress test 2013   "everything fine" per pt  . High triglycerides   . History of diabetes with ketoacidosis    1980'S  . History of hiatal hernia   . Incomplete emptying of bladder   . Insulin pump in place   . Pain in right hip    injury 2015  . Tachycardia   . Tinnitus   . Urgency of urination   . Wears glasses   . Wears partial dentures    UPPER AND LOWER    Patient Active Problem List   Diagnosis Date Noted  . Benign hypertrophy of prostate 06/28/2014  . IDDM 05/22/2008  . GERD 05/22/2008  . DYSPHAGIA 05/22/2008    Past Surgical History:  Procedure Laterality Date  . CATARACT EXTRACTION W/ INTRAOCULAR LENS  IMPLANT, BILATERAL  2004 & 2005  . CIRCUMCISION  1960's   with Hernia Repair   . COLONOSCOPY    . CYSTOSCOPY WITH URETHRAL DILATATION N/A 11/02/2014   Procedure: CYSTOSCOPY WITH URETHRAL DILATATION AND  POSSIBLE COOK DILATIION;  Surgeon: Carolan Clines, MD;  Location:  [place];  Service: Urology;  Laterality: N/A;  . ESOPHAGOGASTRODUODENOSCOPY ENDOSCOPY  2009  . INGUINAL HERNIA REPAIR Bilateral 1950's & 1960's  . TRANSURETHRAL RESECTION OF PROSTATE N/A 06/28/2014   Procedure: TRANSURETHRAL RESECTION OF THE PROSTATE (TURP);  Surgeon: Ailene Rud, MD;  Location: [facility];  Service: Urology;  Laterality: N/A;       Home Medications    Prior to Admission medications   Medication Sig Start Date End Date Taking? Authorizing Provider  acetaminophen (TYLENOL) 500 MG tablet Take 1,000 mg by mouth every 6 (six) hours as needed for moderate pain or headache.   Yes Historical Provider, MD  allopurinol (ZYLOPRIM) 300 MG tablet Take 300 mg by mouth every morning.   Yes Historical Provider, MD  clobetasol cream (TEMOVATE) [id number] % Apply 1 application topically daily as needed (rash on chin.).   Yes Historical Provider, MD  diphenhydrAMINE (BENADRYL) 25 MG tablet Take 25 mg by mouth at bedtime as needed for itching (around pump site.).   Yes Historical Provider, MD  docusate sodium (COLACE) 100 MG capsule Take 100 mg by mouth 2 (two) times daily as needed for mild constipation.   Yes Historical Provider, MD  fenofibrate 160 MG tablet Take 160 mg  by mouth every evening.   Yes Historical Provider, MD  gabapentin (NEURONTIN) 100 MG capsule Take 200 mg by mouth 2 (two) times daily.   Yes Historical Provider, MD  guaiFENesin (MUCINEX) 600 MG 12 hr tablet Take 600-1,200 mg by mouth 2 (two) times daily as needed for cough or to loosen phlegm.   Yes Historical Provider, MD  hydrocortisone 1 % ointment Apply 1 application topically daily. APPLY TO EARS   Yes Historical Provider, MD  ibuprofen (ADVIL,MOTRIN) 200 MG tablet Take 600 mg by mouth every 6 (six) hours as needed for moderate pain.    Yes Historical Provider, MD  Insulin Human (INSULIN PUMP) SOLN Inject 110-120 each into the skin daily. Novolog.   Yes Historical Provider, MD    loratadine (CLARITIN) 10 MG tablet Take 10 mg by mouth 2 (two) times daily.    Yes Historical Provider, MD  meloxicam (MOBIC) 15 MG tablet Take 15 mg by mouth daily. 07/08/15  Yes Historical Provider, MD  montelukast (SINGULAIR) 10 MG tablet Take 10 mg by mouth every evening.   Yes Historical Provider, MD  omeprazole (PRILOSEC OTC) 20 MG tablet Take 20 mg by mouth every morning.   Yes Historical Provider, MD  Pramoxine-Benzyl Alcohol (ITCH-X EX) Apply 1 application topically daily as needed. For itching   Yes Historical Provider, MD  Turmeric 500 MG CAPS Take 1 capsule by mouth 2 (two) times daily.    Yes Historical Provider, MD    Family History Family History  Problem Relation Age of Onset  . Heart disease Mother   . Diabetes Father   . Colon cancer Neg Hx     Social History Social History  Substance Use Topics  . Smoking status: Former Smoker    Types: Cigarettes    Quit date: 06/22/1968  . Smokeless tobacco: Current User    Types: Snuff  . Alcohol use No     Allergies   Aspirin; Niacin and related; and Penicillins   Review of Systems Review of Systems  Constitutional: Negative for activity change, fatigue and fever.  Respiratory: Negative for shortness of breath.   Cardiovascular: Negative for chest pain.  Gastrointestinal: Negative for abdominal pain.  Musculoskeletal: Negative for back pain.  Neurological: Positive for weakness. Negative for dizziness, numbness and headaches.  All other systems reviewed and are negative.    Physical Exam Updated Vital Signs BP 135/76   Pulse 76   Temp 97.7 F (36.5 C) (Oral)   Resp 13   SpO2 97%   Physical Exam  Constitutional: He is oriented to person, place, and time. He appears well-nourished.  HENT:  Head: Normocephalic.  Eyes: Conjunctivae and EOM are normal. Pupils are equal, round, and reactive to light. Right eye exhibits no discharge. Left eye exhibits no discharge.  Cardiovascular: Normal rate and regular  rhythm.   Pulmonary/Chest: Effort normal. No respiratory distress. He has no wheezes.  Neurological: He is alert and oriented to person, place, and time. A cranial nerve deficit is present. Coordination abnormal.  Patient has mild dysmetria in the left upper extremity. Patient also has mild unsteady gait.  Renal nerves II through XII appear intact and patient has negative pronator drift.  Skin: Skin is warm and dry. He is not diaphoretic.  Psychiatric: He has a normal mood and affect. His behavior is normal.     ED Treatments / Results  Labs (all labs ordered are listed, but only abnormal results are displayed) Labs Reviewed  COMPREHENSIVE METABOLIC PANEL - Abnormal;  Notable for the following:       Result Value   Glucose, Bld 194 (*)    Creatinine, Ser 1.37 (*)    GFR calc non Af Amer 50 (*)    GFR calc Af Amer 58 (*)    All other components within normal limits  CBG MONITORING, ED - Abnormal; Notable for the following:    Glucose-Capillary 147 (*)    All other components within normal limits  PROTIME-INR  APTT  CBC  DIFFERENTIAL    EKG  EKG Interpretation  Date/Time:  Wednesday July 29 2016 12:15:42 EST Ventricular Rate:  79 PR Interval:  178 QRS Duration: 92 QT Interval:  380 QTC Calculation: 435 R Axis:   43 Text Interpretation:  Normal sinus rhythm Normal ECG Confirmed by RAY MD, Andee Poles ([id number]) on 07/29/2016 3:24:02 PM       Radiology Ct Head Wo Contrast  Result Date: 07/29/2016 CLINICAL DATA:  Acute onset left-sided numbness with headache EXAM: CT HEAD WITHOUT CONTRAST TECHNIQUE: Contiguous axial images were obtained from the base of the skull through the vertex without intravenous contrast. COMPARISON:  None. FINDINGS: Brain: The ventricles are normal in size and configuration. There is no intracranial mass, hemorrhage, extra-axial fluid collection, or midline shift. There is minimal small vessel disease in the centra semiovale bilaterally. Elsewhere  gray-white compartments appear normal. No acute infarct is evident. Vascular: There is no hyperdense vessel. No appreciable arterial vascular calcification is evident. Skull: Bony calvarium appears intact. Sinuses/Orbits: Visualized paranasal sinuses are clear. No intraorbital lesions evident. Other: Mastoid air cells are clear. IMPRESSION: Minimal periventricular small vessel disease. No intracranial mass, hemorrhage, or extra-axial fluid collection. No acute appearing infarct. Study otherwise unremarkable. Electronically Signed   By: Lowella Grip III M.D.   On: 07/29/2016 12:47   Mr Brain Wo Contrast  Result Date: 07/29/2016 CLINICAL DATA:  Left-sided dysmetria in the arm and leg EXAM: MRI HEAD WITHOUT CONTRAST TECHNIQUE: Multiplanar, multiecho pulse sequences of the brain and surrounding structures were obtained without intravenous contrast. COMPARISON:  Head CT 07/29/2016 FINDINGS: Brain: There is a focus of diffusion restriction at the lateral aspect of the right thalamus, likely involving the posterior limb of the right internal capsule. This focus measures up to 9 mm. There is a punctate focus of diffusion restriction in a corresponding location on the left. There is an old right caudate lacunar infarct. No evidence of hemorrhage. No mass lesion or midline shift. No hydrocephalus or extra-axial fluid collection. The midline structures are normal. No age advanced or lobar predominant atrophy. Vascular: Major intracranial arterial and venous sinus flow voids are preserved. No evidence of chronic microhemorrhage or amyloid angiopathy. Skull and upper cervical spine: The visualized skull base, calvarium, upper cervical spine and extracranial soft tissues are normal. Sinuses/Orbits: No fluid levels or advanced mucosal thickening. No mastoid effusion. Normal orbits. IMPRESSION: 1. Small acute lacunar infarct of the posterior limb right internal capsule, in keeping with reported left-sided symptoms. No mass  effect or hemorrhage. 2. Suspected punctate focus of acute ischemia of the posterior limb of the left internal capsule without mass effect or hemorrhage. Electronically Signed   By: Ulyses Jarred M.D.   On: 07/29/2016 20:15    Procedures Procedures (including critical care time)  Medications Ordered in ED Medications - No data to display   Initial Impression / Assessment and Plan / ED Course  I have reviewed the triage vital signs and the nursing notes.  Pertinent labs & imaging results that were  available during my care of the patient were reviewed by me and considered in my medical decision making (see chart for details).     Patient is a 71 year old male not on any blood thinners presenting today with left-sided strokelike symptoms. Patient reports that he has weakness/unsteadiness on his left hand side. Including walking. I'm concern today for stroke. He has a subtle left handed dysmetria. CT negative labs are otherwise reassuring. Pt is outside of TPA window. We'll get MRI.  mRI shows acute stroke. Discussed with neurology who will admit.  Final Clinical Impressions(s) / ED Diagnoses   Final diagnoses:  Cerebrovascular accident (CVA), unspecified mechanism ([place])    New Prescriptions New Prescriptions   No medications on file     Joory Gough Julio Alm, MD 07/29/16 2204

## 2016-07-29 NOTE — ED Triage Notes (Signed)
Pt reports noticing numbness in his left hand and foot starting yesterday. Pt state that he is also having "coordination" problems. Pt alert and oriented in triage. Pt noted to have an unsteady gait in triage.

## 2016-07-29 NOTE — Consult Note (Signed)
Referring Physician: Dr. Thomasene Lot    Chief Complaint: Left hand and foot numbness  HPI: Randall Horton is an 71 y.o. male who presented with left sided incoordination and left face/arm/leg sensory numbness with paresthesias. The symptoms were sudden in onset and began on Wednesday, first noticed upon awakening from a nap. He had no focal weakness. He states that when he picked up a coffee pot with his left hand it suddenly went up much higher than normal as though his left arm was out of control. He had an unsteady gait when he was seen in triage. He used to take Plavix but stopped it for a reason he cannot remember. He states that he is allergic to ASA as it causes gastric pain and a sensation of feeling like he is about to sweat.   MRI was obtained, revealing subacute infarction in the posterior limb of the right internal capsule. 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 Neurology was called to further evaluate.   Past Medical History:  Diagnosis Date  . Allergy   . Arthritis   . At risk for sleep apnea    STOP-BANG= 6     SENT TO PCP 06-25-2014  . Chronic cough    FORMER SMOKER  . Diabetes mellitus type 2, insulin dependent ([place])    pt denies, states type 1 diabetes  . Environmental allergies   . GERD (gastroesophageal reflux disease)   . H/O cardiovascular stress test 2013   "everything fine" per pt  . High triglycerides   . History of diabetes with ketoacidosis    1980'S  . History of hiatal hernia   . Incomplete emptying of bladder   . Insulin pump in place   . Pain in right hip    injury 2015  . Tachycardia   . Tinnitus   . Urgency of urination   . Wears glasses   . Wears partial dentures    UPPER AND LOWER    Past Surgical History:  Procedure Laterality Date  . CATARACT EXTRACTION W/ INTRAOCULAR LENS  IMPLANT, BILATERAL  2004 & 2005  . CIRCUMCISION  1960's   with Hernia Repair   . COLONOSCOPY    .  CYSTOSCOPY WITH URETHRAL DILATATION N/A 11/02/2014   Procedure: CYSTOSCOPY WITH URETHRAL DILATATION AND  POSSIBLE COOK DILATIION;  Surgeon: Carolan Clines, MD;  Location: [place];  Service: Urology;  Laterality: N/A;  . ESOPHAGOGASTRODUODENOSCOPY ENDOSCOPY  2009  . INGUINAL HERNIA REPAIR Bilateral 1950's & 1960's  . TRANSURETHRAL RESECTION OF PROSTATE N/A 06/28/2014   Procedure: TRANSURETHRAL RESECTION OF THE PROSTATE (TURP);  Surgeon: Ailene Rud, MD;  Location: [facility];  Service: Urology;  Laterality: N/A;    Family History  Problem Relation Age of Onset  . Heart disease Mother   . Diabetes Father   . Colon cancer Neg Hx    Social History:  reports that he quit smoking about 48 years ago. His smoking use included Cigarettes. His smokeless tobacco use includes Snuff. He reports that he does not drink alcohol or use drugs.  Allergies:  Allergies  Allergen Reactions  . Aspirin Other (See Comments)    Severe stomach cramps + constipation.   . Niacin And Related Other (See Comments)    "flushing"  . Penicillins Hives, Itching and Swelling    Has patient had a PCN reaction causing immediate rash, facial/tongue/throat swelling, SOB or lightheadedness with hypotension: YES Has  patient had a PCN reaction causing severe rash involving mucus membranes or skin necrosis: NO Has patient had a PCN reaction that required hospitalization NO Has patient had a PCN reaction occurring within the last 10 years: NO If all of the above answers are "NO", then may proceed with Cephalosporin use.    Medications:   Current Facility-Administered Medications:  .   stroke: mapping our early stages of recovery book, , Does not apply, Once, Karmen Bongo, MD .  0.9 %  sodium chloride infusion, , Intravenous, Continuous, Karmen Bongo, MD, Last Rate: 50 mL/hr at 07/30/16 0023 .  acetaminophen (TYLENOL) tablet 650 mg, 650 mg, Oral, Q4H PRN **OR** acetaminophen  (TYLENOL) solution 650 mg, 650 mg, Per Tube, Q4H PRN **OR** acetaminophen (TYLENOL) suppository 650 mg, 650 mg, Rectal, Q4H PRN, Karmen Bongo, MD .  allopurinol (ZYLOPRIM) tablet 300 mg, 300 mg, Oral, q morning - 10a, Karmen Bongo, MD .  atorvastatin (LIPITOR) tablet 40 mg, 40 mg, Oral, q1800, Karmen Bongo, MD .  clopidogrel (PLAVIX) tablet 75 mg, 75 mg, Oral, Daily, Karmen Bongo, MD .  diphenhydrAMINE (BENADRYL) capsule 25 mg, 25 mg, Oral, QHS PRN, Karmen Bongo, MD .  docusate sodium (COLACE) capsule 100 mg, 100 mg, Oral, BID PRN, Karmen Bongo, MD .  enoxaparin (LOVENOX) injection 40 mg, 40 mg, Subcutaneous, Daily, Karmen Bongo, MD .  fenofibrate tablet 160 mg, 160 mg, Oral, QPM, Karmen Bongo, MD .  gabapentin (NEURONTIN) capsule 200 mg, 200 mg, Oral, BID, Karmen Bongo, MD, 200 mg at 07/30/16 0011 .  guaiFENesin (MUCINEX) 12 hr tablet 600-1,200 mg, 600-1,200 mg, Oral, BID PRN, Karmen Bongo, MD .  hydrocortisone 1 % ointment 1 application, 1 application, Topical, Daily, Karmen Bongo, MD .  insulin pump, , Subcutaneous, TID AC, HS, 0200, Karmen Bongo, MD .  loratadine (CLARITIN) tablet 10 mg, 10 mg, Oral, BID, Karmen Bongo, MD .  montelukast (SINGULAIR) tablet 10 mg, 10 mg, Oral, QPM, Karmen Bongo, MD .  pantoprazole (PROTONIX) EC tablet 40 mg, 40 mg, Oral, BID, Karmen Bongo, MD   ROS: No headache at time of symptoms. Had posterior neck ache at the base of the skull. No chest pain, SOB but did have some nausea. No confusion, dysarthria or dysphasia.   Physical Examination: Blood pressure 135/76, pulse 76, temperature 97.7 F (36.5 C), temperature source Oral, resp. rate 13, SpO2 97 %.  HEENT: [place]/AT Lungs: No gross wheezing. Respirations unlabored.  Ext: No edema.  Neurologic Examination: Ment: Alert and fully oriented. Pleasant and cooperative. Speech fluent with intact comprehension, naming and repetition.  CN: PERRL. Visual fields intact. EOMI without  nystagmus. Facial temp sensation normal bilaterally. VII - symmetric. Hearing intact to conversation. No hypophonia. Shoulder shrug equal bilaterally. Tongue midline.  Motor: 5/5 x 4 proximally and distally. Tone normal.  Sensory: Mildly decreased temperature sensation to LUE and LLE. FT intact without extinction.  Reflexes: 1+ brachioradialis bilaterally. Absent left achilles, 1+ left patella. 1+ right achilles, 2+ right patella. Toes downgoing.  Cerebellar: Mild dyssinergia with FNF on left; normal on right. Mild disdiadochokinisia on left, normal on right.  Gait: Deferred.   Results for orders placed or performed during the hospital encounter of 07/29/16 (from the past 48 hour(s))  Protime-INR     Status: None   Collection Time: 07/29/16 12:21 PM  Result Value Ref Range   Prothrombin Time 13.4 11.4 - 15.2 seconds   INR 1.02   APTT     Status: None   Collection Time: 07/29/16 12:21  PM  Result Value Ref Range   aPTT 33 24 - 36 seconds  CBC     Status: None   Collection Time: 07/29/16 12:21 PM  Result Value Ref Range   WBC 8.5 4.0 - 10.5 K/uL   RBC 4.78 4.22 - 5.81 MIL/uL   Hemoglobin 14.8 13.0 - 17.0 g/dL   HCT 43.8 39.0 - 52.0 %   MCV 91.6 78.0 - 100.0 fL   MCH 31.0 26.0 - 34.0 pg   MCHC 33.8 30.0 - 36.0 g/dL   RDW 13.6 11.5 - 15.5 %   Platelets 239 150 - 400 K/uL  Differential     Status: None   Collection Time: 07/29/16 12:21 PM  Result Value Ref Range   Neutrophils Relative % 68 %   Neutro Abs 5.8 1.7 - 7.7 K/uL   Lymphocytes Relative 17 %   Lymphs Abs 1.5 0.7 - 4.0 K/uL   Monocytes Relative 9 %   Monocytes Absolute 0.8 0.1 - 1.0 K/uL   Eosinophils Relative 5 %   Eosinophils Absolute 0.4 0.0 - 0.7 K/uL   Basophils Relative 1 %   Basophils Absolute 0.1 0.0 - 0.1 K/uL  Comprehensive metabolic panel     Status: Abnormal   Collection Time: 07/29/16 12:21 PM  Result Value Ref Range   Sodium 138 135 - 145 mmol/L   Potassium 4.7 3.5 - 5.1 mmol/L   Chloride 104 101 - 111  mmol/L   CO2 25 22 - 32 mmol/L   Glucose, Bld 194 (H) 65 - 99 mg/dL   BUN 18 6 - 20 mg/dL   Creatinine, Ser 1.37 (H) 0.61 - 1.24 mg/dL   Calcium 9.2 8.9 - 10.3 mg/dL   Total Protein 7.1 6.5 - 8.1 g/dL   Albumin 4.3 3.5 - 5.0 g/dL   AST 33 15 - 41 U/L   ALT 40 17 - 63 U/L   Alkaline Phosphatase 69 38 - 126 U/L   Total Bilirubin 0.4 0.3 - 1.2 mg/dL   GFR calc non Af Amer 50 (L) >60 mL/min   GFR calc Af Amer 58 (L) >60 mL/min    Comment: (NOTE) The eGFR has been calculated using the CKD EPI equation. This calculation has not been validated in all clinical situations. eGFR's persistently <60 mL/min signify possible Chronic Kidney Disease.    Anion gap 9 5 - 15  CBG monitoring, ED     Status: Abnormal   Collection Time: 07/29/16  3:14 PM  Result Value Ref Range   Glucose-Capillary 147 (H) 65 - 99 mg/dL   Comment 1 Notify RN    Comment 2 Document in Chart    Ct Head Wo Contrast  Result Date: 07/29/2016 CLINICAL DATA:  Acute onset left-sided numbness with headache EXAM: CT HEAD WITHOUT CONTRAST TECHNIQUE: Contiguous axial images were obtained from the base of the skull through the vertex without intravenous contrast. COMPARISON:  None. FINDINGS: Brain: The ventricles are normal in size and configuration. There is no intracranial mass, hemorrhage, extra-axial fluid collection, or midline shift. There is minimal small vessel disease in the centra semiovale bilaterally. Elsewhere gray-white compartments appear normal. No acute infarct is evident. Vascular: There is no hyperdense vessel. No appreciable arterial vascular calcification is evident. Skull: Bony calvarium appears intact. Sinuses/Orbits: Visualized paranasal sinuses are clear. No intraorbital lesions evident. Other: Mastoid air cells are clear. IMPRESSION: Minimal periventricular small vessel disease. No intracranial mass, hemorrhage, or extra-axial fluid collection. No acute appearing infarct. Study otherwise unremarkable.  Electronically  Signed   By: Lowella Grip III M.D.   On: 07/29/2016 12:47   Mr Brain Wo Contrast  Result Date: 07/29/2016 CLINICAL DATA:  Left-sided dysmetria in the arm and leg EXAM: MRI HEAD WITHOUT CONTRAST TECHNIQUE: Multiplanar, multiecho pulse sequences of the brain and surrounding structures were obtained without intravenous contrast. COMPARISON:  Head CT 07/29/2016 FINDINGS: Brain: There is a focus of diffusion restriction at the lateral aspect of the right thalamus, likely involving the posterior limb of the right internal capsule. This focus measures up to 9 mm. There is a punctate focus of diffusion restriction in a corresponding location on the left. There is an old right caudate lacunar infarct. No evidence of hemorrhage. No mass lesion or midline shift. No hydrocephalus or extra-axial fluid collection. The midline structures are normal. No age advanced or lobar predominant atrophy. Vascular: Major intracranial arterial and venous sinus flow voids are preserved. No evidence of chronic microhemorrhage or amyloid angiopathy. Skull and upper cervical spine: The visualized skull base, calvarium, upper cervical spine and extracranial soft tissues are normal. Sinuses/Orbits: No fluid levels or advanced mucosal thickening. No mastoid effusion. Normal orbits. IMPRESSION: 1. Small acute lacunar infarct of the posterior limb right internal capsule, in keeping with reported left-sided symptoms. No mass effect or hemorrhage. 2. Suspected punctate focus of acute ischemia of the posterior limb of the left internal capsule without mass effect or hemorrhage. Electronically Signed   By: Ulyses Jarred M.D.   On: 07/29/2016 20:15    Assessment: 71 y.o. male with acute onset of left hemiataxia and gait instability.  1. MRI reveals subacute infarction in the posterior limb of the right internal capsule. Also noted is a chronic cystic microinfarction in the white matter adjacent to the right frontal horn. 2.  Stroke Risk Factors - DM and age.   Plan: 1. HgbA1c 2. MRA  of the brain without contrast 3. PT consult, OT consult, Speech consult 4. Echocardiogram 5. Carotid dopplers 6. Continue Plavix and atorvastatin. 7. Risk factor modification 8. Telemetry monitoring 9. Frequent neuro checks 10. Glycemic control.  _0  signed: Dr. Kerney Elbe  07/29/2016, 8:54 PM

## 2016-07-30 ENCOUNTER — Inpatient Hospital Stay (HOSPITAL_COMMUNITY): Payer: Medicare HMO

## 2016-07-30 ENCOUNTER — Encounter (HOSPITAL_COMMUNITY): Payer: Self-pay | Admitting: General Practice

## 2016-07-30 DIAGNOSIS — E1122 Type 2 diabetes mellitus with diabetic chronic kidney disease: Secondary | ICD-10-CM | POA: Diagnosis present

## 2016-07-30 DIAGNOSIS — I639 Cerebral infarction, unspecified: Secondary | ICD-10-CM | POA: Diagnosis not present

## 2016-07-30 DIAGNOSIS — N183 Chronic kidney disease, stage 3 unspecified: Secondary | ICD-10-CM | POA: Diagnosis present

## 2016-07-30 DIAGNOSIS — E785 Hyperlipidemia, unspecified: Secondary | ICD-10-CM | POA: Diagnosis present

## 2016-07-30 DIAGNOSIS — I6300 Cerebral infarction due to thrombosis of unspecified precerebral artery: Secondary | ICD-10-CM | POA: Diagnosis not present

## 2016-07-30 DIAGNOSIS — E1049 Type 1 diabetes mellitus with other diabetic neurological complication: Secondary | ICD-10-CM | POA: Diagnosis not present

## 2016-07-30 DIAGNOSIS — G458 Other transient cerebral ischemic attacks and related syndromes: Secondary | ICD-10-CM | POA: Diagnosis not present

## 2016-07-30 DIAGNOSIS — I10 Essential (primary) hypertension: Secondary | ICD-10-CM | POA: Diagnosis not present

## 2016-07-30 LAB — BASIC METABOLIC PANEL
Anion gap: 8 (ref 5–15)
BUN: 15 mg/dL (ref 6–20)
CO2: 28 mmol/L (ref 22–32)
Calcium: 9.2 mg/dL (ref 8.9–10.3)
Chloride: 104 mmol/L (ref 101–111)
Creatinine, Ser: 1.26 mg/dL — ABNORMAL HIGH (ref 0.61–1.24)
GFR calc Af Amer: >60 mL/min (ref >60)
GFR calc non Af Amer: 55 mL/min — ABNORMAL LOW (ref >60)
Glucose, Bld: 119 mg/dL — ABNORMAL HIGH (ref 65–99)
Potassium: 4.2 mmol/L (ref 3.5–5.1)
Sodium: 140 mmol/L (ref 135–145)

## 2016-07-30 LAB — ECHOCARDIOGRAM COMPLETE
Height: 71 in
Weight: 3584 oz

## 2016-07-30 LAB — VAS US CAROTID
LEFT ECA DIAS: -4 cm/s
LEFT VERTEBRAL DIAS: -9 cm/s
Left CCA dist dias: -15 cm/s
Left CCA dist sys: -81 cm/s
Left CCA prox dias: 15 cm/s
Left CCA prox sys: 142 cm/s
Left ICA dist dias: -31 cm/s
Left ICA dist sys: -121 cm/s
Left ICA prox dias: -32 cm/s
Left ICA prox sys: -107 cm/s
RIGHT ECA DIAS: -10 cm/s
RIGHT VERTEBRAL DIAS: -9 cm/s
Right CCA prox dias: -11 cm/s
Right CCA prox sys: -105 cm/s
Right cca dist sys: -81 cm/s

## 2016-07-30 LAB — RAPID URINE DRUG SCREEN, HOSP PERFORMED
Amphetamines: NOT DETECTED
Barbiturates: NOT DETECTED
Benzodiazepines: NOT DETECTED
Cocaine: NOT DETECTED
Opiates: NOT DETECTED
Tetrahydrocannabinol: NOT DETECTED

## 2016-07-30 LAB — GLUCOSE, CAPILLARY
Glucose-Capillary: 95 mg/dL (ref 65–99)
Glucose-Capillary: 114 mg/dL — ABNORMAL HIGH (ref 65–99)
Glucose-Capillary: 212 mg/dL — ABNORMAL HIGH (ref 65–99)
Glucose-Capillary: 115 mg/dL — ABNORMAL HIGH (ref 65–99)

## 2016-07-30 LAB — TROPONIN I
Troponin I: <0.03 ng/mL (ref <0.03)
Troponin I: <0.03 ng/mL (ref <0.03)
Troponin I: <0.03 ng/mL (ref <0.03)

## 2016-07-30 LAB — TSH: TSH: 1.639 u[IU]/mL (ref 0.350–4.500)

## 2016-07-30 LAB — LIPID PANEL
Cholesterol: 164 mg/dL (ref 0–200)
HDL: 22 mg/dL — ABNORMAL LOW (ref >40)
LDL Cholesterol: 100 mg/dL — ABNORMAL HIGH (ref 0–99)
Total CHOL/HDL Ratio: 7.5 RATIO
Triglycerides: 211 mg/dL — ABNORMAL HIGH (ref <150)
VLDL: 42 mg/dL — ABNORMAL HIGH (ref 0–40)

## 2016-07-30 MED ORDER — GABAPENTIN 100 MG PO CAPS
200.0000 mg | ORAL_CAPSULE | Freq: Two times a day (BID) | ORAL | Status: DC
  Administered 2016-07-30 – 2016-07-31 (×4): 200 mg via ORAL
  Filled 2016-07-30 (×4): qty 2

## 2016-07-30 MED ORDER — ACETAMINOPHEN 650 MG RE SUPP: 650.0000 mg | RECTAL | Status: DC | PRN

## 2016-07-30 MED ORDER — DIPHENHYDRAMINE HCL 25 MG PO CAPS
25.0000 mg | ORAL_CAPSULE | Freq: Every evening | ORAL | Status: DC | PRN
  Administered 2016-07-30: 25 mg via ORAL
  Filled 2016-07-30: qty 1

## 2016-07-30 MED ORDER — FENOFIBRATE 160 MG PO TABS
160.0000 mg | ORAL_TABLET | Freq: Every evening | ORAL | Status: DC
  Administered 2016-07-30: 160 mg via ORAL
  Filled 2016-07-30: qty 1

## 2016-07-30 MED ORDER — MONTELUKAST SODIUM 10 MG PO TABS
10.0000 mg | ORAL_TABLET | Freq: Every evening | ORAL | Status: DC
  Administered 2016-07-30: 10 mg via ORAL
  Filled 2016-07-30: qty 1

## 2016-07-30 MED ORDER — DOCUSATE SODIUM 100 MG PO CAPS: 100.0000 mg | ORAL_CAPSULE | Freq: Two times a day (BID) | ORAL | Status: DC | PRN

## 2016-07-30 MED ORDER — ACETAMINOPHEN 160 MG/5ML PO SOLN: 650.0000 mg | ORAL | Status: DC | PRN

## 2016-07-30 MED ORDER — INSULIN PUMP
Freq: Three times a day (TID) | SUBCUTANEOUS | Status: DC
  Administered 2016-07-30 – 2016-07-31 (×4): via SUBCUTANEOUS
  Filled 2016-07-30: qty 1

## 2016-07-30 MED ORDER — CLOPIDOGREL BISULFATE 75 MG PO TABS
75.0000 mg | ORAL_TABLET | Freq: Every day | ORAL | Status: DC
  Administered 2016-07-30 – 2016-07-31 (×2): 75 mg via ORAL
  Filled 2016-07-30 (×2): qty 1

## 2016-07-30 MED ORDER — LORATADINE 10 MG PO TABS
10.0000 mg | ORAL_TABLET | Freq: Two times a day (BID) | ORAL | Status: DC
  Administered 2016-07-30 – 2016-07-31 (×3): 10 mg via ORAL
  Filled 2016-07-30 (×3): qty 1

## 2016-07-30 MED ORDER — STROKE: EARLY STAGES OF RECOVERY BOOK
Freq: Once | Status: DC
  Filled 2016-07-30: qty 1

## 2016-07-30 MED ORDER — ALLOPURINOL 100 MG PO TABS
300.0000 mg | ORAL_TABLET | Freq: Every morning | ORAL | Status: DC
  Administered 2016-07-30 – 2016-07-31 (×2): 300 mg via ORAL
  Filled 2016-07-30 (×2): qty 3

## 2016-07-30 MED ORDER — ACETAMINOPHEN 325 MG PO TABS: 650.0000 mg | ORAL_TABLET | ORAL | Status: DC | PRN

## 2016-07-30 MED ORDER — INSULIN PUMP: 110.0000 | Freq: Every day | SUBCUTANEOUS | Status: DC

## 2016-07-30 MED ORDER — GUAIFENESIN ER 600 MG PO TB12: 600.0000 mg | ORAL_TABLET | Freq: Two times a day (BID) | ORAL | Status: DC | PRN

## 2016-07-30 MED ORDER — ATORVASTATIN CALCIUM 40 MG PO TABS
40.0000 mg | ORAL_TABLET | Freq: Every day | ORAL | Status: DC
  Administered 2016-07-30: 40 mg via ORAL
  Filled 2016-07-30: qty 1

## 2016-07-30 MED ORDER — ENOXAPARIN SODIUM 40 MG/0.4ML ~~LOC~~ SOLN
40.0000 mg | Freq: Every day | SUBCUTANEOUS | Status: DC
  Administered 2016-07-30 – 2016-07-31 (×2): 40 mg via SUBCUTANEOUS
  Filled 2016-07-30 (×2): qty 0.4

## 2016-07-30 MED ORDER — PANTOPRAZOLE SODIUM 40 MG PO TBEC
40.0000 mg | DELAYED_RELEASE_TABLET | Freq: Two times a day (BID) | ORAL | Status: DC
  Administered 2016-07-30 – 2016-07-31 (×3): 40 mg via ORAL
  Filled 2016-07-30 (×3): qty 1

## 2016-07-30 MED ORDER — HYDROCORTISONE 1 % EX OINT
1.0000 "application " | TOPICAL_OINTMENT | Freq: Every day | CUTANEOUS | Status: DC
  Administered 2016-07-30 – 2016-07-31 (×2): 1 via TOPICAL
  Filled 2016-07-30: qty 28.35

## 2016-07-30 MED ORDER — SODIUM CHLORIDE 0.9 % IV SOLN
INTRAVENOUS | Status: DC
  Administered 2016-07-30: via INTRAVENOUS

## 2016-07-30 NOTE — Progress Notes (Signed)
PT Cancellation Note  Patient Details Name: Randall Horton MRN: [id number] DOB: [birth year]   Cancelled Treatment:    Reason Eval/Treat Not Completed: Patient at procedure or test/unavailable.  Will follow up another time.     [place], [place] 07/30/2016, 2:34 PM

## 2016-07-30 NOTE — Progress Notes (Signed)
VASCULAR LAB PRELIMINARY  PRELIMINARY  PRELIMINARY  PRELIMINARY  Carotid duplex completed.    Preliminary report:  1-39% ICA plaquing. Vertebral artery flow is antegrade.   Kynslee Baham, RVT 07/30/2016, 2:36 PM

## 2016-07-30 NOTE — Progress Notes (Signed)
STROKE TEAM PROGRESS NOTE   SUBJECTIVE (INTERVAL HISTORY)   OBJECTIVE Temp:  [97.7 F (36.5 C)-98.6 F (37 C)] 97.7 F (36.5 C) (02/08 1700) Pulse Rate:  [71-102] 80 (02/08 1700) Cardiac Rhythm: Normal sinus rhythm (02/08 0700) Resp:  [10-20] 20 (02/08 1700) BP: (127-158)/(66-88) 132/66 (02/08 1700) SpO2:  [95 %-100 %] 95 % (02/08 1700) Weight:  [101.6 kg (224 lb)] 101.6 kg (224 lb) (02/07 123456)  Basic Metabolic Panel:  Recent Labs Lab 07/29/16 1221 07/30/16 1136  NA 138 140  K 4.7 4.2  CL 104 104  CO2 25 28  GLUCOSE 194* 119*  BUN 18 15  CREATININE 1.37* 1.26*  CALCIUM 9.2 9.2   HgbA1c:  Lab Results  Component Value Date   HGBA1C 7.2 (H) 06/28/2014    PHYSICAL EXAM Pleasant elderly  male not in distress. . Afebrile. Head is nontraumatic. Neck is supple without bruit.    Cardiac exam no murmur or gallop. Lungs are clear to auscultation. Distal pulses are well felt. Neurological Exam :  Awake alert oriented x 3 normal speech and language. Mild left lower face asymmetry. Tongue midline. No drift. Mild diminished fine finger movements on left. Orbits right over left upper extremity. Mild left grip weak.. Normal sensation . Normal coordination.  ASSESSMENT/PLAN Randall Horton is a 71 y.o. male with history of DM on insulin pump, hypertriglyceridemia, GERD presenting with left sided incoordination and left face/arm/leg sensory numbness with paresthesias. He did not receive IV t-PA.   Stroke:  R PLIC infarct secondary to small vessel disease source  Resultant  left face and arm hemiparesis  CT head no acute stroke. Minimal periventricular small vessel disease  MRI  small R posterior limb internal capsule infarct. Suspected Punctate L posterior limb internal capsule infarct.  MRA  no proximal intracranial stenosis. Intracranial atherosclerotic disease elsewhere.  Carotid Doppler  1-39% ICA plaquing. Vertebral artery flow is antegrade  2D Echo  EF 55-60%. No source  of embolus  LDL 100  HgbA1c pending  Lovenox 40 mg sq daily for VTE prophylaxis  Diet heart healthy/carb modified Room service appropriate? Yes; Fluid consistency: Thin  No antithrombotic prior to admission, now on clopidogrel 75 mg daily  Therapy recommendations:  Pending  Disposition:  Pending  Hypertension  Stable Long-term BP goal normotensive  Hyperlipidemia  Home meds:  No statin  LDL goal < 70  Now on Lipitor 40 mg daily   Continue statin at discharge  Diabetes, type II  has insulin pump  HgbA1c goal < 7.0   Other Stroke Risk Factors  Advanced age  Former Cigarette smoker, quit 48 years ago   Uses smokeless tobacco, snuff  Urine drug screen negative  Obesity, Body mass index is 31.24 kg/m., recommend weight loss, diet and exercise as appropriate   Other Active Problems  . Chronic kidney disease, creatinine 1.37  Hospital day # 1 I have personally examined this patient, reviewed notes, independently viewed imaging studies, participated in medical decision making and plan of care.ROS completed by me personally and pertinent positives fully documented  I have made any additions or clarifications directly to the above note. Agree with note above.  He presented with left sided incoordination and left face/arm/leg sensory numbness with paresthesias from small right posterior limb internal capsule infarct from small vessel disease. Recommend Plavix for stroke prevention and aggressive risk factor modification. Quit tobacco snuff. Greater than 50% time during this 35 minute visit was spent on counseling and coordination of care and  explanation of diagnosis, evaluation, prevention and treatment plan and answering questions.  Antony Contras, MD Medical Director [place] Pager: [phone] 07/30/2016 11:02 PM    To contact Stroke Continuity provider, please refer to [email or web address] After hours, contact General Neurology All

## 2016-07-30 NOTE — Progress Notes (Signed)
PT Cancellation Note  Patient Details Name: Randall Horton MRN: [id number] DOB: 04-13-1946   Cancelled Treatment:    Reason Eval/Treat Not Completed: Patient not medically ready.  Pt currently on bedrest.  Please advance activity order once appropriate for PT and mobility.     [place], [place] 07/30/2016, 9:43 AM

## 2016-07-30 NOTE — Progress Notes (Signed)
[facility] PROGRESS NOTE  Randall Horton [id number] DOB: April 21, 1946 DOA: 07/29/2016   PCP: Sheela Stack, MD     Assessment/Plan: Principal Problem:   CVA (cerebral vascular accident) [facility]) Active Problems:   Type 1 diabetes mellitus ([place])   Essential hypertension   Hyperlipidemia   CKD stage 3 secondary to diabetes [facility])   71 y.o. male who presented with left sided incoordination and left face/arm/leg sensory numbness with paresthesias. The symptoms were sudden in onset and began on Wednesday, first noticed upon awakening from a nap. He had no focal weakness. He states that when he picked up a coffee pot with his left hand it suddenly went up much higher than normal as though his left arm was out of control. He had an unsteady gait when he was seen in triage. He used to take Plavix but stopped it for a reason he cannot remember.MRI was obtained, revealing subacute infarction in the posterior limb of the right internal capsule  Assessment and plan  CVA -Symptoms consistent with CVA  -MRI shows small lacunar acute infarct of the posterior limb of the internal capsule on the right, c/w left-sided symptoms; there is also a suspected punctate focus of acute ischemia of the posterior limb of the left internal capsule Continue CVA evaluation -Telemetry monitoring-normal sinus -MRA -Carotid dopplers pending -Echo pending -Risk stratification with FLP, A1c; triglycerides 211, LDL 100 -Unable to take ASA due to GI issues in the past so will try Plavix  Daily with BID Protonix for GI prophylaxis -PT/OT/ST/Nutrition Consults  Started on Plavix  HTN -Allow permissive HTN -Treat BP only if >220/120, and then with goal of 15% reduction -Does not appear to be taking BP meds on a chronic basis  HLD Lipid panel as above Started on Lipitor 40 mg qhs  DM -Glucose 194 -Uses insulin pump -Reports that he prefers to have glucose in 140 range since <100 makes him feel  profoundly hypoglycemic -Last A1c was 7.2 in 1/16; this is suboptimal and may increase the risk for future strokes -Diabetic education requested  CKD -Creatinine 1.37, prior 1.52 on 06/29/14    DVT prophylaxsis   Code Status:  Full code     Family Communication: Discussed in detail with the patient, all imaging results, lab results explained to the patient   Disposition Plan: Anticipate discharge in 1-2 days, after completion of stroke workup     Consultants:  Neurology  Procedures:  None  Antibiotics: Anti-infectives    None         HPI/Subjective:  Left hand and foot numbness improving  Objective: Vitals:   07/30/16 0155 07/30/16 0300 07/30/16 0604 07/30/16 0808  BP: 128/66 133/76 136/70 (!) 146/75  Pulse: 72 80 79 71  Resp: 20 20 20 16   Temp: 98.2 F (36.8 C) 97.8 F (36.6 C) 98.2 F (36.8 C)   TempSrc: Oral Oral Oral   SpO2: 95% 98% 100% 95%  Weight:      Height:       No intake or output data in the 24 hours ending 07/30/16 0816  Exam:  Examination:  General exam: Appears calm and comfortable  Respiratory system: Clear to auscultation. Respiratory effort normal. Cardiovascular system: S1 & S2 heard, RRR. No JVD, murmurs, rubs, gallops or clicks. No pedal edema. Gastrointestinal system: Abdomen is nondistended, soft and nontender. No organomegaly or masses felt. Normal bowel sounds heard. Central nervous system: Alert and oriented. No focal neurological deficits. Extremities: Symmetric 5  x 5 power. Skin: No rashes, lesions or ulcers Psychiatry: Judgement and insight appear normal. Mood & affect appropriate.     Data Reviewed: I have personally reviewed following labs and imaging studies  Micro Results No results found for this or any previous visit (from the past 240 hour(s)).  Radiology Reports Ct Head Wo Contrast  Result Date: 07/29/2016 CLINICAL DATA:  Acute onset left-sided numbness with headache EXAM: CT HEAD WITHOUT CONTRAST  TECHNIQUE: Contiguous axial images were obtained from the base of the skull through the vertex without intravenous contrast. COMPARISON:  None. FINDINGS: Brain: The ventricles are normal in size and configuration. There is no intracranial mass, hemorrhage, extra-axial fluid collection, or midline shift. There is minimal small vessel disease in the centra semiovale bilaterally. Elsewhere gray-white compartments appear normal. No acute infarct is evident. Vascular: There is no hyperdense vessel. No appreciable arterial vascular calcification is evident. Skull: Bony calvarium appears intact. Sinuses/Orbits: Visualized paranasal sinuses are clear. No intraorbital lesions evident. Other: Mastoid air cells are clear. IMPRESSION: Minimal periventricular small vessel disease. No intracranial mass, hemorrhage, or extra-axial fluid collection. No acute appearing infarct. Study otherwise unremarkable. Electronically Signed   By: Lowella Grip III M.D.   On: 07/29/2016 12:47   Mr Brain Wo Contrast  Result Date: 07/29/2016 CLINICAL DATA:  Left-sided dysmetria in the arm and leg EXAM: MRI HEAD WITHOUT CONTRAST TECHNIQUE: Multiplanar, multiecho pulse sequences of the brain and surrounding structures were obtained without intravenous contrast. COMPARISON:  Head CT 07/29/2016 FINDINGS: Brain: There is a focus of diffusion restriction at the lateral aspect of the right thalamus, likely involving the posterior limb of the right internal capsule. This focus measures up to 9 mm. There is a punctate focus of diffusion restriction in a corresponding location on the left. There is an old right caudate lacunar infarct. No evidence of hemorrhage. No mass lesion or midline shift. No hydrocephalus or extra-axial fluid collection. The midline structures are normal. No age advanced or lobar predominant atrophy. Vascular: Major intracranial arterial and venous sinus flow voids are preserved. No evidence of chronic microhemorrhage or  amyloid angiopathy. Skull and upper cervical spine: The visualized skull base, calvarium, upper cervical spine and extracranial soft tissues are normal. Sinuses/Orbits: No fluid levels or advanced mucosal thickening. No mastoid effusion. Normal orbits. IMPRESSION: 1. Small acute lacunar infarct of the posterior limb right internal capsule, in keeping with reported left-sided symptoms. No mass effect or hemorrhage. 2. Suspected punctate focus of acute ischemia of the posterior limb of the left internal capsule without mass effect or hemorrhage. Electronically Signed   By: Ulyses Jarred M.D.   On: 07/29/2016 20:15     CBC  Recent Labs Lab 07/29/16 1221  WBC 8.5  HGB 14.8  HCT 43.8  PLT 239  MCV 91.6  MCH 31.0  MCHC 33.8  RDW 13.6  LYMPHSABS 1.5  MONOABS 0.8  EOSABS 0.4  BASOSABS 0.1    Chemistries   Recent Labs Lab 07/29/16 1221  NA 138  K 4.7  CL 104  CO2 25  GLUCOSE 194*  BUN 18  CREATININE 1.37*  CALCIUM 9.2  AST 33  ALT 40  ALKPHOS 69  BILITOT 0.4   ------------------------------------------------------------------------------------------------------------------ estimated creatinine clearance is 59.1 mL/min (by C-G formula based on SCr of 1.37 mg/dL (H)). ------------------------------------------------------------------------------------------------------------------ No results for input(s): HGBA1C in the last 72 hours. ------------------------------------------------------------------------------------------------------------------  Recent Labs  07/30/16 0238  CHOL 164  HDL 22*  LDLCALC 100*  TRIG 211*  CHOLHDL 7.5   ------------------------------------------------------------------------------------------------------------------  Recent Labs  07/30/16 0238  TSH 1.639   ------------------------------------------------------------------------------------------------------------------ No results for input(s): VITAMINB12, FOLATE, FERRITIN, TIBC, IRON,  RETICCTPCT in the last 72 hours.  Coagulation profile  Recent Labs Lab 07/29/16 1221  INR 1.02    No results for input(s): DDIMER in the last 72 hours.  Cardiac Enzymes  Recent Labs Lab 07/30/16 0238 07/30/16 0557  TROPONINI <0.03 <0.03   ------------------------------------------------------------------------------------------------------------------ Invalid input(s): POCBNP   CBG:  Recent Labs Lab 07/29/16 1514 07/29/16 2350 07/30/16 0626  GLUCAP 147* 115* 95       Studies: Ct Head Wo Contrast  Result Date: 07/29/2016 CLINICAL DATA:  Acute onset left-sided numbness with headache EXAM: CT HEAD WITHOUT CONTRAST TECHNIQUE: Contiguous axial images were obtained from the base of the skull through the vertex without intravenous contrast. COMPARISON:  None. FINDINGS: Brain: The ventricles are normal in size and configuration. There is no intracranial mass, hemorrhage, extra-axial fluid collection, or midline shift. There is minimal small vessel disease in the centra semiovale bilaterally. Elsewhere gray-white compartments appear normal. No acute infarct is evident. Vascular: There is no hyperdense vessel. No appreciable arterial vascular calcification is evident. Skull: Bony calvarium appears intact. Sinuses/Orbits: Visualized paranasal sinuses are clear. No intraorbital lesions evident. Other: Mastoid air cells are clear. IMPRESSION: Minimal periventricular small vessel disease. No intracranial mass, hemorrhage, or extra-axial fluid collection. No acute appearing infarct. Study otherwise unremarkable. Electronically Signed   By: Lowella Grip III M.D.   On: 07/29/2016 12:47   Mr Brain Wo Contrast  Result Date: 07/29/2016 CLINICAL DATA:  Left-sided dysmetria in the arm and leg EXAM: MRI HEAD WITHOUT CONTRAST TECHNIQUE: Multiplanar, multiecho pulse sequences of the brain and surrounding structures were obtained without intravenous contrast. COMPARISON:  Head CT 07/29/2016  FINDINGS: Brain: There is a focus of diffusion restriction at the lateral aspect of the right thalamus, likely involving the posterior limb of the right internal capsule. This focus measures up to 9 mm. There is a punctate focus of diffusion restriction in a corresponding location on the left. There is an old right caudate lacunar infarct. No evidence of hemorrhage. No mass lesion or midline shift. No hydrocephalus or extra-axial fluid collection. The midline structures are normal. No age advanced or lobar predominant atrophy. Vascular: Major intracranial arterial and venous sinus flow voids are preserved. No evidence of chronic microhemorrhage or amyloid angiopathy. Skull and upper cervical spine: The visualized skull base, calvarium, upper cervical spine and extracranial soft tissues are normal. Sinuses/Orbits: No fluid levels or advanced mucosal thickening. No mastoid effusion. Normal orbits. IMPRESSION: 1. Small acute lacunar infarct of the posterior limb right internal capsule, in keeping with reported left-sided symptoms. No mass effect or hemorrhage. 2. Suspected punctate focus of acute ischemia of the posterior limb of the left internal capsule without mass effect or hemorrhage. Electronically Signed   By: Ulyses Jarred M.D.   On: 07/29/2016 20:15      Lab Results  Component Value Date   HGBA1C 7.2 (H) 06/28/2014   Lab Results  Component Value Date   LDLCALC 100 (H) 07/30/2016   CREATININE 1.37 (H) 07/29/2016       Scheduled Meds: .  stroke: mapping our early stages of recovery book   Does not apply Once  . allopurinol  300 mg Oral q morning - 10a  . atorvastatin  40 mg Oral q1800  . clopidogrel  75 mg Oral Daily  . enoxaparin (LOVENOX) injection  40 mg Subcutaneous Daily  . fenofibrate  160 mg  Oral QPM  . gabapentin  200 mg Oral BID  . hydrocortisone  1 application Topical Daily  . insulin pump   Subcutaneous TID AC, HS, 0200  . loratadine  10 mg Oral BID  . montelukast  10 mg  Oral QPM  . pantoprazole  40 mg Oral BID   Continuous Infusions: . sodium chloride 50 mL/hr at 07/30/16 0023     LOS: 1 day    Time spent: >30 MINS    [facility]  [facility] Pager [phone]. If 7PM-7AM, please contact night-coverage at [email or web address], password [facility] 07/30/2016, 8:16 AM  LOS: 1 day

## 2016-07-30 NOTE — Progress Notes (Signed)
Nutrition Education Note  RD consulted for CVA.   Lipid Panel     Component Value Date/Time   CHOL 164 07/30/2016 0238   TRIG 211 (H) 07/30/2016 0238   HDL 22 (L) 07/30/2016 0238   CHOLHDL 7.5 07/30/2016 0238   VLDL 42 (H) 07/30/2016 0238   LDLCALC 100 (H) 07/30/2016 0238    Pt states that he has Type 1 Diabetes. He has an insulin pump take insulin at every meal to cover his blood sugar and intake of carbohydrates. Both pt and his wife at bedside report that pt does well with carbohydrate intake and keeping blood glucose normal. He has a good appetite and has been eating normally. Pt states that he could use some information on a heart healthy diet.  RD provided "Heart Healthy Nutrition Therapy" handout from the Academy of Nutrition and Dietetics. Reviewed patient's dietary recall.  Encouraged fresh fruits and vegetables as well as whole grain sources of carbohydrates to maximize fiber intacake. Discussed ways to decreased intake and saturated fat and discussed alternative unsaturated fat options. Encouraged intake of lean protein sources and nuts. Teach back method used.  Expect good compliance. Wife reports that they can both cut back on their intake of fried foods. Pt states that he hates fish and could benefit from taking a daily fish oil supplement. He eats nuts most days and will continue to do so.   Body mass index is 31.24 kg/m. Pt meets criteria for Obesity based on current BMI.  Current diet order is Heart Healthy/Carb Modified, patient is consuming approximately 100% of meals at this time. Labs and medications reviewed. No further nutrition interventions warranted at this time. RD contact information provided. If additional nutrition issues arise, please re-consult RD.  Scarlette Ar RD, CSP, LDN Inpatient Clinical Dietitian Pager: [phone] After Hours Pager: [phone]

## 2016-07-30 NOTE — Progress Notes (Addendum)
Inpatient Diabetes Program Recommendations  AACE/ADA: New Consensus Statement on Inpatient Glycemic Control (2015)  Target Ranges:  Prepandial:   less than 140 mg/dL      Peak postprandial:   less than 180 mg/dL (1-2 hours)      Critically ill patients:  140 - 180 mg/dL   Lab Results  Component Value Date   GLUCAP 114 (H) 07/30/2016   HGBA1C 7.2 (H) 06/28/2014    Review of Glycemic Control  Diabetes history: DM2 Outpatient Diabetes medications: insulin pump Current orders for Inpatient glycemic control: insulin pump HgbA1C pending  Inpatient Diabetes Program Recommendations:    Continue with insulin pump See settings below. Called RN regarding pt contract form and flowsheet for bolusing insulin  Pt is 71 year old IDDM with insulin pump s/p TURP. Endo is Dr. Forde Dandy. Has had pump for 10+ years. Last HgbA1C <7.0%.  Insulin Pump settings: Basal -  12 - 0200 - 2.2 units 0200 - 0700 - 2.9 units 0700 - 0900 - 2.2 units 0900 - 1700 - 2.6 units 1700 - 2100 - 2.9 units 2100 - 2400 - 2.6 units Bolus -  Goal - 100-120 CHO ratio - 1:6 CF - 25  Will follow. Thank you. Lorenda Peck, RD, LDN, CDE Inpatient Diabetes Coordinator [phone]  Addendum: Spoke with pt and wife regarding his diabetes control. Sees Dr. Forde Dandy on regular basis. Follows up with CDE in MD's office. Pt states he signed contract for insulin pump.

## 2016-07-30 NOTE — Care Management Note (Signed)
Case Management Note  Patient Details  Name: Randall Horton MRN: [id number] Date of Birth: 05/12/1946  Subjective/Objective:                  Patient was admitted with CVA. Lives at home with spouse. CM will follow for discharge needs pending PT/OT evals and physician orders.  Action/Plan:   Expected Discharge Date:                  Expected Discharge Plan:     In-House Referral:     Discharge planning Services     Post Acute Care Choice:    Choice offered to:     DME Arranged:    DME Agency:     HH Arranged:    HH Agency:     Status of Service:     If discussed at [employer] of Stay Meetings, dates discussed:    Additional Comments:  Rolm Baptise, RN 07/30/2016, 10:01 AM

## 2016-07-30 NOTE — Progress Notes (Signed)
Echocardiogram 2D Echocardiogram has been performed.  Randall Horton 07/30/2016, 2:59 PM

## 2016-07-31 DIAGNOSIS — I10 Essential (primary) hypertension: Secondary | ICD-10-CM | POA: Diagnosis not present

## 2016-07-31 DIAGNOSIS — I6300 Cerebral infarction due to thrombosis of unspecified precerebral artery: Secondary | ICD-10-CM | POA: Diagnosis not present

## 2016-07-31 DIAGNOSIS — N183 Chronic kidney disease, stage 3 (moderate): Secondary | ICD-10-CM | POA: Diagnosis not present

## 2016-07-31 DIAGNOSIS — E1122 Type 2 diabetes mellitus with diabetic chronic kidney disease: Secondary | ICD-10-CM | POA: Diagnosis not present

## 2016-07-31 DIAGNOSIS — E1049 Type 1 diabetes mellitus with other diabetic neurological complication: Secondary | ICD-10-CM | POA: Diagnosis not present

## 2016-07-31 DIAGNOSIS — I639 Cerebral infarction, unspecified: Secondary | ICD-10-CM | POA: Diagnosis not present

## 2016-07-31 LAB — GLUCOSE, CAPILLARY
Glucose-Capillary: 92 mg/dL (ref 65–99)
Glucose-Capillary: 154 mg/dL — ABNORMAL HIGH (ref 65–99)

## 2016-07-31 MED ORDER — ATORVASTATIN CALCIUM 40 MG PO TABS: 40.0000 mg | ORAL_TABLET | Freq: Every day | ORAL | 2 refills | Status: AC

## 2016-07-31 MED ORDER — CLOPIDOGREL BISULFATE 75 MG PO TABS: 75.0000 mg | ORAL_TABLET | Freq: Every day | ORAL | 2 refills | Status: AC

## 2016-07-31 NOTE — Progress Notes (Signed)
RN discussed discharge instructions with patient including f/u appt with pcp in 1-2 weeks, states he will make that appt, neurology f/u, understands they will call him to make appt. Understands and vocalized understanding of lipitor and plavix indication, route, frequency, next dosage due. Understands s/sx of stroke. Discharge instructions given to patient, states he will pick up RX tonight to take lipitor tonight.  IV removed, tele removed, neuro assessment unchanged.

## 2016-07-31 NOTE — Evaluation (Addendum)
Physical Therapy Evaluation Patient Details Name: Randall Horton MRN: [id number] DOB: 1945/08/21 Today's Date: 07/31/2016   History of Present Illness   71 y.o. male with history of DM on insulin pump, hypertriglyceridemia, GERD presenting with left sided incoordination and left face/arm/leg sensory numbness with paresthesias. Dx of R internal capsule CVA.   Clinical Impression  Pt is independent with mobility, he ambulated 450' without an assistive device with no loss of balance. Pt reports slightly decreased sensation to light touch in L foot, he notes this has improved since admission. LLE strength is 5/5.  From PT standpoint he is ready to DC home. No further PT indicated, will sign off.     Follow Up Recommendations No PT follow up    Equipment Recommendations  None recommended by PT    Recommendations for Other Services       Precautions / Restrictions Precautions Precautions: None Precaution Comments: pt denies h/o fall in past 1 year Restrictions Weight Bearing Restrictions: No      Mobility  Bed Mobility               General bed mobility comments: up in recliner  Transfers Overall transfer level: Independent                  Ambulation/Gait Ambulation/Gait assistance: Independent Ambulation Distance (Feet): 450 Feet Assistive device: None Gait Pattern/deviations: WFL(Within Functional Limits)   Gait velocity interpretation: at or above normal speed for age/gender General Gait Details: R foot externally rotated at baseline, no LOB with head turns  Stairs            Wheelchair Mobility    Modified Rankin (Stroke Patients Only)       Balance Overall balance assessment: Independent (steady with reaching to floor, turning 360*, standing with feet together)                                           Pertinent Vitals/Pain Pain Assessment: No/denies pain    Home Living Family/patient expects to be discharged to::  Private residence Living Arrangements: Spouse/significant other Available Help at Discharge: Family;Available 24 hours/day           Home Equipment: Walker - 2 wheels      Prior Function Level of Independence: Independent               Hand Dominance        Extremity/Trunk Assessment   Upper Extremity Assessment Upper Extremity Assessment: Defer to OT evaluation    Lower Extremity Assessment Lower Extremity Assessment: Overall WFL for tasks assessed; slightly decreased sensation to light touch L foot, pt reports this has improved since admission.     Cervical / Trunk Assessment Cervical / Trunk Assessment: Normal  Communication   Communication: No difficulties  Cognition Arousal/Alertness: Awake/alert Behavior During Therapy: WFL for tasks assessed/performed Overall Cognitive Status: Within Functional Limits for tasks assessed                      General Comments      Exercises     Assessment/Plan    PT Assessment Patent does not need any further PT services  PT Problem List            PT Treatment Interventions      PT Goals (Current goals can be found in the Care Plan section)  Acute Rehab PT Goals Patient Stated Goal: return to woodworking PT Goal Formulation: All assessment and education complete, DC therapy    Frequency     Barriers to discharge        Co-evaluation               End of Session Equipment Utilized During Treatment: Gait belt Activity Tolerance: Patient tolerated treatment well;No increased pain Patient left: in chair;with call bell/phone within reach;with family/visitor present Nurse Communication: Mobility status         Time: [id number] PT Time Calculation (min) (ACUTE ONLY): 11 min   Charges:   PT Evaluation $PT Eval Low Complexity: 1 Procedure     PT G CodesBlondell Reveal Kistler 07/31/2016, 12:11 PM [phone]

## 2016-07-31 NOTE — Evaluation (Signed)
Occupational Therapy Evaluation Patient Details Name: CEDRICK AUBRY MRN: [id number] DOB: 1945-08-21 Today's Date: 07/31/2016    History of Present Illness  71 y.o. male with history of DM on insulin pump, hypertriglyceridemia, GERD presenting with left sided incoordination and left face/arm/leg sensory numbness with paresthesias. Dx of R internal capsule CVA.    Clinical Impression   Patient evaluated by Occupational Therapy with no further acute OT needs identified. All education has been completed and the patient has no further questions. See below for any follow-up Occupational Therapy or equipment needs. OT to sign off. Thank you for referral.  Educated on signs symptoms of stroke, educated wife on Be Fast, educated on use of IPad in room for L hand movements using apps.     Follow Up Recommendations  No OT follow up    Equipment Recommendations  None recommended by OT    Recommendations for Other Services       Precautions / Restrictions Precautions Precautions: None Precaution Comments: pt denies h/o fall in past 1 year Restrictions Weight Bearing Restrictions: No      Mobility Bed Mobility               General bed mobility comments: in chair on arrival   Transfers Overall transfer level: Independent                    Balance Overall balance assessment: Independent (steady with reaching to floor, turning 360*, standing with feet together)                                          ADL Overall ADL's : Independent                                             Vision     Perception     Praxis      Pertinent Vitals/Pain Pain Assessment: No/denies pain     Hand Dominance     Extremity/Trunk Assessment Upper Extremity Assessment Upper Extremity Assessment: Overall WFL for tasks assessed   Lower Extremity Assessment Lower Extremity Assessment: Overall WFL for tasks assessed   Cervical / Trunk  Assessment Cervical / Trunk Assessment: Normal   Communication Communication Communication: No difficulties   Cognition Arousal/Alertness: Awake/alert Behavior During Therapy: WFL for tasks assessed/performed Overall Cognitive Status: Within Functional Limits for tasks assessed                     General Comments       Exercises       Shoulder Instructions      Home Living Family/patient expects to be discharged to:: Private residence Living Arrangements: Spouse/significant other Available Help at Discharge: Family;Available 24 hours/day Type of Home: House Home Access: Stairs to enter [employer] of Steps: 3 Entrance Stairs-Rails: Right Home Layout: One level     Bathroom Shower/Tub: Occupational psychologist: Standard     Home Equipment: Environmental consultant - 2 wheels          Prior Functioning/Environment Level of Independence: Independent                 OT Problem List:     OT Treatment/Interventions:      OT Goals(Current goals  can be found in the care plan section) Acute Rehab OT Goals Patient Stated Goal: return to woodworking  OT Frequency:     Barriers to D/C:            Co-evaluation              End of Session    Activity Tolerance: Patient tolerated treatment well Patient left: in chair;with call bell/phone within reach;with family/visitor present   Time: [id number] OT Time Calculation (min): 12 min Charges:  OT General Charges $OT Visit: 1 Procedure OT Evaluation $OT Eval Low Complexity: 1 Procedure G-Codes:    Peri Maris August 17, 2016, 2:28 PM  Jeri Modena   OTR/L Pager: [phone] Office: [phone] .

## 2016-07-31 NOTE — Discharge Summary (Signed)
Physician Discharge Summary  THADDUS MCDOWELL MRN: [id number] DOB/AGE: [birth year] 71 y.o.  PCP: Sheela Stack, MD   Admit date: 07/29/2016 Discharge date: 07/31/2016  Discharge Diagnoses:    Principal Problem:   Cerebrovascular accident (CVA) [facility]) Active Problems:   Type 1 diabetes mellitus ([place])   Essential hypertension   Hyperlipidemia   CKD stage 3 secondary to diabetes ([place])    Follow-up recommendations Follow-up with PCP in 3-5 days , including all  additional recommended appointments as below Follow-up CBC, CMP in 3-5 days       Current Discharge Medication List    START taking these medications   Details  atorvastatin (LIPITOR) 40 MG tablet Take 1 tablet (40 mg total) by mouth daily at 6 PM. Qty: 30 tablet, Refills: 2    clopidogrel (PLAVIX) 75 MG tablet Take 1 tablet (75 mg total) by mouth daily. Qty: 30 tablet, Refills: 2      CONTINUE these medications which have NOT CHANGED   Details  acetaminophen (TYLENOL) 500 MG tablet Take 1,000 mg by mouth every 6 (six) hours as needed for moderate pain or headache.    allopurinol (ZYLOPRIM) 300 MG tablet Take 300 mg by mouth every morning.    clobetasol cream (TEMOVATE) 1.82 % Apply 1 application topically daily as needed (rash on chin.).    diphenhydrAMINE (BENADRYL) 25 MG tablet Take 25 mg by mouth at bedtime as needed for itching (around pump site.).    docusate sodium (COLACE) 100 MG capsule Take 100 mg by mouth 2 (two) times daily as needed for mild constipation.    fenofibrate 160 MG tablet Take 160 mg by mouth every evening.    gabapentin (NEURONTIN) 100 MG capsule Take 200 mg by mouth 2 (two) times daily.    guaiFENesin (MUCINEX) 600 MG 12 hr tablet Take 600-1,200 mg by mouth 2 (two) times daily as needed for cough or to loosen phlegm.    hydrocortisone 1 % ointment Apply 1 application topically daily. APPLY TO EARS    Insulin Human (INSULIN PUMP) SOLN Inject 110-120 each into the skin daily.  Novolog.    loratadine (CLARITIN) 10 MG tablet Take 10 mg by mouth 2 (two) times daily.     montelukast (SINGULAIR) 10 MG tablet Take 10 mg by mouth every evening.    omeprazole (PRILOSEC OTC) 20 MG tablet Take 20 mg by mouth every morning.    Pramoxine-Benzyl Alcohol (ITCH-X EX) Apply 1 application topically daily as needed. For itching    Turmeric 500 MG CAPS Take 1 capsule by mouth 2 (two) times daily.       STOP taking these medications     ibuprofen (ADVIL,MOTRIN) 200 MG tablet      meloxicam (MOBIC) 15 MG tablet          Discharge Condition: Stable   Discharge Instructions Get Medicines reviewed and adjusted: Please take all your medications with you for your next visit with your Primary MD  Please request your Primary MD to go over all hospital tests and procedure/radiological results at the follow up, please ask your Primary MD to get all Hospital records sent to his/her office.  If you experience worsening of your admission symptoms, develop shortness of breath, life threatening emergency, suicidal or homicidal thoughts you must seek medical attention immediately by calling 911 or calling your MD immediately if symptoms less severe.  You must read complete instructions/literature along with all the possible adverse reactions/side effects for all the Medicines you take and that  have been prescribed to you. Take any new Medicines after you have completely understood and accpet all the possible adverse reactions/side effects.   Do not drive when taking Pain medications.   Do not take more than prescribed Pain, Sleep and Anxiety Medications  Special Instructions: If you have smoked or chewed Tobacco in the last 2 yrs please stop smoking, stop any regular Alcohol and or any Recreational drug use.  Wear Seat belts while driving.  Please note  You were cared for by a hospitalist during your hospital stay. Once you are discharged, your primary care physician will  handle any further medical issues. Please note that NO REFILLS for any discharge medications will be authorized once you are discharged, as it is imperative that you return to your primary care physician (or establish a relationship with a primary care physician if you do not have one) for your aftercare needs so that they can reassess your need for medications and monitor your lab values.     Allergies  Allergen Reactions  . Aspirin Other (See Comments)    Severe stomach cramps + constipation.   . Niacin And Related Other (See Comments)    "flushing"  . Penicillins Hives, Itching and Swelling    Has patient had a PCN reaction causing immediate rash, facial/tongue/throat swelling, SOB or lightheadedness with hypotension: YES Has patient had a PCN reaction causing severe rash involving mucus membranes or skin necrosis: NO Has patient had a PCN reaction that required hospitalization NO Has patient had a PCN reaction occurring within the last 10 years: NO If all of the above answers are "NO", then may proceed with Cephalosporin use.      Disposition: 01-Home or Self Care   Consults: Neurology     Significant Diagnostic Studies:  Ct Head Wo Contrast  Result Date: 07/29/2016 CLINICAL DATA:  Acute onset left-sided numbness with headache EXAM: CT HEAD WITHOUT CONTRAST TECHNIQUE: Contiguous axial images were obtained from the base of the skull through the vertex without intravenous contrast. COMPARISON:  None. FINDINGS: Brain: The ventricles are normal in size and configuration. There is no intracranial mass, hemorrhage, extra-axial fluid collection, or midline shift. There is minimal small vessel disease in the centra semiovale bilaterally. Elsewhere gray-white compartments appear normal. No acute infarct is evident. Vascular: There is no hyperdense vessel. No appreciable arterial vascular calcification is evident. Skull: Bony calvarium appears intact. Sinuses/Orbits: Visualized paranasal  sinuses are clear. No intraorbital lesions evident. Other: Mastoid air cells are clear. IMPRESSION: Minimal periventricular small vessel disease. No intracranial mass, hemorrhage, or extra-axial fluid collection. No acute appearing infarct. Study otherwise unremarkable. Electronically Signed   By: Lowella Grip III M.D.   On: 07/29/2016 12:47   Mr Brain Wo Contrast  Result Date: 07/29/2016 CLINICAL DATA:  Left-sided dysmetria in the arm and leg EXAM: MRI HEAD WITHOUT CONTRAST TECHNIQUE: Multiplanar, multiecho pulse sequences of the brain and surrounding structures were obtained without intravenous contrast. COMPARISON:  Head CT 07/29/2016 FINDINGS: Brain: There is a focus of diffusion restriction at the lateral aspect of the right thalamus, likely involving the posterior limb of the right internal capsule. This focus measures up to 9 mm. There is a punctate focus of diffusion restriction in a corresponding location on the left. There is an old right caudate lacunar infarct. No evidence of hemorrhage. No mass lesion or midline shift. No hydrocephalus or extra-axial fluid collection. The midline structures are normal. No age advanced or lobar predominant atrophy. Vascular: Major intracranial arterial and venous  sinus flow voids are preserved. No evidence of chronic microhemorrhage or amyloid angiopathy. Skull and upper cervical spine: The visualized skull base, calvarium, upper cervical spine and extracranial soft tissues are normal. Sinuses/Orbits: No fluid levels or advanced mucosal thickening. No mastoid effusion. Normal orbits. IMPRESSION: 1. Small acute lacunar infarct of the posterior limb right internal capsule, in keeping with reported left-sided symptoms. No mass effect or hemorrhage. 2. Suspected punctate focus of acute ischemia of the posterior limb of the left internal capsule without mass effect or hemorrhage. Electronically Signed   By: Ulyses Jarred M.D.   On: 07/29/2016 20:15   Mr Jodene Nam  Head/brain KZ Cm  Result Date: 07/30/2016 CLINICAL DATA:  Continued surveillance cerebrovascular disease. Acute RIGHT thalamus lacunar infarct. EXAM: MRA HEAD WITHOUT CONTRAST TECHNIQUE: Angiographic images of the Circle of Willis were obtained using MRA technique without intravenous contrast. COMPARISON:  MRI brain 07/29/2016. FINDINGS: The internal carotid arteries are widely patent. The basilar artery is widely patent. Vertebrals are codominant. There is no proximal stenosis of the anterior or middle cerebral arteries in their A1 or M1 segments. Balanced contribution to the RIGHT PCA from a prominent posterior communicating artery. No proximal P1 stenosis. Severely diseased anterior branch LEFT MCA M2. Mildly irregular distal MCA and PCA branches bilaterally. Superior cerebellar arteries are patent. Poorly visualized AICA branches. Both PICA branches are patent. No intracranial saccular aneurysm. IMPRESSION: No proximal intracranial stenosis. Intracranial atherosclerotic disease elsewhere as above. No posterior circulation abnormality is seen which might correlate with the observed pattern of infarction. Electronically Signed   By: Staci Righter M.D.   On: 07/30/2016 11:12    2-D echo  LV EF: 55% -   60%  ------------------------------------------------------------------- Indications:      TIA 435.9.  ------------------------------------------------------------------- History:   PMH:  Tachycardia.  Risk factors:  Diabetes mellitus.   ------------------------------------------------------------------- Study Conclusions  - Left ventricle: The cavity size was normal. Systolic function was   normal. The estimated ejection fraction was in the range of 55%   to 60%. Wall motion was normal; there were no regional wall   motion abnormalities. - Mitral valve: There was mild regurgitation.   Filed Weights   07/29/16 2330  Weight: 101.6 kg (224 lb)     Microbiology: No results found for  this or any previous visit (from the past 240 hour(s)).     Blood Culture No results found for: SDES, SPECREQUEST, CULT, REPTSTATUS    Labs: Results for orders placed or performed during the hospital encounter of 07/29/16 (from the past 48 hour(s))  Protime-INR     Status: None   Collection Time: 07/29/16 12:21 PM  Result Value Ref Range   Prothrombin Time 13.4 11.4 - 15.2 seconds   INR 1.02   APTT     Status: None   Collection Time: 07/29/16 12:21 PM  Result Value Ref Range   aPTT 33 24 - 36 seconds  CBC     Status: None   Collection Time: 07/29/16 12:21 PM  Result Value Ref Range   WBC 8.5 4.0 - 10.5 K/uL   RBC 4.78 4.22 - 5.81 MIL/uL   Hemoglobin 14.8 13.0 - 17.0 g/dL   HCT 43.8 39.0 - 52.0 %   MCV 91.6 78.0 - 100.0 fL   MCH 31.0 26.0 - 34.0 pg   MCHC 33.8 30.0 - 36.0 g/dL   RDW 13.6 11.5 - 15.5 %   Platelets 239 150 - 400 K/uL  Differential     Status: None  Collection Time: 07/29/16 12:21 PM  Result Value Ref Range   Neutrophils Relative % 68 %   Neutro Abs 5.8 1.7 - 7.7 K/uL   Lymphocytes Relative 17 %   Lymphs Abs 1.5 0.7 - 4.0 K/uL   Monocytes Relative 9 %   Monocytes Absolute 0.8 0.1 - 1.0 K/uL   Eosinophils Relative 5 %   Eosinophils Absolute 0.4 0.0 - 0.7 K/uL   Basophils Relative 1 %   Basophils Absolute 0.1 0.0 - 0.1 K/uL  Comprehensive metabolic panel     Status: Abnormal   Collection Time: 07/29/16 12:21 PM  Result Value Ref Range   Sodium 138 135 - 145 mmol/L   Potassium 4.7 3.5 - 5.1 mmol/L   Chloride 104 101 - 111 mmol/L   CO2 25 22 - 32 mmol/L   Glucose, Bld 194 (H) 65 - 99 mg/dL   BUN 18 6 - 20 mg/dL   Creatinine, Ser 1.37 (H) 0.61 - 1.24 mg/dL   Calcium 9.2 8.9 - 10.3 mg/dL   Total Protein 7.1 6.5 - 8.1 g/dL   Albumin 4.3 3.5 - 5.0 g/dL   AST 33 15 - 41 U/L   ALT 40 17 - 63 U/L   Alkaline Phosphatase 69 38 - 126 U/L   Total Bilirubin 0.4 0.3 - 1.2 mg/dL   GFR calc non Af Amer 50 (L) >60 mL/min   GFR calc Af Amer 58 (L) >60 mL/min     Comment: (NOTE) The eGFR has been calculated using the CKD EPI equation. This calculation has not been validated in all clinical situations. eGFR's persistently <60 mL/min signify possible Chronic Kidney Disease.    Anion gap 9 5 - 15  CBG monitoring, ED     Status: Abnormal   Collection Time: 07/29/16  3:14 PM  Result Value Ref Range   Glucose-Capillary 147 (H) 65 - 99 mg/dL   Comment 1 Notify RN    Comment 2 Document in Chart   Glucose, capillary     Status: Abnormal   Collection Time: 07/29/16 11:50 PM  Result Value Ref Range   Glucose-Capillary 115 (H) 65 - 99 mg/dL   Comment 1 Notify RN    Comment 2 Document in Chart   Troponin I     Status: None   Collection Time: 07/30/16  2:38 AM  Result Value Ref Range   Troponin I <0.03 <0.03 ng/mL  Lipid panel     Status: Abnormal   Collection Time: 07/30/16  2:38 AM  Result Value Ref Range   Cholesterol 164 0 - 200 mg/dL   Triglycerides 211 (H) <150 mg/dL   HDL 22 (L) >40 mg/dL   Total CHOL/HDL Ratio 7.5 RATIO   VLDL 42 (H) 0 - 40 mg/dL   LDL Cholesterol 100 (H) 0 - 99 mg/dL    Comment:        Total Cholesterol/HDL:CHD Risk Coronary Heart Disease Risk Table                     Men   Women  1/2 Average Risk   3.4   3.3  Average Risk       5.0   4.4  2 X Average Risk   9.6   7.1  3 X Average Risk  23.4   11.0        Use the calculated Patient Ratio above and the CHD Risk Table to determine the patient's CHD Risk.  ATP III CLASSIFICATION (LDL):  <100     mg/dL   Optimal  100-129  mg/dL   Near or Above                    Optimal  130-159  mg/dL   Borderline  160-189  mg/dL   High  >190     mg/dL   Very High   TSH     Status: None   Collection Time: 07/30/16  2:38 AM  Result Value Ref Range   TSH 1.639 0.350 - 4.500 uIU/mL    Comment: Performed by a 3rd Generation assay with a functional sensitivity of <=0.01 uIU/mL.  Urine rapid drug screen (hosp performed)not at [facility]     Status: None   Collection Time:  07/30/16  4:11 AM  Result Value Ref Range   Opiates NONE DETECTED NONE DETECTED   Cocaine NONE DETECTED NONE DETECTED   Benzodiazepines NONE DETECTED NONE DETECTED   Amphetamines NONE DETECTED NONE DETECTED   Tetrahydrocannabinol NONE DETECTED NONE DETECTED   Barbiturates NONE DETECTED NONE DETECTED    Comment:        DRUG SCREEN FOR MEDICAL PURPOSES ONLY.  IF CONFIRMATION IS NEEDED FOR ANY PURPOSE, NOTIFY LAB WITHIN 5 DAYS.        LOWEST DETECTABLE LIMITS FOR URINE DRUG SCREEN Drug Class       Cutoff (ng/mL) Amphetamine      1000 Barbiturate      200 Benzodiazepine   803 Tricyclics       212 Opiates          300 Cocaine          300 THC              50   Troponin I     Status: None   Collection Time: 07/30/16  5:57 AM  Result Value Ref Range   Troponin I <0.03 <0.03 ng/mL  Glucose, capillary     Status: None   Collection Time: 07/30/16  6:26 AM  Result Value Ref Range   Glucose-Capillary 95 65 - 99 mg/dL   Comment 1 Notify RN    Comment 2 Document in Chart   Troponin I     Status: None   Collection Time: 07/30/16 11:36 AM  Result Value Ref Range   Troponin I <0.03 <0.03 ng/mL  Basic metabolic panel     Status: Abnormal   Collection Time: 07/30/16 11:36 AM  Result Value Ref Range   Sodium 140 135 - 145 mmol/L   Potassium 4.2 3.5 - 5.1 mmol/L   Chloride 104 101 - 111 mmol/L   CO2 28 22 - 32 mmol/L   Glucose, Bld 119 (H) 65 - 99 mg/dL   BUN 15 6 - 20 mg/dL   Creatinine, Ser 1.26 (H) 0.61 - 1.24 mg/dL   Calcium 9.2 8.9 - 10.3 mg/dL   GFR calc non Af Amer 55 (L) >60 mL/min   GFR calc Af Amer >60 >60 mL/min    Comment: (NOTE) The eGFR has been calculated using the CKD EPI equation. This calculation has not been validated in all clinical situations. eGFR's persistently <60 mL/min signify possible Chronic Kidney Disease.    Anion gap 8 5 - 15  Glucose, capillary     Status: Abnormal   Collection Time: 07/30/16 11:51 AM  Result Value Ref Range   Glucose-Capillary  114 (H) 65 - 99 mg/dL   Comment 1 Notify RN    Comment  2 Document in Chart   Glucose, capillary     Status: Abnormal   Collection Time: 07/30/16  4:57 PM  Result Value Ref Range   Glucose-Capillary 212 (H) 65 - 99 mg/dL   Comment 1 Notify RN    Comment 2 Document in Chart   Glucose, capillary     Status: Abnormal   Collection Time: 07/30/16  9:19 PM  Result Value Ref Range   Glucose-Capillary 115 (H) 65 - 99 mg/dL   Comment 1 Notify RN    Comment 2 Document in Chart   Glucose, capillary     Status: None   Collection Time: 07/31/16  6:22 AM  Result Value Ref Range   Glucose-Capillary 92 65 - 99 mg/dL   Comment 1 Notify RN    Comment 2 Document in Chart      Lipid Panel     Component Value Date/Time   CHOL 164 07/30/2016 0238   TRIG 211 (H) 07/30/2016 0238   HDL 22 (L) 07/30/2016 0238   CHOLHDL 7.5 07/30/2016 0238   VLDL 42 (H) 07/30/2016 0238   LDLCALC 100 (H) 07/30/2016 0238     Lab Results  Component Value Date   HGBA1C 7.2 (H) 06/28/2014     Lab Results  Component Value Date   LDLCALC 100 (H) 07/30/2016   CREATININE 1.26 (H) 07/30/2016     HPI :   Randall Horton is an 71 y.o. male medical history significant of DM on insulin pump, hypertriglyceridemia, GERD  who presented with left sided incoordination and left face/arm/leg sensory numbness with paresthesias. The symptoms were sudden in onset and began on Wednesday, first noticed upon awakening from a nap. He had no focal weakness. He states that when he picked up a coffee pot with his left hand it suddenly went up much higher than normal as though his left arm was out of control. He had an unsteady gait when he was seen in triage. He used to take Plavix but stopped it for a reason he cannot remember. He states that he is allergic to ASA as it causes gastric pain and a sensation of feeling like he is about to sweat.   MRI was obtained, revealing subacute infarction in the posterior limb of the right internal  capsule. 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 Neurology was called to further evaluate.[name] He did not receive IV t-PA.    HOSPITAL COURSE: *  Stroke:  R PLIC infarct secondary to small vessel disease source  Resultant  left face and arm hemiparesis  CT head no acute stroke. Minimal periventricular small vessel disease  MRI  small R posterior limb internal capsule infarct. Suspected Punctate L posterior limb internal capsule infarct.  MRA  no proximal intracranial stenosis. Intracranial atherosclerotic disease elsewhere.  Carotid Doppler  1-39% ICA plaquing. Vertebral artery flow is antegrade  2D Echo  EF 55-60%. No source of embolus  LDL 100, patient started on a statin  HgbA1c 6.7  no antithrombotic prior to admission, now on clopidogrel 75 mg daily  Therapy recommendations:  No PT OT follow-up needed    HLD Lipid panel as above Started on Lipitor 40 mg qhs  DM -Glucose 194 -Uses insulin pump, has been using a pump for the last 10 years -Reports that he prefers to have glucose in 140 range since <100 makes him feel profoundly hypoglycemic Hemoglobin A1c pending -Diabetic education requested  CKD -Creatinine 1.37,  prior 1.52 on 06/29/14  Discharge Exam:   Blood pressure 125/61, pulse 95, temperature 98.6 F (37 C), temperature source Oral, resp. rate 20, height _0  (1.803 m), weight 101.6 kg (224 lb), SpO2 95 %.  General exam: Appears calm and comfortable  Respiratory system: Clear to auscultation. Respiratory effort normal. Cardiovascular system: S1 & S2 heard, RRR. No JVD, murmurs, rubs, gallops or clicks. No pedal edema. Gastrointestinal system: Abdomen is nondistended, soft and nontender. No organomegaly or masses felt. Normal bowel sounds heard. Central nervous system: Alert and oriented. No focal neurological deficits. Extremities: Symmetric 5 x 5 power. Skin: No rashes, lesions or  ulcers Psychiatry: Judgement and insight appear normal. Mood & affect appropriate.     Follow-up Information    Sheela Stack, MD. Call.   Specialty:  Endocrinology Why:  To make follow-up appointment, hospital follow-up Contact information: [place] [address] [phone]        Antony Contras, MD. Call.   Specialties:  Neurology, Radiology Why:  To confirm follow-up appointment in 4-6 weeks Contact information: [address] [place] [address] [phone]           Signed: Reyne Dumas 07/31/2016, 8:02 AM        Time spent >45 mins

## 2016-07-31 NOTE — Progress Notes (Signed)
STROKE TEAM PROGRESS NOTE   SUBJECTIVE (INTERVAL HISTORY) Hi wife is at the bedside. He is sitting on the side of the bed. He has no new complaints. Ready to go home.   OBJECTIVE Temp:  [97.7 F (36.5 C)-98.6 F (37 C)] 98.6 F (37 C) (02/09 0600) Pulse Rate:  [80-95] 95 (02/09 0600) Cardiac Rhythm: Normal sinus rhythm (02/09 0700) Resp:  [15-20] 20 (02/09 0600) BP: (105-143)/(59-76) 125/61 (02/09 0600) SpO2:  [94 %-99 %] 95 % (02/09 123XX123)  Basic Metabolic Panel:   123456:  Lab Results  Component Value Date   HGBA1C 7.2 (H) 06/28/2014    PHYSICAL EXAM Pleasant elderly  male not in distress. . Afebrile. Head is nontraumatic. Neck is supple without bruit.    Cardiac exam no murmur or gallop. Lungs are clear to auscultation. Distal pulses are well felt. Neurological Exam :  Awake alert oriented x 3 normal speech and language. Mild left lower face asymmetry. Tongue midline. No drift. Mild diminished fine finger movements on left. Orbits right over left upper extremity. Mild left grip weak.. Normal sensation . Normal coordination.  ASSESSMENT/PLAN [name] is a 71 y.o. male with history of DM on insulin pump, hypertriglyceridemia, GERD presenting with left sided incoordination and left face/arm/leg sensory numbness with paresthesias. He did not receive IV t-PA.   Stroke:  R PLIC infarct secondary to small vessel disease source  Resultant  left face and arm hemiparesis  CT head no acute stroke. Minimal periventricular small vessel disease  MRI  small R posterior limb internal capsule infarct. Suspected Punctate L posterior limb internal capsule infarct.  MRA  no proximal intracranial stenosis. Intracranial atherosclerotic disease elsewhere.  Carotid Doppler  1-39% ICA plaquing. Vertebral artery flow is antegrade  2D Echo  EF 55-60%. No source of embolus  LDL 100  HgbA1c pending  Lovenox 40 mg sq daily for VTE prophylaxis Diet heart healthy/carb modified Room  service appropriate? Yes; Fluid consistency: Thin  No antithrombotic prior to admission, now on clopidogrel 75 mg daily  Therapy recommendations:  Pending  Disposition:  Pending  Hypertension  Remains Stable  Long-term BP goal normotensive  Hyperlipidemia  Home meds:  No statin  LDL goal < 70  Now on Lipitor 40 mg daily   Continue statin at discharge  Diabetes, type II  has insulin pump  HgbA1c goal < 7.0   Other Stroke Risk Factors  Advanced age  Former Cigarette smoker, quit 48 years ago   Uses smokeless tobacco, snuff  Urine drug screen negative  Obesity, Body mass index is 31.24 kg/m.  Other Active Problems  . Chronic kidney disease, creatinine 1.37  NOTHING FURTHER TO ADD FROM THE STROKE STANDPOINT  Patient has a 10-15% risk of having another stroke over the next year, the highest risk is within 2 weeks of the most recent stroke/TIA (risk of having a stroke following a stroke or TIA is the same).  Ongoing risk factor control by Primary Care Physician  Stroke Service will sign off. Please call should any needs arise.  Follow-up Stroke Clinic at [facility] with Dr. Antony Contras in 2 months, order placed.   Hospital day # [place] for Pager information 07/31/2016 10:17 AM  I have personally examined this patient, reviewed notes, independently viewed imaging studies, participated in medical decision making and plan of care.ROS completed by me personally and pertinent positives fully documented  I have made any additions or  clarifications directly to the above note. Agree with note above.   Antony Contras, MD Medical Director [place] Pager: [phone] 07/31/2016 2:12 PM   To contact Stroke Continuity provider, please refer to [email or web address] After hours, contact General Neurology All

## 2016-07-31 NOTE — Progress Notes (Signed)
Responded to consult to create advanced directive (AD). Explained/discussed form options/process w/ pt and wife. They both may wish to complete, but may not be ready to do so before pt is discharged. Advised them to ask nurse to page for chaplain when they are, and explained that they may take the forms and have them notarized later. Pt had prior AD he will be updating -- one w/ handwritten changes given to one of his drs that he'd already updated. His first was done before he knew his present wife so some others are listed as healthcare agents he no longer would name, such as his boss where he was working at the time he made it. Provided emotional/spiritual support and prayer. Chaplain available for f/u.   07/31/16 0900  Clinical Encounter Type  Visited With Patient and family together  Visit Type Initial;Psychological support;Spiritual support;Social support  Referral From Nurse  Spiritual Encounters  Spiritual Needs Brochure;Prayer;Emotional  Stress Factors  Patient Stress Factors Health changes  Family Stress Factors Family relationships;Health changes   Gerrit Heck, Chaplain

## 2016-08-01 LAB — HEMOGLOBIN A1C
Hgb A1c MFr Bld: 6.7 % — ABNORMAL HIGH (ref 4.8–5.6)
Mean Plasma Glucose: 146 mg/dL

## 2016-08-03 ENCOUNTER — Other Ambulatory Visit: Payer: Self-pay | Admitting: *Deleted

## 2016-08-03 NOTE — Patient Outreach (Signed)
[facility]) Care Management  08/03/2016  Randall Horton 1946/02/09 [id number]  EMMI-Stroke referral for red dashboard -Scheduled follow up appointment:  Telephone call to patient who was advised of reason for follow up call after EMMI-call.  HIPPA verification received from patient.   Patient voices that he was recently discharged this weekend 07/31/2016.  States he has called  MD office to make hospital follow up appointment  but had to leave message and is awaiting call back.  Also states he is awaiting call from stroke MD office for follow up appointments. Patient voices that he has MD offices telephone numbers to call if he needs to return calls. States he is aware that he needs blood work done in 3-5 days .   Patient voices that he has remnants of numbness on left side but is able to walk without using cane but has it if needed.  Falls prevention discussed with patient who voices understanding.   Patient states he is managing his medications & taking as instructed by MD. Voices that he is aware of stroke symptoms and knows to call 911 if symptoms occur.    Patient voices that he has received stroke educational information while in hospital and does not need any more at this time.  EMMI -dashboard has been addressed & call completed.  Plan: Send to care management assistant to close case.  Sherrin Daisy, RN BSN [facility] Management Coordinator [facility] Care Management  [phone]

## 2016-08-06 DIAGNOSIS — R740 Nonspecific elevation of levels of transaminase and lactic acid dehydrogenase [LDH]: Secondary | ICD-10-CM | POA: Diagnosis not present

## 2016-08-06 DIAGNOSIS — E784 Other hyperlipidemia: Secondary | ICD-10-CM | POA: Diagnosis not present

## 2016-08-06 DIAGNOSIS — E559 Vitamin D deficiency, unspecified: Secondary | ICD-10-CM | POA: Diagnosis not present

## 2016-08-06 DIAGNOSIS — E10319 Type 1 diabetes mellitus with unspecified diabetic retinopathy without macular edema: Secondary | ICD-10-CM | POA: Diagnosis not present

## 2016-08-06 DIAGNOSIS — E0590 Thyrotoxicosis, unspecified without thyrotoxic crisis or storm: Secondary | ICD-10-CM | POA: Diagnosis not present

## 2016-08-06 DIAGNOSIS — I639 Cerebral infarction, unspecified: Secondary | ICD-10-CM | POA: Diagnosis not present

## 2016-08-06 DIAGNOSIS — N401 Enlarged prostate with lower urinary tract symptoms: Secondary | ICD-10-CM | POA: Diagnosis not present

## 2016-08-06 DIAGNOSIS — N08 Glomerular disorders in diseases classified elsewhere: Secondary | ICD-10-CM | POA: Diagnosis not present

## 2016-08-19 DIAGNOSIS — E109 Type 1 diabetes mellitus without complications: Secondary | ICD-10-CM | POA: Diagnosis not present

## 2016-08-31 ENCOUNTER — Ambulatory Visit (INDEPENDENT_AMBULATORY_CARE_PROVIDER_SITE_OTHER): Payer: Medicare HMO | Admitting: Neurology

## 2016-08-31 ENCOUNTER — Encounter: Payer: Self-pay | Admitting: Neurology

## 2016-08-31 VITALS — BP 126/75 | HR 99 | Ht 70.0 in | Wt 214.4 lb

## 2016-08-31 DIAGNOSIS — I6381 Other cerebral infarction due to occlusion or stenosis of small artery: Secondary | ICD-10-CM

## 2016-08-31 DIAGNOSIS — I639 Cerebral infarction, unspecified: Secondary | ICD-10-CM

## 2016-08-31 NOTE — Patient Instructions (Signed)
I had a long d/w patient and his wife about his recent lacunar stroke, risk for recurrent stroke/TIAs, personally independently reviewed imaging studies and stroke evaluation results and answered questions.Continue Plavix 75 mg daily for secondary stroke prevention and maintain strict control of hypertension with blood pressure goal below 130/90, diabetes with hemoglobin A1c goal below 6.5% and lipids with LDL cholesterol goal below 70 mg/dL. I also advised the patient to eat a healthy diet with plenty of whole grains, cereals, fruits and vegetables, exercise regularly and maintain ideal body weight Followup in the future with my nurse practitioner in 6 months or call earlier if necessary  Stroke Prevention Some medical conditions and behaviors are associated with an increased chance of having a stroke. You may prevent a stroke by making healthy choices and managing medical conditions. How can I reduce my risk of having a stroke?  Stay physically active. Get at least 30 minutes of activity on most or all days.  Do not smoke. It may also be helpful to avoid exposure to secondhand smoke.  Limit alcohol use. Moderate alcohol use is considered to be:  No more than 2 drinks per day for men.  No more than 1 drink per day for nonpregnant women.  Eat healthy foods. This involves:  Eating 5 or more servings of fruits and vegetables a day.  Making dietary changes that address high blood pressure (hypertension), high cholesterol, diabetes, or obesity.  Manage your cholesterol levels.  Making food choices that are high in fiber and low in saturated fat, trans fat, and cholesterol may control cholesterol levels.  Take any prescribed medicines to control cholesterol as directed by your health care provider.  Manage your diabetes.  Controlling your carbohydrate and sugar intake is recommended to manage diabetes.  Take any prescribed medicines to control diabetes as directed by your health care  provider.  Control your hypertension.  Making food choices that are low in salt (sodium), saturated fat, trans fat, and cholesterol is recommended to manage hypertension.  Ask your health care provider if you need treatment to lower your blood pressure. Take any prescribed medicines to control hypertension as directed by your health care provider.  If you are 73-87 years of age, have your blood pressure checked every 3-5 years. If you are 24 years of age or older, have your blood pressure checked every year.  Maintain a healthy weight.  Reducing calorie intake and making food choices that are low in sodium, saturated fat, trans fat, and cholesterol are recommended to manage weight.  Stop drug abuse.  Avoid taking birth control pills.  Talk to your health care provider about the risks of taking birth control pills if you are over 61 years old, smoke, get migraines, or have ever had a blood clot.  Get evaluated for sleep disorders (sleep apnea).  Talk to your health care provider about getting a sleep evaluation if you snore a lot or have excessive sleepiness.  Take medicines only as directed by your health care provider.  For some people, aspirin or blood thinners (anticoagulants) are helpful in reducing the risk of forming abnormal blood clots that can lead to stroke. If you have the irregular heart rhythm of atrial fibrillation, you should be on a blood thinner unless there is a good reason you cannot take them.  Understand all your medicine instructions.  Make sure that other conditions (such as anemia or atherosclerosis) are addressed. Get help right away if:  You have sudden weakness or numbness of  the face, arm, or leg, especially on one side of the body.  Your face or eyelid droops to one side.  You have sudden confusion.  You have trouble speaking (aphasia) or understanding.  You have sudden trouble seeing in one or both eyes.  You have sudden trouble walking.  You  have dizziness.  You have a loss of balance or coordination.  You have a sudden, severe headache with no known cause.  You have new chest pain or an irregular heartbeat. Any of these symptoms may represent a serious problem that is an emergency. Do not wait to see if the symptoms will go away. Get medical help at once. Call your local emergency services (911 in [place] Do not drive yourself to the hospital. This information is not intended to replace advice given to you by your health care provider. Make sure you discuss any questions you have with your health care provider. Document Released: 07/16/2004 Document Revised: 11/14/2015 Document Reviewed: 12/09/2012 Elsevier Interactive Patient Education  2017 [employer].

## 2016-08-31 NOTE — Progress Notes (Signed)
[facility] [address] [place]. [place] [address] [phone]       OFFICE FOLLOW-UP NOTE  [name] Date of Birth:  24-Oct-1945 Medical Record Number:  [id number]   HPI: [name] is a 9 year Caucasian male seen today for the first office f/u visit after [facility] admission for stroke in Feb 2018. He is accompanied by his wife. History is obtained from the patient, wife and review of hospital records.RHYATT MUSKA is an 71 y.o. male who presented with left sided incoordination and left face/arm/leg sensory numbness with paresthesias. The symptoms were sudden in onset and began on Wednesday, first noticed upon awakening from a nap. He had no focal weakness. He states that when he picked up a coffee pot with his left hand it suddenly went up much higher than normal as though his left arm was out of control. He had an unsteady gait when he was seen in triage. He used to take Plavix but stopped it for a reason he cannot remember. He states that he is allergic to ASA as it causes gastric pain and a sensation of feeling like he is about to sweat.  MRI was obtained, revealing subacute infarction in the posterior limb of the right internal capsule. 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  MRA of the brain showed no proximal large vessel intracranial stenosis but mild atherosclerotic changes diffusely. Carotid ultrasound showed no significant extracranial stenosis. Transthoracic echo showed normal ejection fraction without cardiac source of embolism. LDL cholesterol was elevated at 100 mg percent and hemoglobin A1c at 6.7. Patient was started on Plavix for stroke prevention. He states his done well since discharge. His left-sided strength has improved completely. However he is still has some residual paresthesias on the left side. He is tolerating Plavix well without bleeding or bruising. He is also tolerating Lipitor well  without muscle aches and pains. He complains of some mild paresthesias intermittently in his right eye as well as left foot. He does have mild diabetic neuropathy but feels this is been stable. He does take gabapentin 3 times a day. His blood pressure is well controlled and today it is [id number].  ROS:   14 system review of systems is positive for ringing in the ears, eye itching, cold intolerance, joint pain, back pain, tingling, numbness and all other systems negative  PMH:  Past Medical History:  Diagnosis Date  . Arthritis    "hands; right hip/knee" (07/30/2016)  . At risk for sleep apnea    STOP-BANG= 6     SENT TO PCP 06-25-2014  . Basal cell carcinoma    "right temple & earlobe; frozen, burned, scraped"  . Chronic cough    FORMER SMOKER  . Chronic kidney disease (CKD), stage III (moderate)    Archie Endo 07/29/2016  . Environmental allergies   . Essential hypertension    Archie Endo 07/30/2016  . GERD (gastroesophageal reflux disease)   . H/O cardiovascular stress test 2013   "everything fine" per pt  . High triglycerides   . History of diabetes with ketoacidosis    1980'S  . History of gout   . History of hiatal hernia   . Hyperlipidemia    Archie Endo 07/29/2016  . Incomplete emptying of bladder   . Insulin pump in place   . Pain in right hip    injury 2015  . Pneumonia 2008  . Squamous cell cancer of external ear,  right   . Stroke ([place])     subacute infarction in the posterior limb of the right internal capsule/notes 07/29/2016 "left hand and arm don't feel right yet;" (07/30/2016)  . Tachycardia   . Tinnitus of both ears    "chronic for years"  . Type I diabetes mellitus ([place]) "dx'd ~ 1995"   uses insulin pump (07/30/2016)  . Urgency of urination   . Wears glasses   . Wears partial dentures    UPPER AND LOWER    Social History:  Social History   Social History  . Marital status: Married    Spouse name: N/A  . Number of children: N/A  . Years of education: N/A   Occupational  History  . retired    Social History Main Topics  . Smoking status: Former Smoker    Packs/day: 1.00    Years: 14.00    Types: Cigarettes    Quit date: 06/22/1977  . Smokeless tobacco: Current User    Types: Snuff  . Alcohol use No  . Drug use: No  . Sexual activity: Not Currently   Other Topics Concern  . Not on file   Social History Narrative  . No narrative on file    Medications:   Current Outpatient Prescriptions on File Prior to Visit  Medication Sig Dispense Refill  . acetaminophen (TYLENOL) 500 MG tablet Take 1,000 mg by mouth every 6 (six) hours as needed for moderate pain or headache.    . allopurinol (ZYLOPRIM) 300 MG tablet Take 300 mg by mouth every morning.    [name] atorvastatin (LIPITOR) 40 MG tablet Take 1 tablet (40 mg total) by mouth daily at 6 PM. 30 tablet 2  . diphenhydrAMINE (BENADRYL) 25 MG tablet Take 25 mg by mouth at bedtime as needed for itching (around pump site.).    [name] docusate sodium (COLACE) 100 MG capsule Take 100 mg by mouth 2 (two) times daily as needed for mild constipation.    . fenofibrate 160 MG tablet Take 160 mg by mouth every evening.    [name] guaiFENesin (MUCINEX) 600 MG 12 hr tablet Take 600-1,200 mg by mouth 2 (two) times daily as needed for cough or to loosen phlegm.    . hydrocortisone 1 % ointment Apply topically daily. APPLY TO EARS     . Insulin Human (INSULIN PUMP) SOLN Inject 110-120 each into the skin daily. Novolog.    . loratadine (CLARITIN) 10 MG tablet Take 10 mg by mouth 2 (two) times daily.     . montelukast (SINGULAIR) 10 MG tablet Take 10 mg by mouth every evening.    [name] omeprazole (PRILOSEC OTC) 20 MG tablet Take 20 mg by mouth every morning.    . Pramoxine-Benzyl Alcohol (ITCH-X EX) Apply 1 application topically daily as needed. For itching    . Turmeric 500 MG CAPS Take 1 capsule by mouth 2 (two) times daily.      No current facility-administered medications on file prior to visit.     Allergies:   Allergies  Allergen  Reactions  . Aspirin Other (See Comments)    Severe stomach cramps + constipation.   . Niacin And Related Other (See Comments)    "flushing"  . Penicillins Hives, Itching and Swelling    Has patient had a PCN reaction causing immediate rash, facial/tongue/throat swelling, SOB or lightheadedness with hypotension: YES Has patient had a PCN reaction causing severe rash involving mucus membranes or skin necrosis: NO Has patient had a PCN reaction  that required hospitalization NO Has patient had a PCN reaction occurring within the last 10 years: NO If all of the above answers are "NO", then may proceed with Cephalosporin use.    Physical Exam General: well developed, well nourished Elderly Caucasian male, seated, in no evident distress Head: head normocephalic and atraumatic.  Neck: supple with no carotid or supraclavicular bruits Cardiovascular: regular rate and rhythm, no murmurs Musculoskeletal: no deformity Skin:  no rash/petichiae Vascular:  Normal pulses all extremities Vitals:   08/31/16 0909  BP: 126/75  Pulse: 99   Neurologic Exam Mental Status: Awake and fully alert. Oriented to place and time. Recent and remote memory intact. Attention span, concentration and fund of knowledge appropriate. Mood and affect appropriate.  Cranial Nerves: Fundoscopic exam reveals sharp disc margins. Pupils equal, briskly reactive to light. Extraocular movements full without nystagmus. Visual fields full to confrontation. Hearing intact. Facial sensation intact. Face, tongue, palate moves normally and symmetrically.  Motor: Normal bulk and tone. Normal strength in all tested extremity muscles.Diminished fine finger movements on the left. Orbits right over left upper extremity. Sensory.: intact to touch ,pinprick .position and vibratory sensation.  Coordination: Rapid alternating movements normal in all extremities. Finger-to-nose and heel-to-shin performed accurately bilaterally. Gait and Station:  Arises from chair without difficulty. Stance is normal. Gait demonstrates normal stride length and balance . Able to heel, toe and tandem walk with  difficulty.  Reflexes: 1+ and symmetric. Toes downgoing.   NIHSS  0 Modified Rankin  1  ASSESSMENT: 68 year Caucasian male with a right internal capsule infarct secondary to small vessel disease in the every 2018 was doing well with mild residual paresthesias only. Vascular risk factors of hypertension, diabetes, hyperlipidemia and age    PLAN: I had a long d/w patient and his wife about his recent lacunar stroke, risk for recurrent stroke/TIAs, personally independently reviewed imaging studies and stroke evaluation results and answered questions.Continue Plavix 75 mg daily for secondary stroke prevention and maintain strict control of hypertension with blood pressure goal below 130/90, diabetes with hemoglobin A1c goal below 6.5% and lipids with LDL cholesterol goal below 70 mg/dL. I also advised the patient to eat a healthy diet with plenty of whole grains, cereals, fruits and vegetables, exercise regularly and maintain ideal body weight Followup in the future with my nurse practitioner in 6 months or call earlier if necessary Greater than 50% of time during this 25 minute visit was spent on counseling,explanation of diagnosis, planning of further management, discussion with patient and family and coordination of care Antony Contras, MD  [facility] [address] [place], [place] [address]  Phone [phone] Fax [phone] Note: This document was prepared with digital dictation and possible smart phrase technology. Any transcriptional errors that result from this process are unintentional

## 2016-10-12 DIAGNOSIS — E10319 Type 1 diabetes mellitus with unspecified diabetic retinopathy without macular edema: Secondary | ICD-10-CM | POA: Diagnosis not present

## 2016-10-12 DIAGNOSIS — E113399 Type 2 diabetes mellitus with moderate nonproliferative diabetic retinopathy without macular edema, unspecified eye: Secondary | ICD-10-CM | POA: Diagnosis not present

## 2016-10-12 DIAGNOSIS — E1142 Type 2 diabetes mellitus with diabetic polyneuropathy: Secondary | ICD-10-CM | POA: Diagnosis not present

## 2016-10-12 DIAGNOSIS — D126 Benign neoplasm of colon, unspecified: Secondary | ICD-10-CM | POA: Diagnosis not present

## 2016-10-12 DIAGNOSIS — I6529 Occlusion and stenosis of unspecified carotid artery: Secondary | ICD-10-CM | POA: Diagnosis not present

## 2016-10-12 DIAGNOSIS — E784 Other hyperlipidemia: Secondary | ICD-10-CM | POA: Diagnosis not present

## 2016-10-12 DIAGNOSIS — N08 Glomerular disorders in diseases classified elsewhere: Secondary | ICD-10-CM | POA: Diagnosis not present

## 2016-10-12 DIAGNOSIS — I639 Cerebral infarction, unspecified: Secondary | ICD-10-CM | POA: Diagnosis not present

## 2016-10-12 DIAGNOSIS — M109 Gout, unspecified: Secondary | ICD-10-CM | POA: Diagnosis not present

## 2016-10-12 DIAGNOSIS — N401 Enlarged prostate with lower urinary tract symptoms: Secondary | ICD-10-CM | POA: Diagnosis not present

## 2016-10-12 DIAGNOSIS — R740 Nonspecific elevation of levels of transaminase and lactic acid dehydrogenase [LDH]: Secondary | ICD-10-CM | POA: Diagnosis not present

## 2016-11-21 DIAGNOSIS — H1032 Unspecified acute conjunctivitis, left eye: Secondary | ICD-10-CM | POA: Diagnosis not present

## 2016-11-27 DIAGNOSIS — E109 Type 1 diabetes mellitus without complications: Secondary | ICD-10-CM | POA: Diagnosis not present

## 2017-01-18 DIAGNOSIS — D1801 Hemangioma of skin and subcutaneous tissue: Secondary | ICD-10-CM | POA: Diagnosis not present

## 2017-01-18 DIAGNOSIS — D2271 Melanocytic nevi of right lower limb, including hip: Secondary | ICD-10-CM | POA: Diagnosis not present

## 2017-01-18 DIAGNOSIS — L218 Other seborrheic dermatitis: Secondary | ICD-10-CM | POA: Diagnosis not present

## 2017-01-18 DIAGNOSIS — D2272 Melanocytic nevi of left lower limb, including hip: Secondary | ICD-10-CM | POA: Diagnosis not present

## 2017-01-18 DIAGNOSIS — Z85828 Personal history of other malignant neoplasm of skin: Secondary | ICD-10-CM | POA: Diagnosis not present

## 2017-01-18 DIAGNOSIS — L570 Actinic keratosis: Secondary | ICD-10-CM | POA: Diagnosis not present

## 2017-01-18 DIAGNOSIS — L821 Other seborrheic keratosis: Secondary | ICD-10-CM | POA: Diagnosis not present

## 2017-02-04 DIAGNOSIS — H524 Presbyopia: Secondary | ICD-10-CM | POA: Diagnosis not present

## 2017-02-04 DIAGNOSIS — H52222 Regular astigmatism, left eye: Secondary | ICD-10-CM | POA: Diagnosis not present

## 2017-02-04 DIAGNOSIS — H34831 Tributary (branch) retinal vein occlusion, right eye, with macular edema: Secondary | ICD-10-CM | POA: Diagnosis not present

## 2017-02-04 DIAGNOSIS — H34231 Retinal artery branch occlusion, right eye: Secondary | ICD-10-CM | POA: Diagnosis not present

## 2017-02-04 DIAGNOSIS — E113293 Type 2 diabetes mellitus with mild nonproliferative diabetic retinopathy without macular edema, bilateral: Secondary | ICD-10-CM | POA: Diagnosis not present

## 2017-02-04 DIAGNOSIS — H04123 Dry eye syndrome of bilateral lacrimal glands: Secondary | ICD-10-CM | POA: Diagnosis not present

## 2017-02-04 DIAGNOSIS — Z961 Presence of intraocular lens: Secondary | ICD-10-CM | POA: Diagnosis not present

## 2017-02-05 DIAGNOSIS — N402 Nodular prostate without lower urinary tract symptoms: Secondary | ICD-10-CM | POA: Diagnosis not present

## 2017-02-09 DIAGNOSIS — R3914 Feeling of incomplete bladder emptying: Secondary | ICD-10-CM | POA: Diagnosis not present

## 2017-02-09 DIAGNOSIS — N401 Enlarged prostate with lower urinary tract symptoms: Secondary | ICD-10-CM | POA: Diagnosis not present

## 2017-02-09 DIAGNOSIS — N5201 Erectile dysfunction due to arterial insufficiency: Secondary | ICD-10-CM | POA: Diagnosis not present

## 2017-02-10 DIAGNOSIS — I638 Other cerebral infarction: Secondary | ICD-10-CM | POA: Diagnosis not present

## 2017-02-10 DIAGNOSIS — Z6828 Body mass index (BMI) 28.0-28.9, adult: Secondary | ICD-10-CM | POA: Diagnosis not present

## 2017-02-10 DIAGNOSIS — D126 Benign neoplasm of colon, unspecified: Secondary | ICD-10-CM | POA: Diagnosis not present

## 2017-02-10 DIAGNOSIS — M109 Gout, unspecified: Secondary | ICD-10-CM | POA: Diagnosis not present

## 2017-02-10 DIAGNOSIS — R740 Nonspecific elevation of levels of transaminase and lactic acid dehydrogenase [LDH]: Secondary | ICD-10-CM | POA: Diagnosis not present

## 2017-02-10 DIAGNOSIS — N08 Glomerular disorders in diseases classified elsewhere: Secondary | ICD-10-CM | POA: Diagnosis not present

## 2017-02-10 DIAGNOSIS — N401 Enlarged prostate with lower urinary tract symptoms: Secondary | ICD-10-CM | POA: Diagnosis not present

## 2017-02-10 DIAGNOSIS — E113399 Type 2 diabetes mellitus with moderate nonproliferative diabetic retinopathy without macular edema, unspecified eye: Secondary | ICD-10-CM | POA: Diagnosis not present

## 2017-02-10 DIAGNOSIS — E559 Vitamin D deficiency, unspecified: Secondary | ICD-10-CM | POA: Diagnosis not present

## 2017-02-10 DIAGNOSIS — E10319 Type 1 diabetes mellitus with unspecified diabetic retinopathy without macular edema: Secondary | ICD-10-CM | POA: Diagnosis not present

## 2017-03-02 NOTE — Progress Notes (Signed)
[facility]  PATIENT: Randall Horton DOB: 1946/01/29   REASON FOR VISIT: follow up for stroke 07/2016 HISTORY FROM: Patient    HISTORY OF PRESENT ILLNESS:UPDATE 09/12/2018CM [name], 71 year old male returns for follow-up with history of stroke event in February 2018 he had an onset of left-sided formation and left face arm and leg sensory numbness and paresthesias. He had an unsteady gait. On follow-up visit today he denies further stroke or TIA symptoms. He is on Plavix secondary stroke prevention with minimal bruising and bleeding. He remains on Lipitor without complaints of myalgias he does complain with some hypersensitivity of the skin on his left side. He has a mild peripheral neuropathy. He denies any falls however he does say he has had balance issues since a child. Blood pressure in the office today well controlled at 126/72. He claims his diabetes has been in good control. He returns for reevaluation 08/31/16 Dr. Georgie Randall Horton is a 48 year Caucasian male seen today for the first office f/u visit after [facility] admission for stroke in Feb 2018. He is accompanied by his wife. History is obtained from the patient, wife and review of hospital records.Randall Ou Jonesis an 71 y.o.malewho presented with left sided incoordination and left face/arm/leg sensory numbness with paresthesias. The symptoms were sudden in onset and began on Wednesday, first noticed upon awakening from a nap. He had no focal weakness. He states that when he picked up a coffee pot with his left hand it suddenly went up much higher than normal as though his left arm was out of control. He had an unsteady gait when he was seen in triage. He used to take Plavix but stopped it for a reason he cannot remember. He states that he is allergic to ASA as it causes gastric pain and a sensation of feeling like he is about to sweat.  MRI was obtained, revealing subacute infarction in the posterior limb of the right  internal capsule. 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  MRA of the brain showed no proximal large vessel intracranial stenosis but mild atherosclerotic changes diffusely. Carotid ultrasound showed no significant extracranial stenosis. Transthoracic echo showed normal ejection fraction without cardiac source of embolism. LDL cholesterol was elevated at 100 mg percent and hemoglobin A1c at 6.7. Patient was started on Plavix for stroke prevention. He states his done well since discharge. His left-sided strength has improved completely. However he is still has some residual paresthesias on the left side. He is tolerating Plavix well without bleeding or bruising. He is also tolerating Lipitor well without muscle aches and pains. He complains of some mild paresthesias intermittently in his right eye as well as left foot. He does have mild diabetic neuropathy but feels this is been stable. He does take gabapentin 3 times a day. His blood pressure is well controlled and today it is [id number].   REVIEW OF SYSTEMS: Full 14 system review of systems performed and notable only for those listed, all others are neg:  Constitutional: neg  Cardiovascular: neg Ear/Nose/Throat: neg  Skin: neg Eyes: neg Respiratory: neg Gastroitestinal: neg  Hematology/Lymphatic: neg  Endocrine: Intolerance to cold Musculoskeletal: Joint pain Allergy/Immunology: Environmental allergies Neurological: Paresthesias Psychiatric: neg Sleep : neg   ALLERGIES: Allergies  Allergen Reactions  . Aspirin Other (See Comments)    Severe stomach cramps + constipation.   . Niacin And Related Other (See Comments)    "flushing"  . Penicillins  Hives, Itching and Swelling    Has patient had a PCN reaction causing immediate rash, facial/tongue/throat swelling, SOB or lightheadedness with hypotension: YES Has patient had a PCN reaction causing severe rash involving mucus membranes  or skin necrosis: NO Has patient had a PCN reaction that required hospitalization NO Has patient had a PCN reaction occurring within the last 10 years: NO If all of the above answers are "NO", then may proceed with Cephalosporin use.    HOME MEDICATIONS: Outpatient Medications Prior to Visit  Medication Sig Dispense Refill  . acetaminophen (TYLENOL) 500 MG tablet Take 1,000 mg by mouth every 6 (six) hours as needed for moderate pain or headache.    . allopurinol (ZYLOPRIM) 300 MG tablet Take 300 mg by mouth every morning.    . diphenhydrAMINE (BENADRYL) 25 MG tablet Take 25 mg by mouth at bedtime as needed for itching (around pump site.).    [name] docusate sodium (COLACE) 100 MG capsule Take 100 mg by mouth 2 (two) times daily as needed for mild constipation.    . fenofibrate 160 MG tablet Take 160 mg by mouth every evening.    . gabapentin (NEURONTIN) 300 MG capsule TAKE ONE CAPSULE BY MOUTH three times a day  2  . guaiFENesin (MUCINEX) 600 MG 12 hr tablet Take 600-1,200 mg by mouth 2 (two) times daily as needed for cough or to loosen phlegm.    . hydrocortisone 1 % ointment Apply topically daily. APPLY TO EARS     . Insulin Human (INSULIN PUMP) SOLN Inject 110-120 each into the skin daily. Novolog.    . loratadine (CLARITIN) 10 MG tablet Take 10 mg by mouth 2 (two) times daily.     . montelukast (SINGULAIR) 10 MG tablet Take 10 mg by mouth every evening.    . Multiple Vitamin (MULTIVITAMIN) tablet Take 1 tablet by mouth daily.    . Nutritional Supplements (VITAMIN D PLUS COFACTORS) TABS Take 5,000 each by mouth.    [name] omeprazole (PRILOSEC OTC) 20 MG tablet Take 20 mg by mouth every morning.    . Pramoxine-Benzyl Alcohol (ITCH-X EX) Apply 1 application topically daily as needed. For itching    . traMADol (ULTRAM) 50 MG tablet Take by mouth every 6 (six) hours as needed.    [name] atorvastatin (LIPITOR) 40 MG tablet Take 1 tablet (40 mg total) by mouth daily at 6 PM. 30 tablet 2  . Turmeric 500 MG  CAPS Take 1 capsule by mouth 2 (two) times daily.     . diazepam (VALIUM) 10 MG tablet TAKE 1 TABLET BY MOUTH 1 HOUR PRIOR TO PROCEDURE  0   No facility-administered medications prior to visit.     PAST MEDICAL HISTORY: Past Medical History:  Diagnosis Date  . Arthritis    "hands; right hip/knee" (07/30/2016)  . At risk for sleep apnea    STOP-BANG= 6     SENT TO PCP 06-25-2014  . Basal cell carcinoma    "right temple & earlobe; frozen, burned, scraped"  . Chronic cough    FORMER SMOKER  . Chronic kidney disease (CKD), stage III (moderate)    Archie Endo 07/29/2016  . Environmental allergies   . Essential hypertension    Archie Endo 07/30/2016  . GERD (gastroesophageal reflux disease)   . H/O cardiovascular stress test 2013   "everything fine" per pt  . High triglycerides   . History of diabetes with ketoacidosis    1980'S  . History of gout   . History of  hiatal hernia   . Hyperlipidemia    Archie Endo 07/29/2016  . Incomplete emptying of bladder   . Insulin pump in place   . Pain in right hip    injury 2015  . Pneumonia 2008  . Squamous cell cancer of external ear, right   . Stroke [facility])     subacute infarction in the posterior limb of the right internal capsule/notes 07/29/2016 "left hand and arm don't feel right yet;" (07/30/2016)  . Tachycardia   . Tinnitus of both ears    "chronic for years"  . Type I diabetes mellitus ([place]) "dx'd ~ 1995"   uses insulin pump (07/30/2016)  . Urgency of urination   . Wears glasses   . Wears partial dentures    UPPER AND LOWER    PAST SURGICAL HISTORY: Past Surgical History:  Procedure Laterality Date  . CATARACT EXTRACTION W/ INTRAOCULAR LENS  IMPLANT, BILATERAL  2004 & 2005  . CIRCUMCISION  1960's   with Hernia Repair   . COLONOSCOPY    . CYSTOSCOPY WITH URETHRAL DILATATION N/A 11/02/2014   Procedure: CYSTOSCOPY WITH URETHRAL DILATATION AND  POSSIBLE COOK DILATIION;  Surgeon: Carolan Clines, MD;  Location: [place];  Service:  Urology;  Laterality: N/A;  . ESOPHAGOGASTRODUODENOSCOPY ENDOSCOPY  2009  . EXCISIONAL HEMORRHOIDECTOMY    . EYE SURGERY Right    "for diabetic retinopathy"  . HEMORRHOID BANDING    . INGUINAL HERNIA REPAIR Bilateral late 1950's - 1960s   "got scrotum caught in accident; had to reattach; had to do one side a 2nd time"  . SQUAMOUS CELL CARCINOMA EXCISION Right    "[facility]love"  . TRANSURETHRAL RESECTION OF PROSTATE N/A 06/28/2014   Procedure: TRANSURETHRAL RESECTION OF THE PROSTATE (TURP);  Surgeon: Ailene Rud, MD;  Location: [facility];  Service: Urology;  Laterality: N/A;    FAMILY HISTORY: Family History  Problem Relation Age of Onset  . Heart disease Mother   . Diabetes Father   . Stroke Father   . Colon cancer Neg Hx     SOCIAL HISTORY: Social History   Social History  . Marital status: Married    Spouse name: N/A  . Number of children: N/A  . Years of education: N/A   Occupational History  . retired    Social History Main Topics  . Smoking status: Former Smoker    Packs/day: 1.00    Years: 14.00    Types: Cigarettes    Quit date: 06/22/1977  . Smokeless tobacco: Current User    Types: Snuff  . Alcohol use No  . Drug use: No  . Sexual activity: Not Currently   Other Topics Concern  . Not on file   Social History Narrative  . No narrative on file     PHYSICAL EXAM  Vitals:   03/03/17 0913  BP: 126/72  Pulse: 76  Weight: 212 lb 6.4 oz (96.3 kg)   Body mass index is 30.48 kg/m.  Generalized: Well developed, Obese male in no acute distress  Head: normocephalic and atraumatic,. Oropharynx benign  Neck: Supple, no carotid bruits  Cardiac: Regular rate rhythm, no murmur  Musculoskeletal: No deformity   Neurological examination   Mentation: Alert oriented to time, place, history taking. Attention span and concentration appropriate. Recent and remote memory intact.  Follows all commands speech and language fluent.   Cranial  nerve II-XII: Pupils were equal round reactive to light extraocular movements were full, visual field were full on confrontational test.  Facial sensation and strength were normal. hearing was intact to finger rubbing bilaterally. Uvula tongue midline. head turning and shoulder shrug were normal and symmetric.Tongue protrusion into cheek strength was normal. Motor: normal bulk and tone, full strength in the BUE, BLE,  Sensory: normal and symmetric to light touch, pinprick, and  Vibration in the upper and lower extremities  Coordination: finger-nose-finger, heel-to-shin bilaterally, no dysmetria Reflexes: Symmetric upper and lower plantar responses were flexor bilaterally. Gait and Station: Rising up from seated position without assistance, normal stance,  moderate stride, good arm swing, smooth turning, able to perform tiptoe, and heel walking without difficulty. Tandem gait is steady. No assistive device  DIAGNOSTIC DATA (LABS, IMAGING, TESTING) - I reviewed patient records, labs, notes, testing and imaging myself where available.  Lab Results  Component Value Date   WBC 8.5 07/29/2016   HGB 14.8 07/29/2016   HCT 43.8 07/29/2016   MCV 91.6 07/29/2016   PLT 239 07/29/2016      Component Value Date/Time   NA 140 07/30/2016 1136   K 4.2 07/30/2016 1136   CL 104 07/30/2016 1136   CO2 28 07/30/2016 1136   GLUCOSE 119 (H) 07/30/2016 1136   BUN 15 07/30/2016 1136   CREATININE 1.26 (H) 07/30/2016 1136   CALCIUM 9.2 07/30/2016 1136   PROT 7.1 07/29/2016 1221   ALBUMIN 4.3 07/29/2016 1221   AST 33 07/29/2016 1221   ALT 40 07/29/2016 1221   ALKPHOS 69 07/29/2016 1221   BILITOT 0.4 07/29/2016 1221   GFRNONAA 55 (L) 07/30/2016 1136   GFRAA >60 07/30/2016 1136   Lab Results  Component Value Date   CHOL 164 07/30/2016   HDL 22 (L) 07/30/2016   LDLCALC 100 (H) 07/30/2016   TRIG 211 (H) 07/30/2016   CHOLHDL 7.5 07/30/2016   Lab Results  Component Value Date   HGBA1C 6.7 (H) 07/31/2016     No results found for: [id number] Lab Results  Component Value Date   TSH 1.639 07/30/2016      ASSESSMENT AND PLAN 64 year Caucasian male with a right internal capsule infarct secondary to small vessel disease in February 2018 and doing well with mild residual paresthesias only. Vascular risk factors of hypertension, diabetes, hyperlipidemia and age    PLAN: Continue Plavix 75 mg daily for secondary stroke prevention  Maintain strict control of hypertension with blood pressure goal below 130/90, today's reading 126/72  diabetes with hemoglobin A1c goal below 6.5% continue diabetic medications lipids with LDL cholesterol goal below 70 mg/dL. continue Lipitor Eat a healthy diet with plenty of whole grains, cereals, fruits and vegetables, Exercise regularly  by walking at least 5 x weekly Continue gabapentin for paresthesias  Followup in 6 months if stable at that time will dismiss Dennie Bible, [facility], [facility], APRN  [facility] [address], [place], [place] [address] [phone]

## 2017-03-03 ENCOUNTER — Ambulatory Visit (INDEPENDENT_AMBULATORY_CARE_PROVIDER_SITE_OTHER): Payer: Medicare HMO | Admitting: Nurse Practitioner

## 2017-03-03 ENCOUNTER — Encounter: Payer: Self-pay | Admitting: Nurse Practitioner

## 2017-03-03 ENCOUNTER — Ambulatory Visit: Payer: Medicare HMO | Admitting: Nurse Practitioner

## 2017-03-03 VITALS — BP 126/72 | HR 76 | Wt 212.4 lb

## 2017-03-03 DIAGNOSIS — I10 Essential (primary) hypertension: Secondary | ICD-10-CM

## 2017-03-03 DIAGNOSIS — E785 Hyperlipidemia, unspecified: Secondary | ICD-10-CM

## 2017-03-03 DIAGNOSIS — I6300 Cerebral infarction due to thrombosis of unspecified precerebral artery: Secondary | ICD-10-CM

## 2017-03-03 NOTE — Patient Instructions (Signed)
Continue Plavix 75 mg daily for secondary stroke prevention  Maintain strict control of hypertension with blood pressure goal below 130/90, today's reading 126/72  diabetes with hemoglobin A1c goal below 6.5% continue diabetic medications lipids with LDL cholesterol goal below 70 mg/dL. continue Lipitor Eat a healthy diet with plenty of whole grains, cereals, fruits and vegetables, Exercise regularly  By walking Continue gabapentin for neuropathic pain  Followup in 6 months

## 2017-03-04 NOTE — Progress Notes (Signed)
I agree with the above plan 

## 2017-04-05 DIAGNOSIS — Z23 Encounter for immunization: Secondary | ICD-10-CM | POA: Diagnosis not present

## 2017-04-13 DIAGNOSIS — E109 Type 1 diabetes mellitus without complications: Secondary | ICD-10-CM | POA: Diagnosis not present

## 2017-05-03 DIAGNOSIS — E109 Type 1 diabetes mellitus without complications: Secondary | ICD-10-CM | POA: Diagnosis not present

## 2017-06-28 DIAGNOSIS — E559 Vitamin D deficiency, unspecified: Secondary | ICD-10-CM | POA: Diagnosis not present

## 2017-06-28 DIAGNOSIS — E113399 Type 2 diabetes mellitus with moderate nonproliferative diabetic retinopathy without macular edema, unspecified eye: Secondary | ICD-10-CM | POA: Diagnosis not present

## 2017-06-28 DIAGNOSIS — R740 Nonspecific elevation of levels of transaminase and lactic acid dehydrogenase [LDH]: Secondary | ICD-10-CM | POA: Diagnosis not present

## 2017-06-28 DIAGNOSIS — Z8673 Personal history of transient ischemic attack (TIA), and cerebral infarction without residual deficits: Secondary | ICD-10-CM | POA: Diagnosis not present

## 2017-06-28 DIAGNOSIS — Z6829 Body mass index (BMI) 29.0-29.9, adult: Secondary | ICD-10-CM | POA: Diagnosis not present

## 2017-06-28 DIAGNOSIS — D126 Benign neoplasm of colon, unspecified: Secondary | ICD-10-CM | POA: Diagnosis not present

## 2017-06-28 DIAGNOSIS — E0590 Thyrotoxicosis, unspecified without thyrotoxic crisis or storm: Secondary | ICD-10-CM | POA: Diagnosis not present

## 2017-06-28 DIAGNOSIS — M1000 Idiopathic gout, unspecified site: Secondary | ICD-10-CM | POA: Diagnosis not present

## 2017-06-28 DIAGNOSIS — E10319 Type 1 diabetes mellitus with unspecified diabetic retinopathy without macular edema: Secondary | ICD-10-CM | POA: Diagnosis not present

## 2017-06-28 DIAGNOSIS — E7849 Other hyperlipidemia: Secondary | ICD-10-CM | POA: Diagnosis not present

## 2017-06-28 DIAGNOSIS — E1142 Type 2 diabetes mellitus with diabetic polyneuropathy: Secondary | ICD-10-CM | POA: Diagnosis not present

## 2017-07-13 DIAGNOSIS — E109 Type 1 diabetes mellitus without complications: Secondary | ICD-10-CM | POA: Diagnosis not present

## 2017-07-16 DIAGNOSIS — E109 Type 1 diabetes mellitus without complications: Secondary | ICD-10-CM | POA: Diagnosis not present

## 2017-07-23 DIAGNOSIS — D2272 Melanocytic nevi of left lower limb, including hip: Secondary | ICD-10-CM | POA: Diagnosis not present

## 2017-07-23 DIAGNOSIS — D1801 Hemangioma of skin and subcutaneous tissue: Secondary | ICD-10-CM | POA: Diagnosis not present

## 2017-07-23 DIAGNOSIS — D225 Melanocytic nevi of trunk: Secondary | ICD-10-CM | POA: Diagnosis not present

## 2017-07-23 DIAGNOSIS — N401 Enlarged prostate with lower urinary tract symptoms: Secondary | ICD-10-CM | POA: Diagnosis not present

## 2017-07-23 DIAGNOSIS — L570 Actinic keratosis: Secondary | ICD-10-CM | POA: Diagnosis not present

## 2017-07-23 DIAGNOSIS — L249 Irritant contact dermatitis, unspecified cause: Secondary | ICD-10-CM | POA: Diagnosis not present

## 2017-07-23 DIAGNOSIS — L821 Other seborrheic keratosis: Secondary | ICD-10-CM | POA: Diagnosis not present

## 2017-07-23 DIAGNOSIS — Z85828 Personal history of other malignant neoplasm of skin: Secondary | ICD-10-CM | POA: Diagnosis not present

## 2017-07-23 DIAGNOSIS — D2271 Melanocytic nevi of right lower limb, including hip: Secondary | ICD-10-CM | POA: Diagnosis not present

## 2017-08-05 DIAGNOSIS — N402 Nodular prostate without lower urinary tract symptoms: Secondary | ICD-10-CM | POA: Diagnosis not present

## 2017-08-05 DIAGNOSIS — N5201 Erectile dysfunction due to arterial insufficiency: Secondary | ICD-10-CM | POA: Diagnosis not present

## 2017-08-26 DIAGNOSIS — M2550 Pain in unspecified joint: Secondary | ICD-10-CM | POA: Diagnosis not present

## 2017-08-26 DIAGNOSIS — R5383 Other fatigue: Secondary | ICD-10-CM | POA: Diagnosis not present

## 2017-08-26 DIAGNOSIS — M7989 Other specified soft tissue disorders: Secondary | ICD-10-CM | POA: Diagnosis not present

## 2017-08-26 DIAGNOSIS — E669 Obesity, unspecified: Secondary | ICD-10-CM | POA: Diagnosis not present

## 2017-08-30 NOTE — Progress Notes (Signed)
[facility]  PATIENT: Randall Horton DOB: Jul 18, 1945   REASON FOR VISIT: follow up for stroke 07/2016 HISTORY FROM: Patient and wife    HISTORY OF PRESENT ILLNESS:UPDATE 3/12/2019[name], 72 year old male returns for follow-up with history of stroke event in February 2018 with sudden onset left-sided numbness in left face and leg numbness.  He is currently on Plavix for secondary stroke prevention without further stroke or TIA symptoms.  He has minimal bruising and no bleeding.  In addition he states the numbness that was on his left side has completely gone away since last seen.  He remains on Lipitor without myalgias.  He is diabetic and tries to keep his CBGs around 100-120.  Blood pressure in the office today 144/77.  He is independent in all activities of daily living.  He returns for reevaluation UPDATE 09/12/2018[name], 72 year old male returns for follow-up with history of stroke event in February 2018 he had an onset of left-sided numbness and left face arm and leg sensory numbness and paresthesias. He had an unsteady gait. On follow-up visit today he denies further stroke or TIA symptoms. He is on Plavix secondary stroke prevention with minimal bruising and bleeding. He remains on Lipitor without complaints of myalgias he does complain with some hypersensitivity of the skin on his left side. He has a mild peripheral neuropathy. He denies any falls however he does say he has had balance issues since a child. Blood pressure in the office today well controlled at 126/72. He claims his diabetes has been in good control. He returns for reevaluation 08/31/16 Dr. Georgie Horton is a 71 year Caucasian male seen today for the first office f/u visit after [facility] admission for stroke in Feb 2018. He is accompanied by his wife. History is obtained from the patient, wife and review of hospital records.Randall Ambrocio Jonesis an 72 y.o.malewho presented with left sided incoordination  and left face/arm/leg sensory numbness with paresthesias. The symptoms were sudden in onset and began on Wednesday, first noticed upon awakening from a nap. He had no focal weakness. He states that when he picked up a coffee pot with his left hand it suddenly went up much higher than normal as though his left arm was out of control. He had an unsteady gait when he was seen in triage. He used to take Plavix but stopped it for a reason he cannot remember. He states that he is allergic to ASA as it causes gastric pain and a sensation of feeling like he is about to sweat.  MRI was obtained, revealing subacute infarction in the posterior limb of the right internal capsule. 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  MRA of the brain showed no proximal large vessel intracranial stenosis but mild atherosclerotic changes diffusely. Carotid ultrasound showed no significant extracranial stenosis. Transthoracic echo showed normal ejection fraction without cardiac source of embolism. LDL cholesterol was elevated at 100 mg percent and hemoglobin A1c at 6.7. Patient was started on Plavix for stroke prevention. He states his done well since discharge. His left-sided strength has improved completely. However he is still has some residual paresthesias on the left side. He is tolerating Plavix well without bleeding or bruising. He is also tolerating Lipitor well without muscle aches and pains. He complains of some mild paresthesias intermittently in his right eye as well as left foot. He does have mild diabetic neuropathy but feels this is been stable.  He does take gabapentin 3 times a day. His blood pressure is well controlled and today it is [id number].   REVIEW OF SYSTEMS: Full 14 system review of systems performed and notable only for those listed, all others are neg:  Constitutional: neg  Cardiovascular: neg Ear/Nose/Throat: neg  Skin: neg Eyes: neg Respiratory:  neg Gastroitestinal: neg  Hematology/Lymphatic: neg  Endocrine: Intolerance to cold Musculoskeletal: Joint pain Allergy/Immunology: Environmental allergies Neurological: Paresthesias Psychiatric: neg Sleep : neg   ALLERGIES: Allergies  Allergen Reactions  . Aspirin Other (See Comments)    Severe stomach cramps + constipation.   . Niacin And Related Other (See Comments)    "flushing"  . Penicillins Hives, Itching and Swelling    Has patient had a PCN reaction causing immediate rash, facial/tongue/throat swelling, SOB or lightheadedness with hypotension: YES Has patient had a PCN reaction causing severe rash involving mucus membranes or skin necrosis: NO Has patient had a PCN reaction that required hospitalization NO Has patient had a PCN reaction occurring within the last 10 years: NO If all of the above answers are "NO", then may proceed with Cephalosporin use.    HOME MEDICATIONS: Outpatient Medications Prior to Visit  Medication Sig Dispense Refill  . acetaminophen (TYLENOL) 500 MG tablet Take 1,000 mg by mouth every 6 (six) hours as needed for moderate pain or headache.    . allopurinol (ZYLOPRIM) 300 MG tablet Take 300 mg by mouth every morning.    . clopidogrel (PLAVIX) 75 MG tablet Take 75 mg by mouth daily.    . diphenhydrAMINE (BENADRYL) 25 MG tablet Take 25 mg by mouth at bedtime as needed for itching (around pump site.).    [name] docusate sodium (COLACE) 100 MG capsule Take 100 mg by mouth 2 (two) times daily as needed for mild constipation.    . fenofibrate 160 MG tablet Take 160 mg by mouth every evening.    . gabapentin (NEURONTIN) 300 MG capsule TAKE ONE CAPSULE BY MOUTH three times a day  2  . hydrocortisone 1 % ointment Apply topically daily. APPLY TO EARS     . Insulin Human (INSULIN PUMP) SOLN Inject 110-120 each into the skin daily. Novolog.    . loratadine (CLARITIN) 10 MG tablet Take 10 mg by mouth 2 (two) times daily.     . montelukast (SINGULAIR) 10 MG tablet  Take 10 mg by mouth every evening.    . Multiple Vitamin (MULTIVITAMIN) tablet Take 1 tablet by mouth daily.    . Nutritional Supplements (VITAMIN D PLUS COFACTORS) TABS Take 5,000 each by mouth.    [name] omeprazole (PRILOSEC OTC) 20 MG tablet Take 20 mg by mouth every morning.    . traMADol (ULTRAM) 50 MG tablet Take by mouth every 6 (six) hours as needed.    [name] atorvastatin (LIPITOR) 40 MG tablet Take 1 tablet (40 mg total) by mouth daily at 6 PM. 30 tablet 2  . guaiFENesin (MUCINEX) 600 MG 12 hr tablet Take 600-1,200 mg by mouth 2 (two) times daily as needed for cough or to loosen phlegm.    . Pramoxine-Benzyl Alcohol (ITCH-X EX) Apply 1 application topically daily as needed. For itching    . Turmeric 500 MG CAPS Take 1 capsule by mouth 2 (two) times daily.      No facility-administered medications prior to visit.     PAST MEDICAL HISTORY: Past Medical History:  Diagnosis Date  . Arthritis    "hands; right hip/knee" (07/30/2016)  . At risk for sleep  apnea    STOP-BANG= 6     SENT TO PCP 06-25-2014  . Basal cell carcinoma    "right temple & earlobe; frozen, burned, scraped"  . Chronic cough    FORMER SMOKER  . Chronic kidney disease (CKD), stage III (moderate) ([place])    Archie Endo 07/29/2016  . Environmental allergies   . Essential hypertension    Archie Endo 07/30/2016  . GERD (gastroesophageal reflux disease)   . H/O cardiovascular stress test 2013   "everything fine" per pt  . High triglycerides   . History of diabetes with ketoacidosis    1980'S  . History of gout   . History of hiatal hernia   . Hyperlipidemia    Archie Endo 07/29/2016  . Incomplete emptying of bladder   . Insulin pump in place   . Pain in right hip    injury 2015  . Pneumonia 2008  . Squamous cell cancer of external ear, right   . Stroke [facility])     subacute infarction in the posterior limb of the right internal capsule/notes 07/29/2016 "left hand and arm don't feel right yet;" (07/30/2016)  . Tachycardia   . Tinnitus of both  ears    "chronic for years"  . Type I diabetes mellitus ([place]) "dx'd ~ 1995"   uses insulin pump (07/30/2016)  . Urgency of urination   . Wears glasses   . Wears partial dentures    UPPER AND LOWER    PAST SURGICAL HISTORY: Past Surgical History:  Procedure Laterality Date  . CATARACT EXTRACTION W/ INTRAOCULAR LENS  IMPLANT, BILATERAL  2004 & 2005  . CIRCUMCISION  1960's   with Hernia Repair   . COLONOSCOPY    . CYSTOSCOPY WITH URETHRAL DILATATION N/A 11/02/2014   Procedure: CYSTOSCOPY WITH URETHRAL DILATATION AND  POSSIBLE COOK DILATIION;  Surgeon: Carolan Clines, MD;  Location: [place];  Service: Urology;  Laterality: N/A;  . ESOPHAGOGASTRODUODENOSCOPY ENDOSCOPY  2009  . EXCISIONAL HEMORRHOIDECTOMY    . EYE SURGERY Right    "for diabetic retinopathy"  . HEMORRHOID BANDING    . INGUINAL HERNIA REPAIR Bilateral late 1950's - 1960s   "got scrotum caught in accident; had to reattach; had to do one side a 2nd time"  . SQUAMOUS CELL CARCINOMA EXCISION Right    "[place]"  . TRANSURETHRAL RESECTION OF PROSTATE N/A 06/28/2014   Procedure: TRANSURETHRAL RESECTION OF THE PROSTATE (TURP);  Surgeon: Ailene Rud, MD;  Location[facility];  Service: Urology;  Laterality: N/A;    FAMILY HISTORY: Family History  Problem Relation Age of Onset  . Heart disease Mother   . Diabetes Father   . Stroke Father   . Colon cancer Neg Hx     SOCIAL HISTORY: Social History   Socioeconomic History  . Marital status: Married    Spouse name: Not on file  . Number of children: Not on file  . Years of education: Not on file  . Highest education level: Not on file  Social Needs  . Financial resource strain: Not on file  . Food insecurity - worry: Not on file  . Food insecurity - inability: Not on file  . Transportation needs - medical: Not on file  . Transportation needs - non-medical: Not on file  Occupational History  . Occupation: retired  Tobacco  Use  . Smoking status: Former Smoker    Packs/day: 1.00    Years: 14.00    Pack years: 14.00    Types: Cigarettes  Last attempt to quit: 06/22/1977    Years since quitting: 40.2  . Smokeless tobacco: Current User    Types: Snuff  Substance and Sexual Activity  . Alcohol use: No    Alcohol/week: 0.0 oz  . Drug use: No  . Sexual activity: Not Currently  Other Topics Concern  . Not on file  Social History Narrative  . Not on file     PHYSICAL EXAM  Vitals:   08/31/17 0851  BP: (!) 144/77  Pulse: 72  Weight: 212 lb (96.2 kg)   Body mass index is 30.42 kg/m.  Generalized: Well developed, Obese male in no acute distress  Head: normocephalic and atraumatic,. Oropharynx benign  Neck: Supple, no carotid bruits  Cardiac: Regular rate rhythm, no murmur  Musculoskeletal: No deformity   Neurological examination   Mentation: Alert oriented to time, place, history taking. Attention span and concentration appropriate. Recent and remote memory intact.  Follows all commands speech and language fluent.   Cranial nerve II-XII: Pupils were equal round reactive to light extraocular movements were full, visual field were full on confrontational test. Facial sensation and strength were normal. hearing was intact to finger rubbing bilaterally. Uvula tongue midline. head turning and shoulder shrug were normal and symmetric.Tongue protrusion into cheek strength was normal. Motor: normal bulk and tone, full strength in the BUE, BLE,  Sensory: normal and symmetric to light touch,  in the upper and lower extremities  Coordination: finger-nose-finger, heel-to-shin bilaterally, no dysmetria Reflexes: Symmetric upper and lower plantar responses were flexor bilaterally. Gait and Station: Rising up from seated position without assistance, normal stance,  moderate stride, good arm swing, smooth turning, able to perform tiptoe, and heel walking without difficulty. Tandem gait is steady. No assistive  device  DIAGNOSTIC DATA (LABS, IMAGING, TESTING) - I reviewed patient records, labs, notes, testing and imaging myself where available.  Lab Results  Component Value Date   WBC 8.5 07/29/2016   HGB 14.8 07/29/2016   HCT 43.8 07/29/2016   MCV 91.6 07/29/2016   PLT 239 07/29/2016      Component Value Date/Time   NA 140 07/30/2016 1136   K 4.2 07/30/2016 1136   CL 104 07/30/2016 1136   CO2 28 07/30/2016 1136   GLUCOSE 119 (H) 07/30/2016 1136   BUN 15 07/30/2016 1136   CREATININE 1.26 (H) 07/30/2016 1136   CALCIUM 9.2 07/30/2016 1136   PROT 7.1 07/29/2016 1221   ALBUMIN 4.3 07/29/2016 1221   AST 33 07/29/2016 1221   ALT 40 07/29/2016 1221   ALKPHOS 69 07/29/2016 1221   BILITOT 0.4 07/29/2016 1221   GFRNONAA 55 (L) 07/30/2016 1136   GFRAA >60 07/30/2016 1136   Lab Results  Component Value Date   CHOL 164 07/30/2016   HDL 22 (L) 07/30/2016   LDLCALC 100 (H) 07/30/2016   TRIG 211 (H) 07/30/2016   CHOLHDL 7.5 07/30/2016   Lab Results  Component Value Date   HGBA1C 6.7 (H) 07/31/2016    Lab Results  Component Value Date   TSH 1.639 07/30/2016      ASSESSMENT AND PLAN 50 year Caucasian male with a right internal capsule infarct secondary to small vessel disease in February 2018 and doing well with resolution of numbness left side.Vascular risk factors of hypertension, diabetes, hyperlipidemia and age    PLAN: Continue Plavix 75 mg daily for secondary stroke prevention  Maintain strict control of hypertension with blood pressure goal below 130/90,  diabetes with hemoglobin A1c goal below 6.5% continue  diabetic medications lipids with LDL cholesterol goal below 70 mg/dL. continue Lipitor Eat a healthy diet with plenty of whole grains, cereals, fruits and vegetables,  Exercise regularly  by walking at least 5 x weekly for 30 min  Will discharge from stroke clinic Continue follow-up with primary care Dr Forde Dandy stroke risk factor modification and labs  Dennie Bible, [facility], [facility], [facility] [address] [place], [place] [address] [phone]

## 2017-08-31 ENCOUNTER — Ambulatory Visit (INDEPENDENT_AMBULATORY_CARE_PROVIDER_SITE_OTHER): Payer: Medicare HMO | Admitting: Nurse Practitioner

## 2017-08-31 ENCOUNTER — Encounter: Payer: Self-pay | Admitting: Nurse Practitioner

## 2017-08-31 VITALS — BP 144/77 | HR 72 | Wt 212.0 lb

## 2017-08-31 DIAGNOSIS — E1049 Type 1 diabetes mellitus with other diabetic neurological complication: Secondary | ICD-10-CM

## 2017-08-31 DIAGNOSIS — Z8673 Personal history of transient ischemic attack (TIA), and cerebral infarction without residual deficits: Secondary | ICD-10-CM | POA: Diagnosis not present

## 2017-08-31 DIAGNOSIS — E785 Hyperlipidemia, unspecified: Secondary | ICD-10-CM | POA: Diagnosis not present

## 2017-08-31 NOTE — Patient Instructions (Addendum)
Continue Plavix 75 mg daily for secondary stroke prevention  Maintain strict control of hypertension with blood pressure goal below 130/90,  diabetes with hemoglobin A1c goal below 6.5% continue diabetic medications lipids with LDL cholesterol goal below 70 mg/dL. continue Lipitor Eat a healthy diet with plenty of whole grains, cereals, fruits and vegetables,  Exercise regularly  by walking at least 5 x weekly  Will discharge from stroke clinic Continue follow-up with primary care Dr Forde Dandy stroke risk factor modification and labs   Stroke Prevention Some medical conditions and behaviors are associated with a higher chance of having a stroke. You can help prevent a stroke by making nutrition, lifestyle, and other changes, including managing any medical conditions you may have. What nutrition changes can be made?  Eat healthy foods. You can do this by: ? Choosing foods high in fiber, such as fresh fruits and vegetables and whole grains. ? Eating at least 5 or more servings of fruits and vegetables a day. Try to fill half of your plate at each meal with fruits and vegetables. ? Choosing lean protein foods, such as lean cuts of meat, poultry without skin, fish, tofu, beans, and nuts. ? Eating low-fat dairy products. ? Avoiding foods that are high in salt (sodium). This can help lower blood pressure. ? Avoiding foods that have saturated fat, trans fat, and cholesterol. This can help prevent high cholesterol. ? Avoiding processed and premade foods.  Follow your health care provider's specific guidelines for losing weight, controlling high blood pressure (hypertension), lowering high cholesterol, and managing diabetes. These may include: ? Reducing your daily calorie intake. ? Limiting your daily sodium intake to 1,500 milligrams (mg). ? Using only healthy fats for cooking, such as olive oil, canola oil, or sunflower oil. ? Counting your daily carbohydrate intake. What lifestyle changes can be  made?  Maintain a healthy weight. Talk to your health care provider about your ideal weight.  Get at least 30 minutes of moderate physical activity at least 5 days a week. Moderate activity includes brisk walking, biking, and swimming.  Do not use any products that contain nicotine or tobacco, such as cigarettes and e-cigarettes. If you need help quitting, ask your health care provider. It may also be helpful to avoid exposure to secondhand smoke.  Limit alcohol intake to no more than 1 drink a day for nonpregnant women and 2 drinks a day for men. One drink equals 12 oz of beer, 5 oz of wine, or 1 oz of hard liquor.  Stop any illegal drug use.  Avoid taking birth control pills. Talk to your health care provider about the risks of taking birth control pills if: ? You are over 1 years old. ? You smoke. ? You get migraines. ? You have ever had a blood clot. What other changes can be made?  Manage your cholesterol levels. ? Eating a healthy diet is important for preventing high cholesterol. If cholesterol cannot be managed through diet alone, you may also need to take medicines. ? Take any prescribed medicines to control your cholesterol as told by your health care provider.  Manage your diabetes. ? Eating a healthy diet and exercising regularly are important parts of managing your blood sugar. If your blood sugar cannot be managed through diet and exercise, you may need to take medicines. ? Take any prescribed medicines to control your diabetes as told by your health care provider.  Control your hypertension. ? To reduce your risk of stroke, try to keep your  blood pressure below 130/80. ? Eating a healthy diet and exercising regularly are an important part of controlling your blood pressure. If your blood pressure cannot be managed through diet and exercise, you may need to take medicines. ? Take any prescribed medicines to control hypertension as told by your health care  provider. ? Ask your health care provider if you should monitor your blood pressure at home. ? Have your blood pressure checked every year, even if your blood pressure is normal. Blood pressure increases with age and some medical conditions.  Get evaluated for sleep disorders (sleep apnea). Talk to your health care provider about getting a sleep evaluation if you snore a lot or have excessive sleepiness.  Take over-the-counter and prescription medicines only as told by your health care provider. Aspirin or blood thinners (antiplatelets or anticoagulants) may be recommended to reduce your risk of forming blood clots that can lead to stroke.  Make sure that any other medical conditions you have, such as atrial fibrillation or atherosclerosis, are managed. What are the warning signs of a stroke? The warning signs of a stroke can be easily remembered as BEFAST.  B is for balance. Signs include: ? Dizziness. ? Loss of balance or coordination. ? Sudden trouble walking.  E is for eyes. Signs include: ? A sudden change in vision. ? Trouble seeing.  F is for face. Signs include: ? Sudden weakness or numbness of the face. ? The face or eyelid drooping to one side.  A is for arms. Signs include: ? Sudden weakness or numbness of the arm, usually on one side of the body.  S is for speech. Signs include: ? Trouble speaking (aphasia). ? Trouble understanding.  T is for time. ? These symptoms may represent a serious problem that is an emergency. Do not wait to see if the symptoms will go away. Get medical help right away. Call your local emergency services (911 in the [place] Do not drive yourself to the hospital.  Other signs of stroke may include: ? A sudden, severe headache with no known cause. ? Nausea or vomiting. ? Seizure.  Where to find more information: For more information, visit:  American Stroke Association: www.strokeassociation.org  National Stroke Association:  www.stroke.org  Summary  You can prevent a stroke by eating healthy, exercising, not smoking, limiting alcohol intake, and managing any medical conditions you may have.  Do not use any products that contain nicotine or tobacco, such as cigarettes and e-cigarettes. If you need help quitting, ask your health care provider. It may also be helpful to avoid exposure to secondhand smoke.  Remember BEFAST for warning signs of stroke. Get help right away if you or a loved one has any of these signs. This information is not intended to replace advice given to you by your health care provider. Make sure you discuss any questions you have with your health care provider. Document Released: 07/16/2004 Document Revised: 07/14/2016 Document Reviewed: 07/14/2016 Elsevier Interactive Patient Education  [employer].

## 2017-08-31 NOTE — Progress Notes (Signed)
I agree with the above plan 

## 2017-09-09 DIAGNOSIS — M1A09X0 Idiopathic chronic gout, multiple sites, without tophus (tophi): Secondary | ICD-10-CM | POA: Diagnosis not present

## 2017-09-09 DIAGNOSIS — M2550 Pain in unspecified joint: Secondary | ICD-10-CM | POA: Diagnosis not present

## 2017-09-09 DIAGNOSIS — E663 Overweight: Secondary | ICD-10-CM | POA: Diagnosis not present

## 2017-09-09 DIAGNOSIS — M150 Primary generalized (osteo)arthritis: Secondary | ICD-10-CM | POA: Diagnosis not present

## 2017-09-09 DIAGNOSIS — Z6829 Body mass index (BMI) 29.0-29.9, adult: Secondary | ICD-10-CM | POA: Diagnosis not present

## 2017-09-09 DIAGNOSIS — L400 Psoriasis vulgaris: Secondary | ICD-10-CM | POA: Diagnosis not present

## 2017-10-08 DIAGNOSIS — E109 Type 1 diabetes mellitus without complications: Secondary | ICD-10-CM | POA: Diagnosis not present

## 2017-10-26 DIAGNOSIS — E10319 Type 1 diabetes mellitus with unspecified diabetic retinopathy without macular edema: Secondary | ICD-10-CM | POA: Diagnosis not present

## 2017-10-26 DIAGNOSIS — I739 Peripheral vascular disease, unspecified: Secondary | ICD-10-CM | POA: Diagnosis not present

## 2017-10-26 DIAGNOSIS — Z6829 Body mass index (BMI) 29.0-29.9, adult: Secondary | ICD-10-CM | POA: Diagnosis not present

## 2017-10-26 DIAGNOSIS — Z794 Long term (current) use of insulin: Secondary | ICD-10-CM | POA: Diagnosis not present

## 2017-10-26 DIAGNOSIS — M1000 Idiopathic gout, unspecified site: Secondary | ICD-10-CM | POA: Diagnosis not present

## 2017-10-26 DIAGNOSIS — D126 Benign neoplasm of colon, unspecified: Secondary | ICD-10-CM | POA: Diagnosis not present

## 2017-10-26 DIAGNOSIS — I639 Cerebral infarction, unspecified: Secondary | ICD-10-CM | POA: Diagnosis not present

## 2017-10-26 DIAGNOSIS — E559 Vitamin D deficiency, unspecified: Secondary | ICD-10-CM | POA: Diagnosis not present

## 2017-10-26 DIAGNOSIS — E083393 Diabetes mellitus due to underlying condition with moderate nonproliferative diabetic retinopathy without macular edema, bilateral: Secondary | ICD-10-CM | POA: Diagnosis not present

## 2017-10-26 DIAGNOSIS — E1142 Type 2 diabetes mellitus with diabetic polyneuropathy: Secondary | ICD-10-CM | POA: Diagnosis not present

## 2018-01-18 DIAGNOSIS — N402 Nodular prostate without lower urinary tract symptoms: Secondary | ICD-10-CM | POA: Diagnosis not present

## 2018-01-20 IMAGING — CT CT HEAD W/O CM
3 of 4 series · 17 of 47 positions shown, 20 images · non-contrast
Comparison: None.

CLINICAL DATA: Acute onset left-sided numbness with headache

EXAM:
CT HEAD WITHOUT CONTRAST
TECHNIQUE: Contiguous axial images were obtained from the base of the skull
through the vertex without intravenous contrast.

[Series 201: head w/o, idose (1) · axial · non-contrast · 0.46mm/px · z∈[+85,+215]mm · 11 of 32 slices shown, 14 images]
[im 3/32  brain]
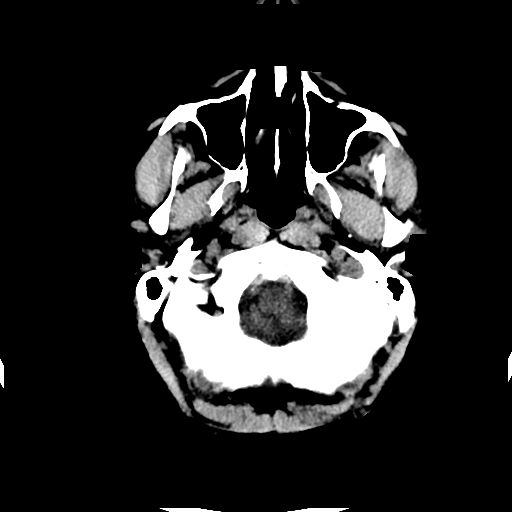
[im 3/32  bone]
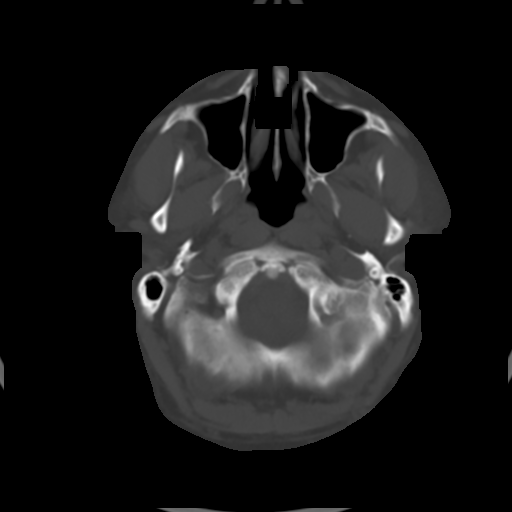
[im 5/32  brain]
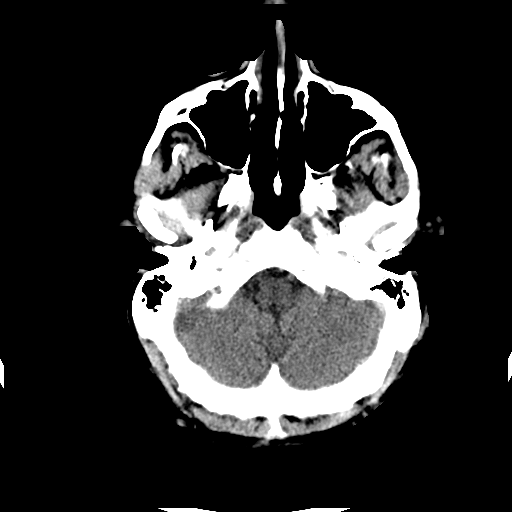
[im 7/32  brain]
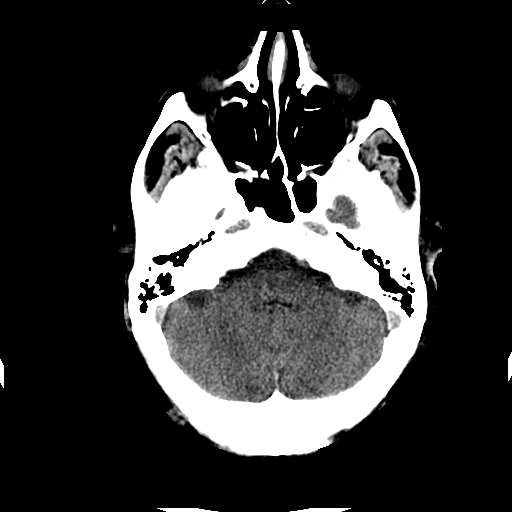
[im 12/32  brain]
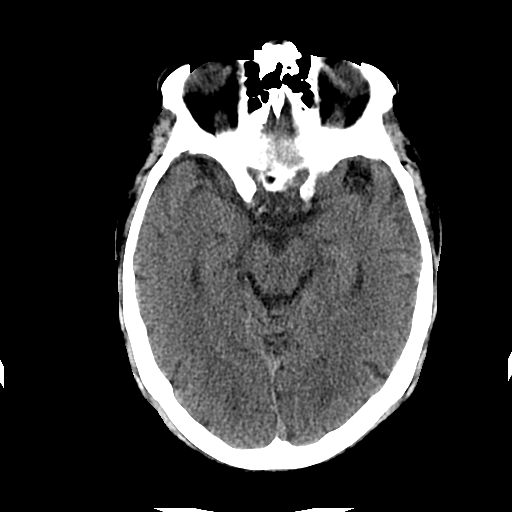
[im 14/32  brain]
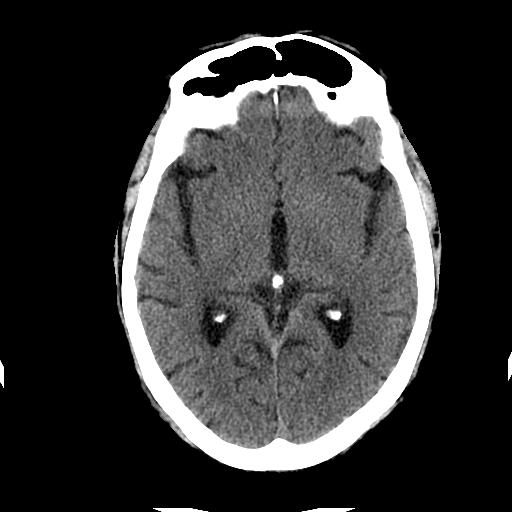
[im 14/32  bone]
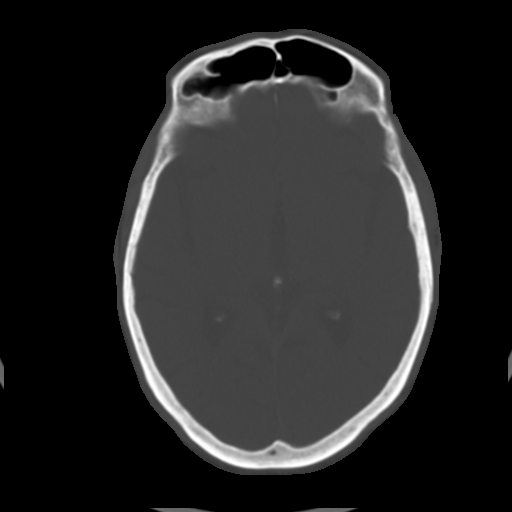
[im 16/32  brain]
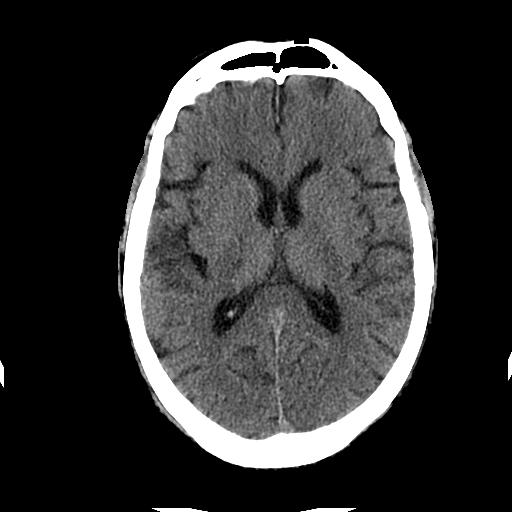
[im 18/32  brain]
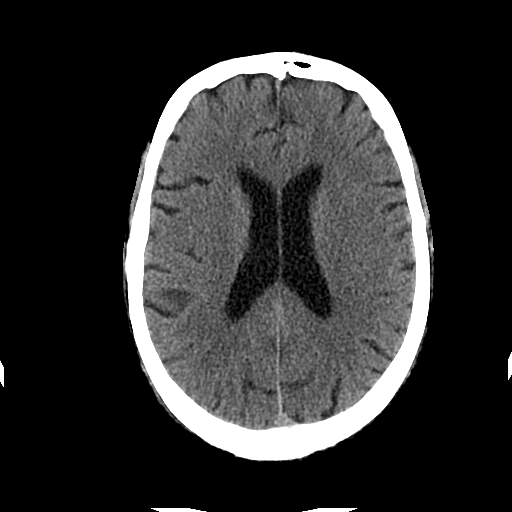
[im 20/32  brain]
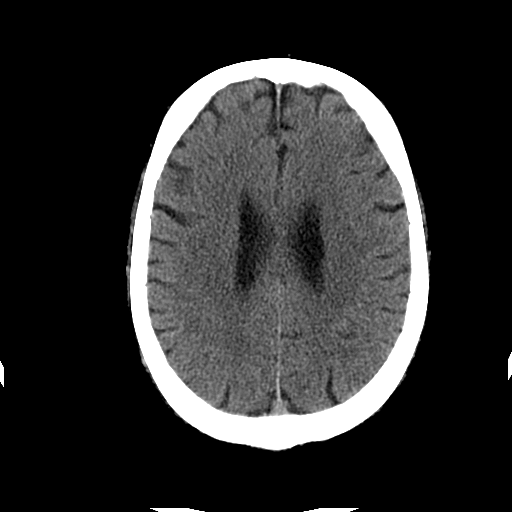
[im 25/32  brain]
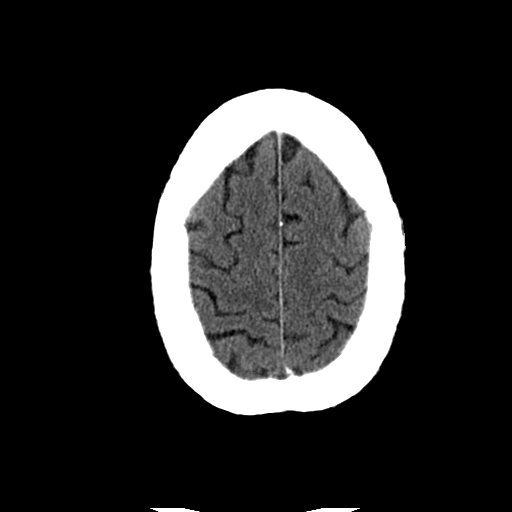
[im 25/32  bone]
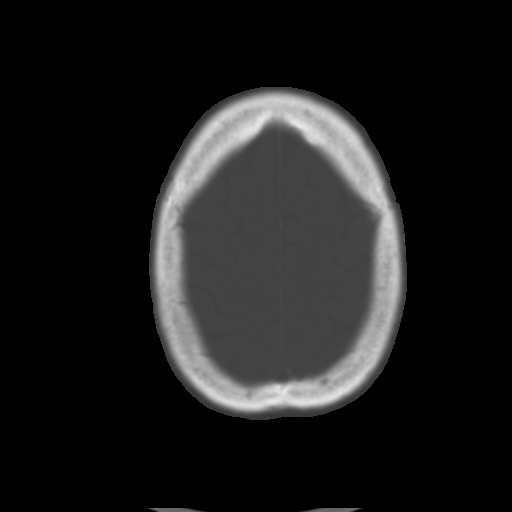
[im 27/32  brain]
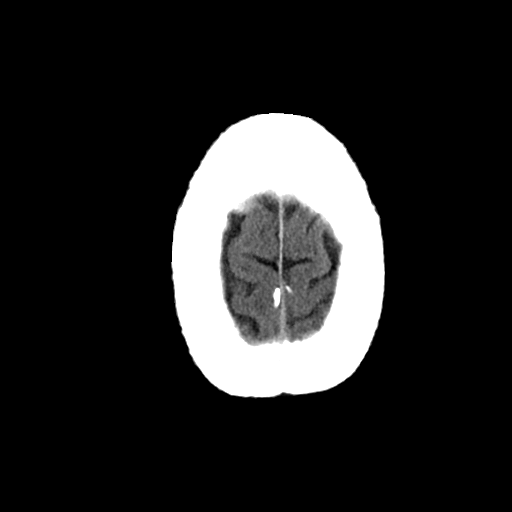
[im 29/32  brain]
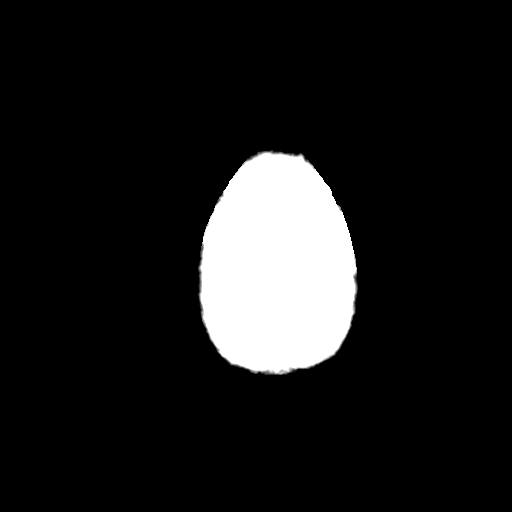

[Series 203: coronal st, idose (1) · coronal · 0.40mm/px · 3 of 72 slices shown]
[im 24/72  brain]
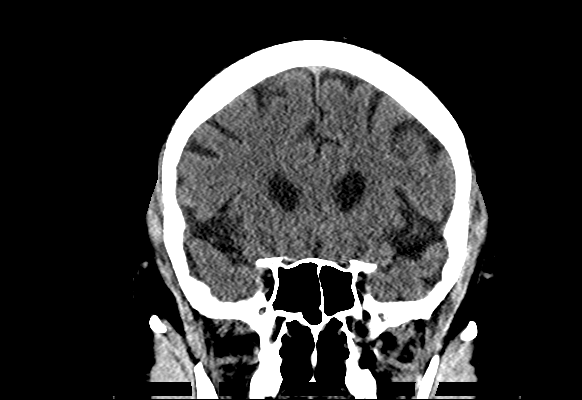
[im 32/72  brain]
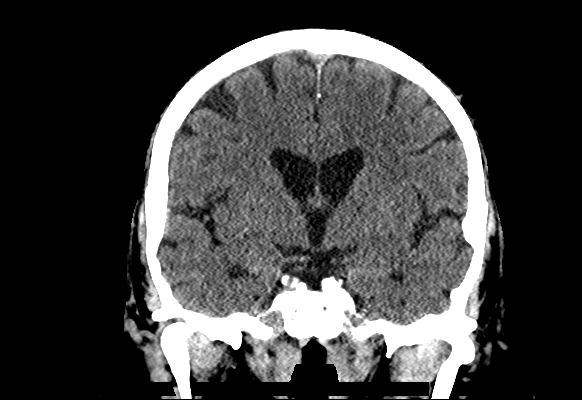
[im 40/72  brain]
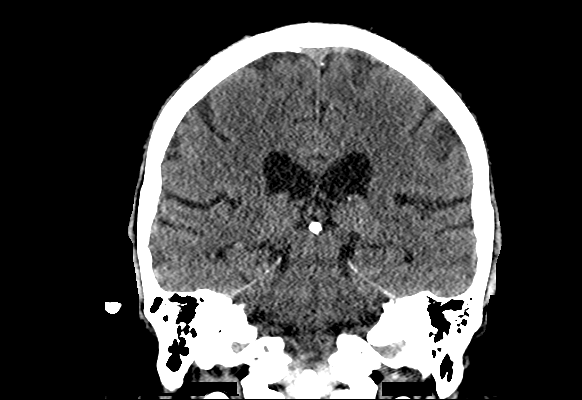

[Series 204: sagittal st, idose (1) · sagittal · 0.40mm/px · 3 of 72 slices shown]
[im 24/72  brain]
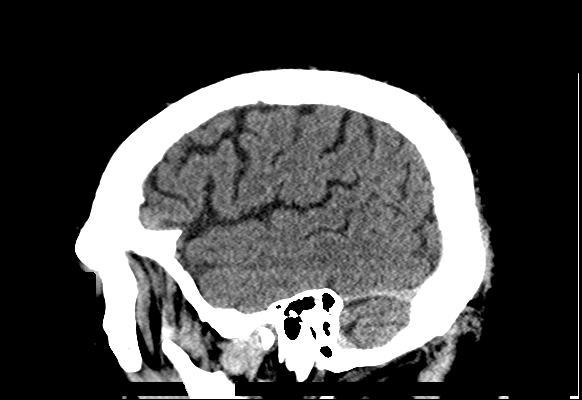
[im 36/72  brain]
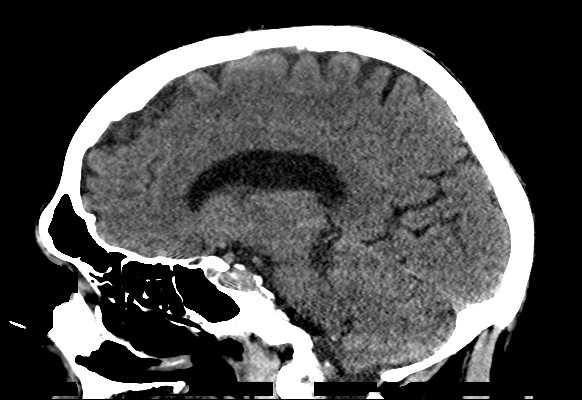
[im 48/72  brain]
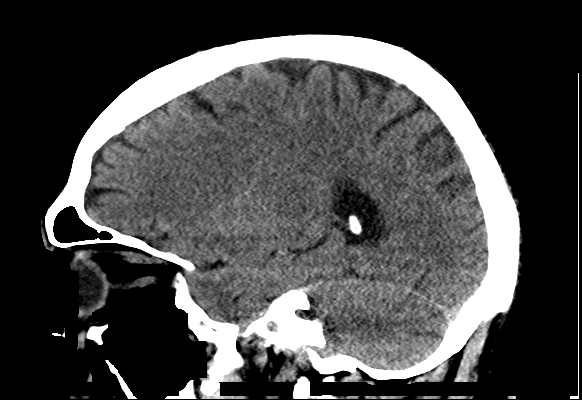

[17 of 47 positions shown; findings below may reference images not displayed]

FINDINGS: Brain: The ventricles are normal in size and configuration. There is
no intracranial mass, hemorrhage, extra-axial fluid collection, or
midline shift. There is minimal small vessel disease in the centra
semiovale bilaterally. Elsewhere gray-white compartments appear
normal. No acute infarct is evident.

Vascular: There is no hyperdense vessel. No appreciable arterial
vascular calcification is evident.

Skull: Bony calvarium appears intact.

Sinuses/Orbits: Visualized paranasal sinuses are clear. No
intraorbital lesions evident.

Other: Mastoid air cells are clear.
IMPRESSION: Minimal periventricular small vessel disease. No intracranial mass,
hemorrhage, or extra-axial fluid collection. No acute appearing
infarct. Study otherwise unremarkable.

## 2018-01-21 DIAGNOSIS — N402 Nodular prostate without lower urinary tract symptoms: Secondary | ICD-10-CM | POA: Diagnosis not present

## 2018-01-21 DIAGNOSIS — N5201 Erectile dysfunction due to arterial insufficiency: Secondary | ICD-10-CM | POA: Diagnosis not present

## 2018-02-07 DIAGNOSIS — E113293 Type 2 diabetes mellitus with mild nonproliferative diabetic retinopathy without macular edema, bilateral: Secondary | ICD-10-CM | POA: Diagnosis not present

## 2018-02-07 DIAGNOSIS — H348312 Tributary (branch) retinal vein occlusion, right eye, stable: Secondary | ICD-10-CM | POA: Diagnosis not present

## 2018-02-07 DIAGNOSIS — H524 Presbyopia: Secondary | ICD-10-CM | POA: Diagnosis not present

## 2018-02-07 DIAGNOSIS — H34211 Partial retinal artery occlusion, right eye: Secondary | ICD-10-CM | POA: Diagnosis not present

## 2018-02-23 DIAGNOSIS — E1142 Type 2 diabetes mellitus with diabetic polyneuropathy: Secondary | ICD-10-CM | POA: Diagnosis not present

## 2018-02-23 DIAGNOSIS — M1000 Idiopathic gout, unspecified site: Secondary | ICD-10-CM | POA: Diagnosis not present

## 2018-02-23 DIAGNOSIS — E10319 Type 1 diabetes mellitus with unspecified diabetic retinopathy without macular edema: Secondary | ICD-10-CM | POA: Diagnosis not present

## 2018-02-23 DIAGNOSIS — N401 Enlarged prostate with lower urinary tract symptoms: Secondary | ICD-10-CM | POA: Diagnosis not present

## 2018-02-23 DIAGNOSIS — Z6828 Body mass index (BMI) 28.0-28.9, adult: Secondary | ICD-10-CM | POA: Diagnosis not present

## 2018-02-23 DIAGNOSIS — M1990 Unspecified osteoarthritis, unspecified site: Secondary | ICD-10-CM | POA: Diagnosis not present

## 2018-02-23 DIAGNOSIS — N08 Glomerular disorders in diseases classified elsewhere: Secondary | ICD-10-CM | POA: Diagnosis not present

## 2018-02-23 DIAGNOSIS — D126 Benign neoplasm of colon, unspecified: Secondary | ICD-10-CM | POA: Diagnosis not present

## 2018-02-23 DIAGNOSIS — E7849 Other hyperlipidemia: Secondary | ICD-10-CM | POA: Diagnosis not present

## 2018-02-23 DIAGNOSIS — E113399 Type 2 diabetes mellitus with moderate nonproliferative diabetic retinopathy without macular edema, unspecified eye: Secondary | ICD-10-CM | POA: Diagnosis not present

## 2018-03-11 DIAGNOSIS — E109 Type 1 diabetes mellitus without complications: Secondary | ICD-10-CM | POA: Diagnosis not present

## 2018-03-29 DIAGNOSIS — C44222 Squamous cell carcinoma of skin of right ear and external auricular canal: Secondary | ICD-10-CM | POA: Diagnosis not present

## 2018-03-29 DIAGNOSIS — D485 Neoplasm of uncertain behavior of skin: Secondary | ICD-10-CM | POA: Diagnosis not present

## 2018-03-29 DIAGNOSIS — Z85828 Personal history of other malignant neoplasm of skin: Secondary | ICD-10-CM | POA: Diagnosis not present

## 2018-04-22 DIAGNOSIS — Z85828 Personal history of other malignant neoplasm of skin: Secondary | ICD-10-CM | POA: Diagnosis not present

## 2018-04-22 DIAGNOSIS — C44222 Squamous cell carcinoma of skin of right ear and external auricular canal: Secondary | ICD-10-CM | POA: Diagnosis not present

## 2018-04-22 DIAGNOSIS — L570 Actinic keratosis: Secondary | ICD-10-CM | POA: Diagnosis not present

## 2018-05-10 DIAGNOSIS — C44222 Squamous cell carcinoma of skin of right ear and external auricular canal: Secondary | ICD-10-CM | POA: Diagnosis not present

## 2018-05-10 DIAGNOSIS — Z85828 Personal history of other malignant neoplasm of skin: Secondary | ICD-10-CM | POA: Diagnosis not present

## 2018-06-20 DIAGNOSIS — E109 Type 1 diabetes mellitus without complications: Secondary | ICD-10-CM | POA: Diagnosis not present

## 2018-07-20 DIAGNOSIS — E0590 Thyrotoxicosis, unspecified without thyrotoxic crisis or storm: Secondary | ICD-10-CM | POA: Diagnosis not present

## 2018-07-20 DIAGNOSIS — M109 Gout, unspecified: Secondary | ICD-10-CM | POA: Diagnosis not present

## 2018-07-20 DIAGNOSIS — E559 Vitamin D deficiency, unspecified: Secondary | ICD-10-CM | POA: Diagnosis not present

## 2018-07-20 DIAGNOSIS — E10319 Type 1 diabetes mellitus with unspecified diabetic retinopathy without macular edema: Secondary | ICD-10-CM | POA: Diagnosis not present

## 2018-07-20 DIAGNOSIS — R82998 Other abnormal findings in urine: Secondary | ICD-10-CM | POA: Diagnosis not present

## 2018-07-25 DIAGNOSIS — D1801 Hemangioma of skin and subcutaneous tissue: Secondary | ICD-10-CM | POA: Diagnosis not present

## 2018-07-25 DIAGNOSIS — L570 Actinic keratosis: Secondary | ICD-10-CM | POA: Diagnosis not present

## 2018-07-25 DIAGNOSIS — L84 Corns and callosities: Secondary | ICD-10-CM | POA: Diagnosis not present

## 2018-07-25 DIAGNOSIS — L218 Other seborrheic dermatitis: Secondary | ICD-10-CM | POA: Diagnosis not present

## 2018-07-25 DIAGNOSIS — L812 Freckles: Secondary | ICD-10-CM | POA: Diagnosis not present

## 2018-07-25 DIAGNOSIS — Z85828 Personal history of other malignant neoplasm of skin: Secondary | ICD-10-CM | POA: Diagnosis not present

## 2018-07-25 DIAGNOSIS — L821 Other seborrheic keratosis: Secondary | ICD-10-CM | POA: Diagnosis not present

## 2018-07-27 DIAGNOSIS — Z794 Long term (current) use of insulin: Secondary | ICD-10-CM | POA: Diagnosis not present

## 2018-07-27 DIAGNOSIS — Z0000 Encounter for general adult medical examination without abnormal findings: Secondary | ICD-10-CM | POA: Diagnosis not present

## 2018-07-27 DIAGNOSIS — E10319 Type 1 diabetes mellitus with unspecified diabetic retinopathy without macular edema: Secondary | ICD-10-CM | POA: Diagnosis not present

## 2018-07-27 DIAGNOSIS — E0590 Thyrotoxicosis, unspecified without thyrotoxic crisis or storm: Secondary | ICD-10-CM | POA: Diagnosis not present

## 2018-07-27 DIAGNOSIS — I6529 Occlusion and stenosis of unspecified carotid artery: Secondary | ICD-10-CM | POA: Diagnosis not present

## 2018-07-27 DIAGNOSIS — E668 Other obesity: Secondary | ICD-10-CM | POA: Diagnosis not present

## 2018-07-27 DIAGNOSIS — M109 Gout, unspecified: Secondary | ICD-10-CM | POA: Diagnosis not present

## 2018-07-27 DIAGNOSIS — K219 Gastro-esophageal reflux disease without esophagitis: Secondary | ICD-10-CM | POA: Diagnosis not present

## 2018-07-27 DIAGNOSIS — E7849 Other hyperlipidemia: Secondary | ICD-10-CM | POA: Diagnosis not present

## 2018-07-27 DIAGNOSIS — N183 Chronic kidney disease, stage 3 (moderate): Secondary | ICD-10-CM | POA: Diagnosis not present

## 2018-08-10 DIAGNOSIS — D3132 Benign neoplasm of left choroid: Secondary | ICD-10-CM | POA: Diagnosis not present

## 2018-08-10 DIAGNOSIS — H34831 Tributary (branch) retinal vein occlusion, right eye, with macular edema: Secondary | ICD-10-CM | POA: Diagnosis not present

## 2018-08-10 DIAGNOSIS — E113293 Type 2 diabetes mellitus with mild nonproliferative diabetic retinopathy without macular edema, bilateral: Secondary | ICD-10-CM | POA: Diagnosis not present

## 2018-08-18 DIAGNOSIS — E10319 Type 1 diabetes mellitus with unspecified diabetic retinopathy without macular edema: Secondary | ICD-10-CM | POA: Diagnosis not present

## 2018-08-18 DIAGNOSIS — Z794 Long term (current) use of insulin: Secondary | ICD-10-CM | POA: Diagnosis not present

## 2018-08-18 DIAGNOSIS — Z4681 Encounter for fitting and adjustment of insulin pump: Secondary | ICD-10-CM | POA: Diagnosis not present

## 2018-08-18 DIAGNOSIS — N183 Chronic kidney disease, stage 3 (moderate): Secondary | ICD-10-CM | POA: Diagnosis not present

## 2018-08-24 ENCOUNTER — Ambulatory Visit (INDEPENDENT_AMBULATORY_CARE_PROVIDER_SITE_OTHER): Payer: Medicare HMO | Admitting: Cardiovascular Disease

## 2018-08-24 ENCOUNTER — Encounter: Payer: Self-pay | Admitting: Cardiovascular Disease

## 2018-08-24 DIAGNOSIS — I10 Essential (primary) hypertension: Secondary | ICD-10-CM

## 2018-08-24 DIAGNOSIS — E782 Mixed hyperlipidemia: Secondary | ICD-10-CM

## 2018-08-24 NOTE — Assessment & Plan Note (Signed)
History of hyperlipidemia on atorvastatin and fenofibrate with lipid profile performed 07/20/2018 revealing total cholesterol 147, LDL of 95 and HDL of 30

## 2018-08-24 NOTE — Assessment & Plan Note (Signed)
History of essential hypertension with blood pressure measured today at 146/76.  He is not on antihypertensive medications.

## 2018-08-24 NOTE — Progress Notes (Signed)
08/24/2018 Randall Horton   05-04-1946  [id number]  Primary Physician Reynold Bowen, MD Primary Cardiologist: Lorretta Harp MD Randall Horton, [place]  HPI:  Randall Horton is a 73 y.o. moderately overweight married Caucasian male father of 1 son, grandfather and one grandchild referred by Dr. Forde Dandy for cardiovascular evaluation because of known risk factors.  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  I last saw him approxi-7 years ago.  He does have a history of treated hypertension, diabetes and hyperlipidemia.  He had a mild stroke in the past but is never had a heart attack.  He has occasional shortness of breath but denies chest pain.  He had a normal Myoview 04/13/2012 and a normal 2D echo 07/30/2016.   Current Meds  Medication Sig  . acetaminophen (TYLENOL) 500 MG tablet Take 1,000 mg by mouth every 6 (six) hours as needed for moderate pain or headache.  . allopurinol (ZYLOPRIM) 300 MG tablet Take 300 mg by mouth every morning.  [name] atorvastatin (LIPITOR) 40 MG tablet Take 1 tablet (40 mg total) by mouth daily at 6 PM.  . clopidogrel (PLAVIX) 75 MG tablet Take 75 mg by mouth daily.  . diphenhydrAMINE (BENADRYL) 25 MG tablet Take 25 mg by mouth at bedtime as needed for itching (around pump site.).  [name] docusate sodium (COLACE) 100 MG capsule Take 100 mg by mouth 2 (two) times daily as needed for mild constipation.  . fenofibrate 160 MG tablet Take 160 mg by mouth every evening.  . gabapentin (NEURONTIN) 300 MG capsule TAKE ONE CAPSULE BY MOUTH three times a day  . hydrocortisone 1 % ointment Apply topically daily. APPLY TO EARS   . Insulin Human (INSULIN PUMP) SOLN Inject 110-120 each into the skin daily. Novolog.  . loratadine (CLARITIN) 10 MG tablet Take 10 mg by mouth 2 (two) times daily.   . Multiple Vitamin (MULTIVITAMIN) tablet Take 1 tablet by mouth daily.  . Nutritional Supplements (VITAMIN D PLUS COFACTORS) TABS Take  5,000 each by mouth.  [name] omeprazole (PRILOSEC OTC) 20 MG tablet Take 20 mg by mouth every morning.  . traMADol (ULTRAM) 50 MG tablet Take by mouth every 6 (six) hours as needed.     Allergies  Allergen Reactions  . Aspirin Other (See Comments)    Severe stomach cramps + constipation.   . Niacin And Related Other (See Comments)    "flushing"  . Penicillins Hives, Itching and Swelling    Has patient had a PCN reaction causing immediate rash, facial/tongue/throat swelling, SOB or lightheadedness with hypotension: YES Has patient had a PCN reaction causing severe rash involving mucus membranes or skin necrosis: NO Has patient had a PCN reaction that required hospitalization NO Has patient had a PCN reaction occurring within the last 10 years: NO If all of the above answers are "NO", then may proceed with Cephalosporin use.    Social History   Socioeconomic History  . Marital status: Married    Spouse name: Not on file  . Number of children: Not on file  . Years of education: Not on file  . Highest education level: Not on file  Occupational History  . Occupation: retired  Scientific laboratory technician  . Financial resource strain: Not on file  . Food insecurity:    Worry: Not on file    Inability: Not on file  . Transportation needs:    Medical: Not  on file    Non-medical: Not on file  Tobacco Use  . Smoking status: Former Smoker    Packs/day: 1.00    Years: 14.00    Pack years: 14.00    Types: Cigarettes    Last attempt to quit: 06/22/1977    Years since quitting: 41.2  . Smokeless tobacco: Current User    Types: Snuff  Substance and Sexual Activity  . Alcohol use: No    Alcohol/week: 0.0 standard drinks  . Drug use: No  . Sexual activity: Not Currently  Lifestyle  . Physical activity:    Days per week: Not on file    Minutes per session: Not on file  . Stress: Not on file  Relationships  . Social connections:    Talks on phone: Not on file    Gets together: Not on file     Attends religious service: Not on file    Active member of club or organization: Not on file    Attends meetings of clubs or organizations: Not on file    Relationship status: Not on file  . Intimate partner violence:    Fear of current or ex partner: Not on file    Emotionally abused: Not on file    Physically abused: Not on file    Forced sexual activity: Not on file  Other Topics Concern  . Not on file  Social History Narrative  . Not on file     Review of Systems: General: negative for chills, fever, night sweats or weight changes.  Cardiovascular: negative for chest pain, dyspnea on exertion, edema, orthopnea, palpitations, paroxysmal nocturnal dyspnea or shortness of breath Dermatological: negative for rash Respiratory: negative for cough or wheezing Urologic: negative for hematuria Abdominal: negative for nausea, vomiting, diarrhea, bright red blood per rectum, melena, or hematemesis Neurologic: negative for visual changes, syncope, or dizziness All other systems reviewed and are otherwise negative except as noted above.    Blood pressure (!) 146/76, pulse 89, height 5' 10.5" (1.791 m), weight 208 lb (94.3 kg).  General appearance: alert and no distress Neck: no adenopathy, no carotid bruit, no JVD, supple, symmetrical, trachea midline and thyroid not enlarged, symmetric, no tenderness/mass/nodules Lungs: clear to auscultation bilaterally Heart: regular rate and rhythm, S1, S2 normal, no murmur, click, rub or gallop Extremities: extremities normal, atraumatic, no cyanosis or edema Pulses: 2+ and symmetric Skin: Skin color, texture, turgor normal. No rashes or lesions Neurologic: Alert and oriented X 3, normal strength and tone. Normal symmetric reflexes. Normal coordination and gait  EKG sinus rhythm at 89 without ST or T wave changes.  I personally reviewed this EKG.  ASSESSMENT AND PLAN:   Essential hypertension History of essential hypertension with blood pressure  measured today at 146/76.  He is not on antihypertensive medications.  Hyperlipidemia History of hyperlipidemia on atorvastatin and fenofibrate with lipid profile performed 07/20/2018 revealing total cholesterol 147, LDL of 95 and HDL of 30      Lorretta Harp MD [place], [facility] 08/24/2018 12:34 PM

## 2018-08-24 NOTE — Patient Instructions (Signed)

## 2018-09-20 DIAGNOSIS — E109 Type 1 diabetes mellitus without complications: Secondary | ICD-10-CM | POA: Diagnosis not present

## 2018-11-23 DIAGNOSIS — E083393 Diabetes mellitus due to underlying condition with moderate nonproliferative diabetic retinopathy without macular edema, bilateral: Secondary | ICD-10-CM | POA: Diagnosis not present

## 2018-11-23 DIAGNOSIS — I6529 Occlusion and stenosis of unspecified carotid artery: Secondary | ICD-10-CM | POA: Diagnosis not present

## 2018-11-23 DIAGNOSIS — Z4681 Encounter for fitting and adjustment of insulin pump: Secondary | ICD-10-CM | POA: Diagnosis not present

## 2018-11-23 DIAGNOSIS — E10319 Type 1 diabetes mellitus with unspecified diabetic retinopathy without macular edema: Secondary | ICD-10-CM | POA: Diagnosis not present

## 2018-11-23 DIAGNOSIS — Z794 Long term (current) use of insulin: Secondary | ICD-10-CM | POA: Diagnosis not present

## 2018-11-23 DIAGNOSIS — N183 Chronic kidney disease, stage 3 (moderate): Secondary | ICD-10-CM | POA: Diagnosis not present

## 2018-12-20 DIAGNOSIS — I6529 Occlusion and stenosis of unspecified carotid artery: Secondary | ICD-10-CM | POA: Diagnosis not present

## 2018-12-20 DIAGNOSIS — E559 Vitamin D deficiency, unspecified: Secondary | ICD-10-CM | POA: Diagnosis not present

## 2018-12-20 DIAGNOSIS — I639 Cerebral infarction, unspecified: Secondary | ICD-10-CM | POA: Diagnosis not present

## 2018-12-20 DIAGNOSIS — Z794 Long term (current) use of insulin: Secondary | ICD-10-CM | POA: Diagnosis not present

## 2018-12-20 DIAGNOSIS — E10319 Type 1 diabetes mellitus with unspecified diabetic retinopathy without macular edema: Secondary | ICD-10-CM | POA: Diagnosis not present

## 2018-12-20 DIAGNOSIS — E113399 Type 2 diabetes mellitus with moderate nonproliferative diabetic retinopathy without macular edema, unspecified eye: Secondary | ICD-10-CM | POA: Diagnosis not present

## 2018-12-20 DIAGNOSIS — N183 Chronic kidney disease, stage 3 (moderate): Secondary | ICD-10-CM | POA: Diagnosis not present

## 2018-12-20 DIAGNOSIS — E1142 Type 2 diabetes mellitus with diabetic polyneuropathy: Secondary | ICD-10-CM | POA: Diagnosis not present

## 2018-12-20 DIAGNOSIS — E0590 Thyrotoxicosis, unspecified without thyrotoxic crisis or storm: Secondary | ICD-10-CM | POA: Diagnosis not present

## 2018-12-20 DIAGNOSIS — E785 Hyperlipidemia, unspecified: Secondary | ICD-10-CM | POA: Diagnosis not present

## 2018-12-29 DIAGNOSIS — M7711 Lateral epicondylitis, right elbow: Secondary | ICD-10-CM | POA: Diagnosis not present

## 2018-12-29 DIAGNOSIS — M65341 Trigger finger, right ring finger: Secondary | ICD-10-CM | POA: Diagnosis not present

## 2019-01-18 DIAGNOSIS — E109 Type 1 diabetes mellitus without complications: Secondary | ICD-10-CM | POA: Diagnosis not present

## 2019-01-19 DIAGNOSIS — H35373 Puckering of macula, bilateral: Secondary | ICD-10-CM | POA: Diagnosis not present

## 2019-01-19 DIAGNOSIS — E103293 Type 1 diabetes mellitus with mild nonproliferative diabetic retinopathy without macular edema, bilateral: Secondary | ICD-10-CM | POA: Diagnosis not present

## 2019-01-19 DIAGNOSIS — H34831 Tributary (branch) retinal vein occlusion, right eye, with macular edema: Secondary | ICD-10-CM | POA: Diagnosis not present

## 2019-01-19 DIAGNOSIS — D3132 Benign neoplasm of left choroid: Secondary | ICD-10-CM | POA: Diagnosis not present

## 2019-01-30 DIAGNOSIS — M79641 Pain in right hand: Secondary | ICD-10-CM | POA: Diagnosis not present

## 2019-01-30 DIAGNOSIS — M65829 Other synovitis and tenosynovitis, unspecified upper arm: Secondary | ICD-10-CM | POA: Diagnosis not present

## 2019-01-30 DIAGNOSIS — M65341 Trigger finger, right ring finger: Secondary | ICD-10-CM | POA: Diagnosis not present

## 2019-02-02 DIAGNOSIS — N402 Nodular prostate without lower urinary tract symptoms: Secondary | ICD-10-CM | POA: Diagnosis not present

## 2019-02-09 DIAGNOSIS — R351 Nocturia: Secondary | ICD-10-CM | POA: Diagnosis not present

## 2019-02-09 DIAGNOSIS — N402 Nodular prostate without lower urinary tract symptoms: Secondary | ICD-10-CM | POA: Diagnosis not present

## 2019-02-09 DIAGNOSIS — N401 Enlarged prostate with lower urinary tract symptoms: Secondary | ICD-10-CM | POA: Diagnosis not present

## 2019-02-22 DIAGNOSIS — Z794 Long term (current) use of insulin: Secondary | ICD-10-CM | POA: Diagnosis not present

## 2019-02-22 DIAGNOSIS — L600 Ingrowing nail: Secondary | ICD-10-CM | POA: Diagnosis not present

## 2019-02-22 DIAGNOSIS — E083393 Diabetes mellitus due to underlying condition with moderate nonproliferative diabetic retinopathy without macular edema, bilateral: Secondary | ICD-10-CM | POA: Diagnosis not present

## 2019-02-22 DIAGNOSIS — Z23 Encounter for immunization: Secondary | ICD-10-CM | POA: Diagnosis not present

## 2019-02-22 DIAGNOSIS — Z4681 Encounter for fitting and adjustment of insulin pump: Secondary | ICD-10-CM | POA: Diagnosis not present

## 2019-02-22 DIAGNOSIS — N183 Chronic kidney disease, stage 3 (moderate): Secondary | ICD-10-CM | POA: Diagnosis not present

## 2019-03-01 DIAGNOSIS — M79609 Pain in unspecified limb: Secondary | ICD-10-CM | POA: Diagnosis not present

## 2019-03-01 DIAGNOSIS — M13849 Other specified arthritis, unspecified hand: Secondary | ICD-10-CM | POA: Diagnosis not present

## 2019-03-01 DIAGNOSIS — M79641 Pain in right hand: Secondary | ICD-10-CM | POA: Diagnosis not present

## 2019-03-01 DIAGNOSIS — M65341 Trigger finger, right ring finger: Secondary | ICD-10-CM | POA: Diagnosis not present

## 2019-03-21 ENCOUNTER — Encounter: Payer: Self-pay | Admitting: Podiatry

## 2019-03-21 ENCOUNTER — Ambulatory Visit (INDEPENDENT_AMBULATORY_CARE_PROVIDER_SITE_OTHER): Payer: Medicare HMO | Admitting: Podiatry

## 2019-03-21 ENCOUNTER — Other Ambulatory Visit: Payer: Self-pay

## 2019-03-21 VITALS — BP 132/81 | HR 83 | Resp 16

## 2019-03-21 DIAGNOSIS — L600 Ingrowing nail: Secondary | ICD-10-CM

## 2019-03-21 MED ORDER — NEOMYCIN-POLYMYXIN-HC 1 % OT SOLN: OTIC | 1 refills | Status: DC

## 2019-03-21 NOTE — Patient Instructions (Signed)

## 2019-03-22 ENCOUNTER — Encounter: Payer: Self-pay | Admitting: Podiatry

## 2019-03-22 NOTE — Progress Notes (Signed)
Subjective:  Patient ID: Randall Horton, male    DOB: 11/09/1945,  MRN: [id number] HPI Chief Complaint  Patient presents with  . Toe Pain    Hallux right - medial border, tender x years intermittently, tried cutting it out  . Diabetes    Type 1 - last a1c 6.7    73 y.o. male presents with the above complaint.   ROS: Denies fever chills nausea vomiting muscle aches and pains.  Past Medical History:  Diagnosis Date  . Arthritis    "hands; right hip/knee" (07/30/2016)  . At risk for sleep apnea    STOP-BANG= 6     SENT TO PCP 06-25-2014  . Basal cell carcinoma    "right temple & earlobe; frozen, burned, scraped"  . Chronic cough    FORMER SMOKER  . Chronic kidney disease (CKD), stage III (moderate) ([place])    Archie Endo 07/29/2016  . Environmental allergies   . Essential hypertension    Archie Endo 07/30/2016  . GERD (gastroesophageal reflux disease)   . H/O cardiovascular stress test 2013   "everything fine" per pt  . High triglycerides   . History of diabetes with ketoacidosis    1980'S  . History of gout   . History of hiatal hernia   . Hyperlipidemia    Archie Endo 07/29/2016  . Incomplete emptying of bladder   . Insulin pump in place   . Pain in right hip    injury 2015  . Pneumonia 2008  . Squamous cell cancer of external ear, right   . Stroke [facility])     subacute infarction in the posterior limb of the right internal capsule/notes 07/29/2016 "left hand and arm don't feel right yet;" (07/30/2016)  . Tachycardia   . Tinnitus of both ears    "chronic for years"  . Type I diabetes mellitus ([place]) "dx'd ~ 1995"   uses insulin pump (07/30/2016)  . Urgency of urination   . Wears glasses   . Wears partial dentures    UPPER AND LOWER   Past Surgical History:  Procedure Laterality Date  . CATARACT EXTRACTION W/ INTRAOCULAR LENS  IMPLANT, BILATERAL  2004 & 2005  . CIRCUMCISION  1960's   with Hernia Repair   . COLONOSCOPY    . CYSTOSCOPY WITH URETHRAL DILATATION N/A 11/02/2014   Procedure:  CYSTOSCOPY WITH URETHRAL DILATATION AND  POSSIBLE COOK DILATIION;  Surgeon: Carolan Clines, MD;  Location: [facility];  Service: Urology;  Laterality: N/A;  . ESOPHAGOGASTRODUODENOSCOPY ENDOSCOPY  2009  . EXCISIONAL HEMORRHOIDECTOMY    . EYE SURGERY Right    "for diabetic retinopathy"  . HEMORRHOID BANDING    . INGUINAL HERNIA REPAIR Bilateral late 1950's - 1960s   "got scrotum caught in accident; had to reattach; had to do one side a 2nd time"  . SQUAMOUS CELL CARCINOMA EXCISION Right    "[place]"  . TRANSURETHRAL RESECTION OF PROSTATE N/A 06/28/2014   Procedure: TRANSURETHRAL RESECTION OF THE PROSTATE (TURP);  Surgeon: Ailene Rud, MD;  Location: [facility];  Service: Urology;  Laterality: N/A;    Current Outpatient Medications:  .  acetaminophen (TYLENOL) 500 MG tablet, Take 1,000 mg by mouth every 6 (six) hours as needed for moderate pain or headache., Disp: , Rfl:  .  allopurinol (ZYLOPRIM) 300 MG tablet, Take 300 mg by mouth every morning., Disp: , Rfl:  .  atorvastatin (LIPITOR) 40 MG tablet, Take 1 tablet (40 mg total) by mouth daily at 6 PM., Disp: 30  tablet, Rfl: 2 .  clopidogrel (PLAVIX) 75 MG tablet, Take 75 mg by mouth daily., Disp: , Rfl:  .  clotrimazole (MYCELEX) 10 MG troche, DISSOLVE 1 TABLET UP TO 5 TIMES A DAY, Disp: , Rfl:  .  Continuous Blood Gluc Sensor (FREESTYLE LIBRE 14 DAY SENSOR) MISC, , Disp: , Rfl:  .  diphenhydrAMINE (BENADRYL) 25 MG tablet, Take 25 mg by mouth at bedtime as needed for itching (around pump site.)., Disp: , Rfl:  .  docusate sodium (COLACE) 100 MG capsule, Take 100 mg by mouth 2 (two) times daily as needed for mild constipation., Disp: , Rfl:  .  fenofibrate 160 MG tablet, Take 160 mg by mouth every evening., Disp: , Rfl:  .  gabapentin (NEURONTIN) 300 MG capsule, TAKE ONE CAPSULE BY MOUTH three times a day, Disp: , Rfl: 2 .  hydrocortisone 1 % ointment, Apply topically daily. APPLY TO EARS , Disp: ,  Rfl:  .  Insulin Human (INSULIN PUMP) SOLN, Inject 110-120 each into the skin daily. Novolog., Disp: , Rfl:  .  loratadine (CLARITIN) 10 MG tablet, Take 10 mg by mouth 2 (two) times daily. , Disp: , Rfl:  .  Multiple Vitamin (MULTIVITAMIN) tablet, Take 1 tablet by mouth daily., Disp: , Rfl:  .  NEOMYCIN-POLYMYXIN-HYDROCORTISONE (CORTISPORIN) 1 % SOLN OTIC solution, Apply 1-2 drops to toe BID after soaking, Disp: 10 mL, Rfl: 1 .  NOVOLOG 100 UNIT/ML injection, PER PUMP PATIENT USES 120 UNITS PER DAY DX  250.50, Disp: , Rfl:  .  Nutritional Supplements (VITAMIN D PLUS COFACTORS) TABS, Take 5,000 each by mouth., Disp: , Rfl:  .  omeprazole (PRILOSEC OTC) 20 MG tablet, Take 20 mg by mouth every morning., Disp: , Rfl:  .  traMADol (ULTRAM) 50 MG tablet, Take by mouth every 6 (six) hours as needed., Disp: , Rfl:   Allergies  Allergen Reactions  . Aspirin Other (See Comments)    Severe stomach cramps + constipation.   . Niacin And Related Other (See Comments)    "flushing"  . Penicillins Hives, Itching and Swelling    Has patient had a PCN reaction causing immediate rash, facial/tongue/throat swelling, SOB or lightheadedness with hypotension: YES Has patient had a PCN reaction causing severe rash involving mucus membranes or skin necrosis: NO Has patient had a PCN reaction that required hospitalization NO Has patient had a PCN reaction occurring within the last 10 years: NO If all of the above answers are "NO", then may proceed with Cephalosporin use.   Review of Systems Objective:   Vitals:   03/21/19 0913  BP: 132/81  Pulse: 83  Resp: 16    General: Well developed, nourished, in no acute distress, alert and oriented x3   Dermatological: Skin is warm, dry and supple bilateral. Nails x 10 are well maintained; remaining integument appears unremarkable at this time. There are no open sores, no preulcerative lesions, no rash or signs of infection present.  Ingrown nail hallux right medial  border. Vascular: Dorsalis Pedis artery and Posterior Tibial artery pedal pulses are 2/4 bilateral with immedate capillary fill time. Pedal hair growth present. No varicosities and no lower extremity edema present bilateral.   Neruologic: Grossly intact via light touch bilateral. Vibratory intact via tuning fork bilateral. Protective threshold with Semmes Wienstein monofilament intact to all pedal sites bilateral. Patellar and Achilles deep tendon reflexes 2+ bilateral. No Babinski or clonus noted bilateral.   Musculoskeletal: No gross boney pedal deformities bilateral. No pain, crepitus, or limitation  noted with foot and ankle range of motion bilateral. Muscular strength 5/5 in all groups tested bilateral.  Gait: Unassisted, Nonantalgic.    Radiographs:  None taken  Assessment & Plan:   Assessment: Ingrown toenail hallux right.  Plan: Chemical matricectomy was performed today after local anesthesia was administered he tolerated the procedure well.  Received both oral and written home-going instructions for the care and soaking of the toe as well as a prescription for Corticosporin otic to be applied twice daily.  I will follow-up with him in 2 weeks.     Evyn Kooyman T. [place], [place]

## 2019-04-04 ENCOUNTER — Encounter: Payer: Self-pay | Admitting: Podiatry

## 2019-04-04 ENCOUNTER — Other Ambulatory Visit: Payer: Self-pay

## 2019-04-04 ENCOUNTER — Ambulatory Visit (INDEPENDENT_AMBULATORY_CARE_PROVIDER_SITE_OTHER): Payer: Self-pay | Admitting: Podiatry

## 2019-04-04 DIAGNOSIS — Z9889 Other specified postprocedural states: Secondary | ICD-10-CM

## 2019-04-04 DIAGNOSIS — L600 Ingrowing nail: Secondary | ICD-10-CM

## 2019-04-04 NOTE — Patient Instructions (Signed)

## 2019-04-05 NOTE — Progress Notes (Signed)
He presents today for follow-up of his matrixectomy hallux right states that it has has not hurt me at all doing much better.  Objective: Vital signs are stable alert and oriented x3.  There is no erythema edema cellulitis drainage odor appears to be healing very nicely.  Assessment: Well-healing surgical foot.  Plan: Continue to soak Epson salts and warm water for the next couple weeks at least daily covering the daytime leave open at bedtime follow-up with me as needed.

## 2019-04-06 DIAGNOSIS — E785 Hyperlipidemia, unspecified: Secondary | ICD-10-CM | POA: Diagnosis not present

## 2019-04-06 DIAGNOSIS — N1831 Chronic kidney disease, stage 3a: Secondary | ICD-10-CM | POA: Diagnosis not present

## 2019-04-06 DIAGNOSIS — E10319 Type 1 diabetes mellitus with unspecified diabetic retinopathy without macular edema: Secondary | ICD-10-CM | POA: Diagnosis not present

## 2019-04-06 DIAGNOSIS — I739 Peripheral vascular disease, unspecified: Secondary | ICD-10-CM | POA: Diagnosis not present

## 2019-04-06 DIAGNOSIS — I129 Hypertensive chronic kidney disease with stage 1 through stage 4 chronic kidney disease, or unspecified chronic kidney disease: Secondary | ICD-10-CM | POA: Diagnosis not present

## 2019-04-06 DIAGNOSIS — I639 Cerebral infarction, unspecified: Secondary | ICD-10-CM | POA: Diagnosis not present

## 2019-04-06 DIAGNOSIS — I6529 Occlusion and stenosis of unspecified carotid artery: Secondary | ICD-10-CM | POA: Diagnosis not present

## 2019-04-06 DIAGNOSIS — M109 Gout, unspecified: Secondary | ICD-10-CM | POA: Diagnosis not present

## 2019-04-06 DIAGNOSIS — D126 Benign neoplasm of colon, unspecified: Secondary | ICD-10-CM | POA: Diagnosis not present

## 2019-04-06 DIAGNOSIS — E1042 Type 1 diabetes mellitus with diabetic polyneuropathy: Secondary | ICD-10-CM | POA: Diagnosis not present

## 2019-05-24 DIAGNOSIS — E083393 Diabetes mellitus due to underlying condition with moderate nonproliferative diabetic retinopathy without macular edema, bilateral: Secondary | ICD-10-CM | POA: Diagnosis not present

## 2019-05-24 DIAGNOSIS — N1831 Chronic kidney disease, stage 3a: Secondary | ICD-10-CM | POA: Diagnosis not present

## 2019-05-24 DIAGNOSIS — Z794 Long term (current) use of insulin: Secondary | ICD-10-CM | POA: Diagnosis not present

## 2019-05-24 DIAGNOSIS — Z4681 Encounter for fitting and adjustment of insulin pump: Secondary | ICD-10-CM | POA: Diagnosis not present

## 2019-05-24 DIAGNOSIS — I129 Hypertensive chronic kidney disease with stage 1 through stage 4 chronic kidney disease, or unspecified chronic kidney disease: Secondary | ICD-10-CM | POA: Diagnosis not present

## 2019-06-14 DIAGNOSIS — E109 Type 1 diabetes mellitus without complications: Secondary | ICD-10-CM | POA: Diagnosis not present

## 2019-06-21 DIAGNOSIS — E109 Type 1 diabetes mellitus without complications: Secondary | ICD-10-CM | POA: Diagnosis not present

## 2019-07-03 DIAGNOSIS — N1831 Chronic kidney disease, stage 3a: Secondary | ICD-10-CM | POA: Diagnosis not present

## 2019-07-03 DIAGNOSIS — Z4681 Encounter for fitting and adjustment of insulin pump: Secondary | ICD-10-CM | POA: Diagnosis not present

## 2019-07-03 DIAGNOSIS — Z794 Long term (current) use of insulin: Secondary | ICD-10-CM | POA: Diagnosis not present

## 2019-07-03 DIAGNOSIS — E083393 Diabetes mellitus due to underlying condition with moderate nonproliferative diabetic retinopathy without macular edema, bilateral: Secondary | ICD-10-CM | POA: Diagnosis not present

## 2019-07-03 DIAGNOSIS — I129 Hypertensive chronic kidney disease with stage 1 through stage 4 chronic kidney disease, or unspecified chronic kidney disease: Secondary | ICD-10-CM | POA: Diagnosis not present

## 2019-08-29 ENCOUNTER — Encounter: Payer: Self-pay | Admitting: Cardiovascular Disease

## 2019-08-29 ENCOUNTER — Other Ambulatory Visit: Payer: Self-pay

## 2019-08-29 ENCOUNTER — Other Ambulatory Visit: Payer: Self-pay | Admitting: Cardiovascular Disease

## 2019-08-29 ENCOUNTER — Ambulatory Visit (INDEPENDENT_AMBULATORY_CARE_PROVIDER_SITE_OTHER): Payer: Medicare HMO | Admitting: Cardiovascular Disease

## 2019-08-29 VITALS — BP 162/90 | HR 85 | Temp 97.4°F | Resp 21 | Ht 70.5 in | Wt 206.6 lb

## 2019-08-29 DIAGNOSIS — M79606 Pain in leg, unspecified: Secondary | ICD-10-CM

## 2019-08-29 DIAGNOSIS — E782 Mixed hyperlipidemia: Secondary | ICD-10-CM | POA: Diagnosis not present

## 2019-08-29 DIAGNOSIS — I10 Essential (primary) hypertension: Secondary | ICD-10-CM | POA: Diagnosis not present

## 2019-08-29 MED ORDER — AMLODIPINE BESYLATE 5 MG PO TABS: 5.0000 mg | ORAL_TABLET | Freq: Every day | ORAL | 3 refills | Status: DC

## 2019-08-29 NOTE — Patient Instructions (Signed)
Medication Instructions:  Start taking 5mg  Amlodipine Daily  If you need a refill on your cardiac medications before your next appointment, please call your pharmacy.   Lab work: NONE  Testing/Procedures: Your physician has requested that you have an ankle brachial index (ABI). During this test an ultrasound and blood pressure cuff are used to evaluate the arteries that supply the arms and legs with blood. Allow thirty minutes for this exam. There are no restrictions or special instructions.  Follow-Up: At [facility], you and your health needs are our priority.  As part of our continuing mission to provide you with exceptional heart care, we have created designated Provider Care Teams.  These Care Teams include your primary Cardiologist (physician) and Advanced Practice Providers (APPs -  Physician Assistants and Nurse Practitioners) who all work together to provide you with the care you need, when you need it. You may see Dr. Gwenlyn Found or one of the following Advanced Practice Providers on your designated Care Team:    Kerin Ransom, PA-C  [place], [place]  Coletta Memos, [place]  Your physician wants you to follow-up in: 1 month with Pharmacy to discuss your blood pressure log Your physician wants you to follow-up in: 6 months with Dr. Gwenlyn Found. You will receive a reminder letter in the mail two months in advance. If you don't receive a letter, please call our office to schedule the follow-up appointment.   Any Other Special Instructions Will Be Listed Below (If Applicable). Please keep a daily blood pressure log for 30 days and bring it your 1 month appointment with pharmacy to review.

## 2019-08-29 NOTE — Assessment & Plan Note (Signed)
History of essential hypertension with blood pressure measured today in the office at 162/90.  He is not on any antihypertensive medications.  His serum creatinine is 1.1.  And will start him on amlodipine 5 mg a day and will have him keep a blood pressure log for 30 days.  He will see our Pharm.D. back in 4 weeks to review and make appropriate changes.  Given his diabetes he may also benefit from being on an ACE inhibitor.

## 2019-08-29 NOTE — Assessment & Plan Note (Signed)
History of hyperlipidemia statin therapy lipid profile performed 07/28/2019 revealing total cholesterol 139, LDL of 82 and HDL 33.

## 2019-08-29 NOTE — Progress Notes (Signed)
08/29/2019 Randall Horton   02/14/1946  [id number]  Primary Physician Reynold Bowen, MD Primary Cardiologist: Lorretta Harp MD [place], [place]  HPI:  Randall Horton is a 74 y.o.  moderately overweight married Caucasian male father of 1 son, grandfather and one grandchild referred by Dr. Forde Dandy for cardiovascular evaluation because of known risk factors.  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  I last saw him in the office 08/24/2018. He does have a history of treated hypertension, diabetes and hyperlipidemia.  He had a mild stroke in the past but is never had a heart attack.  He has occasional shortness of breath but denies chest pain.  He had a normal Myoview 04/13/2012 and a normal 2D echo 07/30/2016.  Since I saw him a year ago he continues to do well.  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  He does admit to being somewhat deconditioned and complains of some shortness of breath.  Also complains of some right hip and leg pain but denies chest pain.    Current Meds  Medication Sig  . ACCU-CHEK GUIDE test strip USE TO SELF MONITOR BLOOD GLUCOSE UP TO 10 TIMES DAILY (E11.65)  . acetaminophen (TYLENOL) 500 MG tablet Take 1,000 mg by mouth every 6 (six) hours as needed for moderate pain or headache.  . allopurinol (ZYLOPRIM) 300 MG tablet Take 300 mg by mouth every morning.  [name] atorvastatin (LIPITOR) 40 MG tablet Take 1 tablet (40 mg total) by mouth daily at 6 PM.  . clopidogrel (PLAVIX) 75 MG tablet Take 75 mg by mouth daily.  . clotrimazole (MYCELEX) 10 MG troche DISSOLVE 1 TABLET UP TO 5 TIMES A DAY  . Continuous Blood Gluc Sensor (FREESTYLE LIBRE 14 DAY SENSOR) MISC   . diphenhydrAMINE (BENADRYL) 25 MG tablet Take 25 mg by mouth at bedtime as needed for itching (around pump site.).  [name] docusate sodium (COLACE) 100 MG capsule Take 100 mg by mouth 2 (two) times daily as needed for mild constipation.  . fenofibrate 160  MG tablet Take 160 mg by mouth every evening.  . gabapentin (NEURONTIN) 300 MG capsule 300 mg 2 (two) times daily.   . hydrocortisone 1 % ointment Apply topically daily. APPLY TO EARS   . Insulin Human (INSULIN PUMP) SOLN Inject 110-120 each into the skin daily. Novolog.  . loratadine (CLARITIN) 10 MG tablet Take 10 mg by mouth 2 (two) times daily.   . montelukast (SINGULAIR) 10 MG tablet Take 10 mg by mouth daily.  . Multiple Vitamin (MULTIVITAMIN) tablet Take 1 tablet by mouth daily.  [name] NOVOLOG 100 UNIT/ML injection PER PUMP PATIENT USES 120 UNITS PER DAY DX  250.50  . Nutritional Supplements (VITAMIN D PLUS COFACTORS) TABS Take 5,000 each by mouth.  [name] omeprazole (PRILOSEC OTC) 20 MG tablet Take 20 mg by mouth every morning.  . traMADol (ULTRAM) 50 MG tablet Take by mouth every 6 (six) hours as needed.  . [DISCONTINUED] NEOMYCIN-POLYMYXIN-HYDROCORTISONE (CORTISPORIN) 1 % SOLN OTIC solution Apply 1-2 drops to toe BID after soaking     Allergies  Allergen Reactions  . Aspirin Other (See Comments)    Severe stomach cramps + constipation.   . Niacin And Related Other (See Comments)    "flushing"  . Penicillins Hives, Itching and Swelling    Has patient had a PCN reaction causing immediate rash, facial/tongue/throat swelling, SOB or  lightheadedness with hypotension: YES Has patient had a PCN reaction causing severe rash involving mucus membranes or skin necrosis: NO Has patient had a PCN reaction that required hospitalization NO Has patient had a PCN reaction occurring within the last 10 years: NO If all of the above answers are "NO", then may proceed with Cephalosporin use.    Social History   Socioeconomic History  . Marital status: Married    Spouse name: Not on file  . Number of children: Not on file  . Years of education: Not on file  . Highest education level: Not on file  Occupational History  . Occupation: retired  Tobacco Use  . Smoking status: Former Smoker     Packs/day: 1.00    Years: 14.00    Pack years: 14.00    Types: Cigarettes    Quit date: 06/22/1977    Years since quitting: 42.2  . Smokeless tobacco: Current User    Types: Snuff  Substance and Sexual Activity  . Alcohol use: No    Alcohol/week: 0.0 standard drinks  . Drug use: No  . Sexual activity: Not Currently  Other Topics Concern  . Not on file  Social History Narrative  . Not on file   Social Determinants of Health   Financial Resource Strain:   . Difficulty of Paying Living Expenses: Not on file  Food Insecurity:   . Worried About Charity fundraiser in the Last Year: Not on file  . Ran Out of Food in the Last Year: Not on file  Transportation Needs:   . Lack of Transportation (Medical): Not on file  . Lack of Transportation (Non-Medical): Not on file  Physical Activity:   . Days of Exercise per Week: Not on file  . Minutes of Exercise per Session: Not on file  Stress:   . Feeling of Stress : Not on file  Social Connections:   . Frequency of Communication with Friends and Family: Not on file  . Frequency of Social Gatherings with Friends and Family: Not on file  . Attends Religious Services: Not on file  . Active Member of Clubs or Organizations: Not on file  . Attends Archivist Meetings: Not on file  . Marital Status: Not on file  Intimate Partner Violence:   . Fear of Current or Ex-Partner: Not on file  . Emotionally Abused: Not on file  . Physically Abused: Not on file  . Sexually Abused: Not on file     Review of Systems: General: negative for chills, fever, night sweats or weight changes.  Cardiovascular: negative for chest pain, dyspnea on exertion, edema, orthopnea, palpitations, paroxysmal nocturnal dyspnea or shortness of breath Dermatological: negative for rash Respiratory: negative for cough or wheezing Urologic: negative for hematuria Abdominal: negative for nausea, vomiting, diarrhea, bright red blood per rectum, melena, or  hematemesis Neurologic: negative for visual changes, syncope, or dizziness All other systems reviewed and are otherwise negative except as noted above.    Blood pressure (!) 162/90, pulse 85, temperature (!) 97.4 F (36.3 C), resp. rate (!) 21, height 5' 10.5" (1.791 m), weight 206 lb 9.6 oz (93.7 kg), SpO2 97 %.  General appearance: alert and no distress Neck: no adenopathy, no carotid bruit, no JVD, supple, symmetrical, trachea midline and thyroid not enlarged, symmetric, no tenderness/mass/nodules Lungs: clear to auscultation bilaterally Heart: regular rate and rhythm, S1, S2 normal, no murmur, click, rub or gallop Extremities: extremities normal, atraumatic, no cyanosis or edema Pulses: 2+ and symmetric  Skin: Skin color, texture, turgor normal. No rashes or lesions Neurologic: Alert and oriented X 3, normal strength and tone. Normal symmetric reflexes. Normal coordination and gait  EKG sinus rhythm at 82 without ST or T wave changes.  Personally reviewed this EKG.  ASSESSMENT AND PLAN:   Essential hypertension History of essential hypertension with blood pressure measured today in the office at 162/90.  He is not on any antihypertensive medications.  His serum creatinine is 1.1.  And will start him on amlodipine 5 mg a day and will have him keep a blood pressure log for 30 days.  He will see our Pharm.D. back in 4 weeks to review and make appropriate changes.  Given his diabetes he may also benefit from being on an ACE inhibitor.  Hyperlipidemia History of hyperlipidemia statin therapy lipid profile performed 07/28/2019 revealing total cholesterol 139, LDL of 82 and HDL 33.      Lorretta Harp MD [facility], [facility] 08/29/2019 9:53 AM

## 2019-08-30 ENCOUNTER — Ambulatory Visit (HOSPITAL_COMMUNITY)
Admission: RE | Admit: 2019-08-30 | Discharge: 2019-08-30 | Disposition: A | Discharge status: Home / Self Care | Payer: Medicare HMO | Source: Ambulatory Visit | Attending: Cardiovascular Disease | Admitting: Cardiovascular Disease

## 2019-08-30 DIAGNOSIS — M79606 Pain in leg, unspecified: Secondary | ICD-10-CM | POA: Insufficient documentation

## 2019-09-26 ENCOUNTER — Ambulatory Visit (INDEPENDENT_AMBULATORY_CARE_PROVIDER_SITE_OTHER): Payer: Medicare HMO | Admitting: Pharmacist Clinician (PhC)/ Clinical Pharmacy Specialist

## 2019-09-26 ENCOUNTER — Other Ambulatory Visit: Payer: Self-pay

## 2019-09-26 DIAGNOSIS — I10 Essential (primary) hypertension: Secondary | ICD-10-CM

## 2019-09-26 NOTE — Progress Notes (Signed)
09/27/2019 Randall Horton [birth year] [id number]   HPI:  Randall Horton is a 74 y.o. male patient of Dr Gwenlyn Found, with a PMH below who presents today for hypertension clinic evaluation.  He was seen by Dr. Gwenlyn Found on March 9 and found to have a BP of 162/90.  At that time he was not on any antihypertensive medications.  Dr. Gwenlyn Found started him on amlodipine 5 mg daily and asked that he return in a month, with home readings, for a follow up .    Today he returns with a list of home BP readings.  States he is doing well with the amlodipine although he feels as if he tires more easily these days.  He stays active, working in his wood shop and yard, and has recently started playing the guitar again.    Chart indicates CKD stage 3, however most recent CrCl is 76.9  Past Medical History: IDDM 2/21: A1c 6.4, on Novolog pump  hyperlipidemia 2/21:  TC 139, TG 118, HDL 33, LDL 82 - on atorvastatin 40 mg  stroke Feb 2018 - subacute infarct, no residual deficits     Blood Pressure Goal:  130/80  Current Medications: amlodipine 5 mg qd  Family Hx: mother with obesity, died from heart disease in her 20s; father non-hodgkins lymphoma; one brother deceased from equine encephalitis; one son no health issues  Social Hx: chewing tobacco daily, quit smoking 79; no alcohol; coffee in the morning (home brewed); some Coke 0 (half bottle per day)  Diet: wife likes to cook, eats mostly home cooking; did stop today for BK on way here; wife has strict diet code (meat and 2 veggies, usually one is green), home canned tomatoes, green beans, turnips, greens; snacks consist of Oreo's for low BS; lunch mostly consists of processed meats in sandwiches.    Exercise:  Has recumbent bike that he rides about every other day for 1-2 miles  Home BP readings: home cuff about 76 years old, brand unknown.  Home readings for the 29 days since starting amlodipine show average of 141/74 with range of 125-165/61-90  Intolerances:  aspirin causes cramping, constipation  Labs: 2/21:  Na 142, K 4.2, Glu 83, BUN 18, SCr 1.10, A1c 6.4; TC 139, TG 118, HDL 33, LDL 82  Wt Readings from Last 3 Encounters:  09/26/19 205 lb 6.4 oz (93.2 kg)  08/29/19 206 lb 9.6 oz (93.7 kg)  08/24/18 208 lb (94.3 kg)   BP Readings from Last 3 Encounters:  09/26/19 132/78  08/29/19 (!) 162/90  03/21/19 132/81   Pulse Readings from Last 3 Encounters:  09/26/19 92  08/29/19 85  03/21/19 83    Current Outpatient Medications  Medication Sig Dispense Refill  . ACCU-CHEK GUIDE test strip USE TO SELF MONITOR BLOOD GLUCOSE UP TO 10 TIMES DAILY (E11.65)    . acetaminophen (TYLENOL) 500 MG tablet Take 1,000 mg by mouth every 6 (six) hours as needed for moderate pain or headache.    . allopurinol (ZYLOPRIM) 300 MG tablet Take 300 mg by mouth every morning.    [name] amLODipine (NORVASC) 5 MG tablet Take 1 tablet (5 mg total) by mouth daily. 180 tablet 3  . atorvastatin (LIPITOR) 40 MG tablet Take 1 tablet (40 mg total) by mouth daily at 6 PM. 30 tablet 2  . clopidogrel (PLAVIX) 75 MG tablet Take 75 mg by mouth daily.    . clotrimazole (MYCELEX) 10 MG troche DISSOLVE 1 TABLET UP TO 5 TIMES  A DAY    . Continuous Blood Gluc Sensor (FREESTYLE LIBRE 14 DAY SENSOR) MISC     . diphenhydrAMINE (BENADRYL) 25 MG tablet Take 25 mg by mouth at bedtime as needed for itching (around pump site.).    [name] docusate sodium (COLACE) 100 MG capsule Take 100 mg by mouth 2 (two) times daily as needed for mild constipation.    . fenofibrate 160 MG tablet Take 160 mg by mouth every evening.    . gabapentin (NEURONTIN) 300 MG capsule 300 mg 2 (two) times daily.   2  . hydrocortisone 1 % ointment Apply topically daily. APPLY TO EARS     . Insulin Human (INSULIN PUMP) SOLN Inject 110-120 each into the skin daily. Novolog.    . loratadine (CLARITIN) 10 MG tablet Take 10 mg by mouth 2 (two) times daily.     . montelukast (SINGULAIR) 10 MG tablet Take 10 mg by mouth daily.    .  Multiple Vitamin (MULTIVITAMIN) tablet Take 1 tablet by mouth daily.    [name] NOVOLOG 100 UNIT/ML injection PER PUMP PATIENT USES 120 UNITS PER DAY DX  250.50    . Nutritional Supplements (VITAMIN D PLUS COFACTORS) TABS Take 5,000 each by mouth.    [name] omeprazole (PRILOSEC OTC) 20 MG tablet Take 20 mg by mouth every morning.    . traMADol (ULTRAM) 50 MG tablet Take by mouth every 6 (six) hours as needed.     No current facility-administered medications for this visit.    Allergies  Allergen Reactions  . Aspirin Other (See Comments)    Severe stomach cramps + constipation.   . Niacin And Related Other (See Comments)    "flushing"  . Penicillins Hives, Itching and Swelling    Has patient had a PCN reaction causing immediate rash, facial/tongue/throat swelling, SOB or lightheadedness with hypotension: YES Has patient had a PCN reaction causing severe rash involving mucus membranes or skin necrosis: NO Has patient had a PCN reaction that required hospitalization NO Has patient had a PCN reaction occurring within the last 10 years: NO If all of the above answers are "NO", then may proceed with Cephalosporin use.  . Tamiflu [Oseltamivir Phosphate] Rash    Past Medical History:  Diagnosis Date  . Arthritis    "hands; right hip/knee" (07/30/2016)  . At risk for sleep apnea    STOP-BANG= 6     SENT TO PCP 06-25-2014  . Basal cell carcinoma    "right temple & earlobe; frozen, burned, scraped"  . Chronic cough    FORMER SMOKER  . Chronic kidney disease (CKD), stage III (moderate)    Archie Endo 07/29/2016  . Environmental allergies   . Essential hypertension    Archie Endo 07/30/2016  . GERD (gastroesophageal reflux disease)   . H/O cardiovascular stress test 2013   "everything fine" per pt  . High triglycerides   . History of diabetes with ketoacidosis    1980'S  . History of gout   . History of hiatal hernia   . Hyperlipidemia    Archie Endo 07/29/2016  . Incomplete emptying of bladder   . Insulin pump  in place   . Pain in right hip    injury 2015  . Pneumonia 2008  . Squamous cell cancer of external ear, right   . Stroke [facility])     subacute infarction in the posterior limb of the right internal capsule/notes 07/29/2016 "left hand and arm don't feel right yet;" (07/30/2016)  . Tachycardia   .  Tinnitus of both ears    "chronic for years"  . Type I diabetes mellitus ([place]) "dx'd ~ 1995"   uses insulin pump (07/30/2016)  . Urgency of urination   . Wears glasses   . Wears partial dentures    UPPER AND LOWER    Blood pressure 132/78, pulse 92, height 5' 10.5" (1.791 m), weight 205 lb 6.4 oz (93.2 kg).  Essential hypertension Patient with hypertension, home readings indicate not yet controlled, however in the office today was good at 132/78.  Had discussion about potentially adding ACEI for DM renal protection as well, but will hold off for now.  Asked that he continue with home BP checks 3-4 times each week and return in 6 weeks with his home meter.  Should the readings be accurate, will probably add a low dose lisinopril rather than increasing amlodipine dose.  Patient agreeable with plan.    Tommy Medal PharmD CPP [facility] [address] [place], [place] [address] [phone]

## 2019-09-26 NOTE — Patient Instructions (Signed)
Return for a a follow up appointment in 6 weeks  Check your blood pressure at home at least 3 times per week and keep record of the readings.  Take your BP meds as follows:  Continue with amlodipine 5 mg daily  Bring all of your meds, your BP cuff and your record of home blood pressures to your next appointment.  Exercise as you're able, try to walk approximately 30 minutes per day.  Keep salt intake to a minimum, especially watch canned and prepared boxed foods.  Eat more fresh fruits and vegetables and fewer canned items.  Avoid eating in fast food restaurants.    HOW TO TAKE YOUR BLOOD PRESSURE: . Rest 5 minutes before taking your blood pressure. .  Don't smoke or drink caffeinated beverages for at least 30 minutes before. . Take your blood pressure before (not after) you eat. . Sit comfortably with your back supported and both feet on the floor (don't cross your legs). . Elevate your arm to heart level on a table or a desk. . Use the proper sized cuff. It should fit smoothly and snugly around your bare upper arm. There should be enough room to slip a fingertip under the cuff. The bottom edge of the cuff should be 1 inch above the crease of the elbow. . Ideally, take 3 measurements at one sitting and record the average.

## 2019-09-27 ENCOUNTER — Encounter: Payer: Self-pay | Admitting: Pharmacist Clinician (PhC)/ Clinical Pharmacy Specialist

## 2019-09-27 NOTE — Assessment & Plan Note (Signed)
Patient with hypertension, home readings indicate not yet controlled, however in the office today was good at 132/78.  Had discussion about potentially adding ACEI for DM renal protection as well, but will hold off for now.  Asked that he continue with home BP checks 3-4 times each week and return in 6 weeks with his home meter.  Should the readings be accurate, will probably add a low dose lisinopril rather than increasing amlodipine dose.  Patient agreeable with plan.

## 2019-11-07 ENCOUNTER — Ambulatory Visit: Payer: Medicare HMO

## 2019-11-30 ENCOUNTER — Ambulatory Visit (INDEPENDENT_AMBULATORY_CARE_PROVIDER_SITE_OTHER): Payer: Medicare HMO | Admitting: Pharmacist

## 2019-11-30 ENCOUNTER — Other Ambulatory Visit: Payer: Self-pay

## 2019-11-30 VITALS — BP 128/74 | HR 80 | Ht 71.0 in | Wt 202.4 lb

## 2019-11-30 DIAGNOSIS — I10 Essential (primary) hypertension: Secondary | ICD-10-CM

## 2019-11-30 NOTE — Patient Instructions (Addendum)
Return for a follow up appointment AS NEEDED  Check your blood pressure at home daily (if able) and keep record of the readings.  Take your BP meds as follows: *NO MEDICATION CHANGES TODAY* *DISCUSS adding low dose lisinopril ot ramipril to regimen for kidney protection with Dr Forde Dandy*  Bring all of your meds, your BP cuff and your record of home blood pressures to your next appointment.  Exercise as you're able, try to walk approximately 30 minutes per day.  Keep salt intake to a minimum, especially watch canned and prepared boxed foods.  Eat more fresh fruits and vegetables and fewer canned items.  Avoid eating in fast food restaurants.    HOW TO TAKE YOUR BLOOD PRESSURE: . Rest 5 minutes before taking your blood pressure. .  Don't smoke or drink caffeinated beverages for at least 30 minutes before. . Take your blood pressure before (not after) you eat. . Sit comfortably with your back supported and both feet on the floor (don't cross your legs). . Elevate your arm to heart level on a table or a desk. . Use the proper sized cuff. It should fit smoothly and snugly around your bare upper arm. There should be enough room to slip a fingertip under the cuff. The bottom edge of the cuff should be 1 inch above the crease of the elbow. . Ideally, take 3 measurements at one sitting and record the average.

## 2019-11-30 NOTE — Progress Notes (Signed)
HPI:  Randall Horton is a 74 y.o. male patient of Dr Gwenlyn Found, with a PMH below who presents today for hypertension follow up. Dr. Gwenlyn Found started him on amlodipine 5 mg daily and asked that he return in a month, with home readings, for a follow up. On April/2021 patient was seen at HTN clinic and stated feeling well, but a little bit tired. Also reports back problems for about 1 months. Chart indicates CKD stage 3, however most recent CrCl is 76.9  Past Medical History: IDDM 2/21: A1c 6.4, on Novolog pump  hyperlipidemia 2/21:  TC 139, TG 118, HDL 33, LDL 82 - on atorvastatin 40 mg  stroke Feb 2018 - subacute infarct, no residual deficits     Blood Pressure Goal:  130/80  Current Medications: amlodipine 5 mg qd  Family Hx: mother with obesity, died from heart disease in her 46s; father non-hodgkins lymphoma; one brother deceased from equine encephalitis; one son no health issues  Social Hx: chewing tobacco daily, quit smoking 79; no alcohol; coffee in the morning (home brewed); some Coke 0 (half bottle per day)  Diet: wife likes to cook, eats mostly home cooking; did stop today for BK on way here; wife has strict diet code (meat and 2 veggies, usually one is green), home canned tomatoes, green beans, turnips, greens; snacks consist of Oreo's for low BS; lunch mostly consists of processed meats in sandwiches.    Exercise:  Has recumbent bike that he rides about every other day for 1-2 miles  Home BP readings: home BP cuff accurate when used with proper technique. 24 home readings show range of 125-166/61-81 but patient was using wrong technique.   Intolerances: aspirin causes cramping, constipation  Labs: 2/21:  Na 142, K 4.2, Glu 83, BUN 18, SCr 1.10, A1c 6.4; TC 139, TG 118, HDL 33, LDL 82  Wt Readings from Last 3 Encounters:  11/30/19 202 lb 6.4 oz (91.8 kg)  09/26/19 205 lb 6.4 oz (93.2 kg)  08/29/19 206 lb 9.6 oz (93.7 kg)   BP Readings from Last 3 Encounters:  11/30/19 128/74    09/26/19 132/78  08/29/19 (!) 162/90   Pulse Readings from Last 3 Encounters:  11/30/19 80  09/26/19 92  08/29/19 85    Current Outpatient Medications  Medication Sig Dispense Refill  . ACCU-CHEK GUIDE test strip USE TO SELF MONITOR BLOOD GLUCOSE UP TO 10 TIMES DAILY (E11.65)    . acetaminophen (TYLENOL) 500 MG tablet Take 1,000 mg by mouth every 6 (six) hours as needed for moderate pain or headache.    . allopurinol (ZYLOPRIM) 300 MG tablet Take 300 mg by mouth every other day.    [name] amLODipine (NORVASC) 5 MG tablet Take 1 tablet (5 mg total) by mouth daily. 180 tablet 3  . Ascorbic Acid (VITAMIN C) 500 MG CHEW Chew 1 tablet by mouth daily.    [name] atorvastatin (LIPITOR) 40 MG tablet Take 1 tablet (40 mg total) by mouth daily at 6 PM. 30 tablet 2  . clopidogrel (PLAVIX) 75 MG tablet Take 75 mg by mouth daily.    . Continuous Blood Gluc Sensor (FREESTYLE LIBRE 14 DAY SENSOR) MISC     . diphenhydrAMINE (BENADRYL) 25 MG tablet Take 25 mg by mouth at bedtime as needed for itching (around pump site.).    [name] docusate sodium (COLACE) 100 MG capsule Take 100 mg by mouth 2 (two) times daily as needed for mild constipation.    . fenofibrate  160 MG tablet Take 160 mg by mouth every evening.    . gabapentin (NEURONTIN) 300 MG capsule 300 mg every evening.   2  . hydrocortisone 1 % ointment Apply topically daily. APPLY TO EARS     . Insulin Human (INSULIN PUMP) SOLN Inject 110-120 each into the skin daily. Novolog.    . loratadine (CLARITIN) 10 MG tablet Take 10 mg by mouth 2 (two) times daily.     . montelukast (SINGULAIR) 10 MG tablet Take 10 mg by mouth daily.    . Multiple Vitamin (MULTIVITAMIN) tablet Take 1 tablet by mouth daily.    [name] NOVOLOG 100 UNIT/ML injection PER PUMP PATIENT USES 120 UNITS PER DAY DX  250.50    . traMADol (ULTRAM) 50 MG tablet Take by mouth every 6 (six) hours as needed.    . Vitamin D, Cholecalciferol, 50 MCG (2000 UT) CAPS Take 1 capsule by mouth daily.    .  clotrimazole (MYCELEX) 10 MG troche DISSOLVE 1 TABLET UP TO 5 TIMES A DAY (Patient not taking: Reported on 11/30/2019)    . omeprazole (PRILOSEC OTC) 20 MG tablet Take 20 mg by mouth every morning.     No current facility-administered medications for this visit.    Allergies  Allergen Reactions  . Aspirin Other (See Comments)    Severe stomach cramps + constipation.   . Niacin And Related Other (See Comments)    "flushing"  . Penicillins Hives, Itching and Swelling    Has patient had a PCN reaction causing immediate rash, facial/tongue/throat swelling, SOB or lightheadedness with hypotension: YES Has patient had a PCN reaction causing severe rash involving mucus membranes or skin necrosis: NO Has patient had a PCN reaction that required hospitalization NO Has patient had a PCN reaction occurring within the last 10 years: NO If all of the above answers are "NO", then may proceed with Cephalosporin use.  . Tamiflu [Oseltamivir Phosphate] Rash    Past Medical History:  Diagnosis Date  . Arthritis    "hands; right hip/knee" (07/30/2016)  . At risk for sleep apnea    STOP-BANG= 6     SENT TO PCP 06-25-2014  . Basal cell carcinoma    "right temple & earlobe; frozen, burned, scraped"  . Chronic cough    FORMER SMOKER  . Chronic kidney disease (CKD), stage III (moderate)    Archie Endo 07/29/2016  . Environmental allergies   . Essential hypertension    Archie Endo 07/30/2016  . GERD (gastroesophageal reflux disease)   . H/O cardiovascular stress test 2013   "everything fine" per pt  . High triglycerides   . History of diabetes with ketoacidosis    1980'S  . History of gout   . History of hiatal hernia   . Hyperlipidemia    Archie Endo 07/29/2016  . Incomplete emptying of bladder   . Insulin pump in place   . Pain in right hip    injury 2015  . Pneumonia 2008  . Squamous cell cancer of external ear, right   . Stroke [facility])     subacute infarction in the posterior limb of the right internal  capsule/notes 07/29/2016 "left hand and arm don't feel right yet;" (07/30/2016)  . Tachycardia   . Tinnitus of both ears    "chronic for years"  . Type I diabetes mellitus ([place]) "dx'd ~ 1995"   uses insulin pump (07/30/2016)  . Urgency of urination   . Wears glasses   . Wears partial dentures  UPPER AND LOWER    Blood pressure 128/74, pulse 80, height 5\' 11"  (1.803 m), weight 202 lb 6.4 oz (91.8 kg), SpO2 97 %.  Essential hypertension Blood pressure at goal today and well controlled at home. Patient will benefit for ACEI renal protection d/t history of DM and HTN. He will l,ike to discussed los dose ramipril or lisinopril with PCP during next visit. Will current all therapy without changes, follow up as needed, and send copy of this note to Dr Forde Dandy per patient's request.    Harrington Challenger PharmD, BCPS, [place] [address] 12/04/2019 12:09 PM

## 2019-12-04 ENCOUNTER — Encounter: Payer: Self-pay | Admitting: Pharmacist

## 2019-12-04 NOTE — Assessment & Plan Note (Signed)
Blood pressure at goal today and well controlled at home. Patient will benefit for ACEI renal protection d/t history of DM and HTN. He will l,ike to discussed los dose ramipril or lisinopril with PCP during next visit. Will current all therapy without changes, follow up as needed, and send copy of this note to Dr Forde Dandy per patient's request.

## 2020-02-06 ENCOUNTER — Ambulatory Visit (INDEPENDENT_AMBULATORY_CARE_PROVIDER_SITE_OTHER): Payer: Medicare HMO | Admitting: Podiatry

## 2020-02-06 ENCOUNTER — Other Ambulatory Visit: Payer: Self-pay | Admitting: Podiatry

## 2020-02-06 ENCOUNTER — Ambulatory Visit (INDEPENDENT_AMBULATORY_CARE_PROVIDER_SITE_OTHER): Payer: Medicare HMO

## 2020-02-06 ENCOUNTER — Other Ambulatory Visit: Payer: Self-pay

## 2020-02-06 DIAGNOSIS — M19272 Secondary osteoarthritis, left ankle and foot: Secondary | ICD-10-CM | POA: Diagnosis not present

## 2020-02-06 DIAGNOSIS — M79671 Pain in right foot: Secondary | ICD-10-CM

## 2020-02-06 DIAGNOSIS — M722 Plantar fascial fibromatosis: Secondary | ICD-10-CM | POA: Diagnosis not present

## 2020-02-06 DIAGNOSIS — M25572 Pain in left ankle and joints of left foot: Secondary | ICD-10-CM

## 2020-02-06 DIAGNOSIS — M7752 Other enthesopathy of left foot: Secondary | ICD-10-CM

## 2020-02-06 NOTE — Patient Instructions (Signed)

## 2020-02-07 NOTE — Progress Notes (Signed)
  Subjective:  Patient ID: Randall Horton, male    DOB: [birth year],  MRN: [id number]  Chief Complaint  Patient presents with  . Ankle Pain    L anterior ankle. Pt stated, "I've had pain for awhile. It comes and goes - mainly affecting me when I walk down stairs or step on uneven ground. The pain can be 8-9/10. I don't have the same range of motion. I'm trying to be more active".  . Foot Pain    R foot, bottom of heel. Pt stated, "I've had a heel spur for years. Orthotics were made for me in the 1980s. Pain comes and goes".  . Diabetes    Diabetic - Type 1 ("since the 1990s"). Pt stated, "I think I have neuropathy - I have numbness and hot feet". No history of ulcers.    74 y.o. male presents with the above complaint. History confirmed with patient.   Objective:  Physical Exam: warm, good capillary refill, no trophic changes or ulcerative lesions, normal DP and PT pulses and normal sensory exam. Left Foot: Pain on palpation to the anterior joint line laterally along the ankle, he has pain with range of motion especially at end range dorsiflexion, mild pain in the sinus tarsi and with subtalar joint range of motion Right Foot: point tenderness over the heel pad   Radiographs: X-ray of right foot and left ankle: Mild joint space narrowing and anterior left ankle, the right side there is a large plantar calcaneal enthesophyte Assessment:   1. Left ankle pain, unspecified chronicity   2. Right foot pain   3. Plantar fasciitis   4. Other secondary osteoarthritis of left ankle      Plan:  Patient was evaluated and treated and all questions answered.  -XR reviewed with patient -Discussed etiology and treatment options.  Recommended injection with corticosteroid today, this is been helpful for him before on the right ankle -Left ankle was injected as below  Procedure: Injection Intermediate Joint Consent: Verbal consent obtained. Location: Left ankle joint  Skin Prep: Betadine and  alcohol. Injectate: 1 cc of dexamethasone phosphate, 1 cc 2% Xylocaine plain Disposition: Patient tolerated procedure well. Injection site dressed with a band-aid.   -XR reviewed with patient -Educated patient on stretching and icing of the affected limb -He discerned wearing his old custom orthotics from the 1980s which is been helpful for him in the past.  He would like to continue wearing these to see if this resolves his pain I think this is reasonable.  Continue stretching exercises.  Return in about 8 weeks (around 04/02/2020) for recheck plantar fasciitis R, left ankle pain.

## 2020-03-20 ENCOUNTER — Other Ambulatory Visit: Payer: Self-pay

## 2020-03-20 ENCOUNTER — Ambulatory Visit (INDEPENDENT_AMBULATORY_CARE_PROVIDER_SITE_OTHER): Payer: Medicare HMO | Admitting: Cardiovascular Disease

## 2020-03-20 ENCOUNTER — Encounter: Payer: Self-pay | Admitting: Cardiovascular Disease

## 2020-03-20 VITALS — BP 144/76 | HR 92 | Ht 71.0 in | Wt 202.0 lb

## 2020-03-20 DIAGNOSIS — E782 Mixed hyperlipidemia: Secondary | ICD-10-CM

## 2020-03-20 DIAGNOSIS — I10 Essential (primary) hypertension: Secondary | ICD-10-CM | POA: Diagnosis not present

## 2020-03-20 NOTE — Progress Notes (Signed)
03/20/2020 Randall Horton   03/05/1946  [id number]  Primary Physician Randall Bowen, MD Primary Cardiologist: Randall Harp MD Randall Horton, [place]  HPI:  Randall Horton is a 74 y.o.  moderately overweight married Caucasian male father of 1 son, grandfather and one grandchild referred by Dr. Forde Horton for cardiovascular evaluation because of known risk factors.  I last saw him in the office 08/26/2019. 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 I last saw him in the office 08/24/2018.He does have a history of treated hypertension, diabetes and hyperlipidemia. He had a mild stroke in the past but is never had a heart attack. He has occasional shortness of breath but denies chest pain. He had a normal Myoview 04/13/2012 and a normal 2D echo 07/30/2016.   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  He does admit to being somewhat deconditioned and complains of some shortness of breath.  Also complains of some right hip and leg pain but denies chest pain.  We obtain lower extremity arterial Doppler studies on him 08/30/2019 which were normal.  Since I saw him 6 months ago he continues to have back and leg pain which I think is orthopedic/neurologic.  He is somewhat fatigued but denies chest pain.  His he has an excellent lipid profile.   Current Meds  Medication Sig  . ACCU-CHEK GUIDE test strip USE TO SELF MONITOR BLOOD GLUCOSE UP TO 10 TIMES DAILY (E11.65)  . acetaminophen (TYLENOL) 500 MG tablet Take 1,000 mg by mouth every 6 (six) hours as needed for moderate pain or headache.  . allopurinol (ZYLOPRIM) 300 MG tablet Take 300 mg by mouth every other day.  . Ascorbic Acid (VITAMIN C) 500 MG CHEW Chew 1 tablet by mouth daily.  . clopidogrel (PLAVIX) 75 MG tablet Take 75 mg by mouth daily.  . clotrimazole (MYCELEX) 10 MG troche DISSOLVE 1 TABLET UP TO 5 TIMES A DAY  . Continuous Blood Gluc Sensor (FREESTYLE LIBRE 14 DAY SENSOR) MISC     . diphenhydrAMINE (BENADRYL) 25 MG tablet Take 25 mg by mouth at bedtime as needed for itching (around pump site.).  [name] docusate sodium (COLACE) 100 MG capsule Take 100 mg by mouth 2 (two) times daily as needed for mild constipation.  . fenofibrate 160 MG tablet Take 160 mg by mouth every evening.  . gabapentin (NEURONTIN) 300 MG capsule 300 mg every evening.   . hydrocortisone 1 % ointment Apply topically daily. APPLY TO EARS   . Insulin Human (INSULIN PUMP) SOLN Inject 110-120 each into the skin daily. Novolog.  . loratadine (CLARITIN) 10 MG tablet Take 10 mg by mouth 2 (two) times daily.   . montelukast (SINGULAIR) 10 MG tablet Take 10 mg by mouth daily.  . Multiple Vitamin (MULTIVITAMIN) tablet Take 1 tablet by mouth daily.  [name] NOVOLOG 100 UNIT/ML injection PER PUMP PATIENT USES 120 UNITS PER DAY DX  250.50  . omeprazole (PRILOSEC OTC) 20 MG tablet Take 20 mg by mouth every morning.  . traMADol (ULTRAM) 50 MG tablet Take by mouth every 6 (six) hours as needed.  . Vitamin D, Cholecalciferol, 50 MCG (2000 UT) CAPS Take 1 capsule by mouth daily.     Allergies  Allergen Reactions  . Aspirin Other (See Comments)    Severe stomach cramps + constipation.   . Niacin And Related Other (See Comments)    "flushing"  .  Penicillins Hives, Itching and Swelling    Has patient had a PCN reaction causing immediate rash, facial/tongue/throat swelling, SOB or lightheadedness with hypotension: YES Has patient had a PCN reaction causing severe rash involving mucus membranes or skin necrosis: NO Has patient had a PCN reaction that required hospitalization NO Has patient had a PCN reaction occurring within the last 10 years: NO If all of the above answers are "NO", then may proceed with Cephalosporin use.  . Tamiflu [Oseltamivir Phosphate] Rash    Social History   Socioeconomic History  . Marital status: Married    Spouse name: Not on file  . Number of children: Not on file  . Years of education:  Not on file  . Highest education level: Not on file  Occupational History  . Occupation: retired  Tobacco Use  . Smoking status: Former Smoker    Packs/day: 1.00    Years: 14.00    Pack years: 14.00    Types: Cigarettes    Quit date: 06/22/1977    Years since quitting: 42.7  . Smokeless tobacco: Current User    Types: Snuff  Substance and Sexual Activity  . Alcohol use: No    Alcohol/week: 0.0 standard drinks  . Drug use: No  . Sexual activity: Not Currently  Other Topics Concern  . Not on file  Social History Narrative  . Not on file   Social Determinants of Health   Financial Resource Strain:   . Difficulty of Paying Living Expenses: Not on file  Food Insecurity:   . Worried About Charity fundraiser in the Last Year: Not on file  . Ran Out of Food in the Last Year: Not on file  Transportation Needs:   . Lack of Transportation (Medical): Not on file  . Lack of Transportation (Non-Medical): Not on file  Physical Activity:   . Days of Exercise per Week: Not on file  . Minutes of Exercise per Session: Not on file  Stress:   . Feeling of Stress : Not on file  Social Connections:   . Frequency of Communication with Friends and Family: Not on file  . Frequency of Social Gatherings with Friends and Family: Not on file  . Attends Religious Services: Not on file  . Active Member of Clubs or Organizations: Not on file  . Attends Archivist Meetings: Not on file  . Marital Status: Not on file  Intimate Partner Violence:   . Fear of Current or Ex-Partner: Not on file  . Emotionally Abused: Not on file  . Physically Abused: Not on file  . Sexually Abused: Not on file     Review of Systems: General: negative for chills, fever, night sweats or weight changes.  Cardiovascular: negative for chest pain, dyspnea on exertion, edema, orthopnea, palpitations, paroxysmal nocturnal dyspnea or shortness of breath Dermatological: negative for rash Respiratory: negative for  cough or wheezing Urologic: negative for hematuria Abdominal: negative for nausea, vomiting, diarrhea, bright red blood per rectum, melena, or hematemesis Neurologic: negative for visual changes, syncope, or dizziness All other systems reviewed and are otherwise negative except as noted above.    Blood pressure (!) 144/76, pulse 92, height 5\' 11"  (1.803 m), weight 202 lb (91.6 kg), SpO2 96 %.  General appearance: alert and no distress Neck: no adenopathy, no carotid bruit, no JVD, supple, symmetrical, trachea midline and thyroid not enlarged, symmetric, no tenderness/mass/nodules Lungs: clear to auscultation bilaterally Heart: regular rate and rhythm, S1, S2 normal, no murmur, click,  rub or gallop Extremities: extremities normal, atraumatic, no cyanosis or edema Pulses: 2+ and symmetric Skin: Skin color, texture, turgor normal. No rashes or lesions Neurologic: Alert and oriented X 3, normal strength and tone. Normal symmetric reflexes. Normal coordination and gait  EKG sinus rhythm at 92 without ST or T wave changes.  I personally reviewed this EKG.  ASSESSMENT AND PLAN:   Essential hypertension History of essential hypertension a blood pressure measured today at 144/76.  He is on lisinopril.  Hyperlipidemia History of hyperlipidemia on fenofibrate and atorvastatin with lipid profile performed 07/28/2019 revealing total cholesterol 139, LDL of 82 and HDL of 33.      Randall Harp MD FACP,FACC,FAHA, [facility] 03/20/2020 11:14 AM

## 2020-03-20 NOTE — Assessment & Plan Note (Signed)
History of essential hypertension a blood pressure measured today at 144/76.  He is on lisinopril.

## 2020-03-20 NOTE — Assessment & Plan Note (Signed)
History of hyperlipidemia on fenofibrate and atorvastatin with lipid profile performed 07/28/2019 revealing total cholesterol 139, LDL of 82 and HDL of 33.

## 2020-03-20 NOTE — Patient Instructions (Signed)

## 2020-04-04 ENCOUNTER — Other Ambulatory Visit: Payer: Self-pay

## 2020-04-04 ENCOUNTER — Ambulatory Visit (INDEPENDENT_AMBULATORY_CARE_PROVIDER_SITE_OTHER): Payer: Medicare HMO | Admitting: Podiatry

## 2020-04-04 DIAGNOSIS — M216X2 Other acquired deformities of left foot: Secondary | ICD-10-CM

## 2020-04-04 DIAGNOSIS — G8929 Other chronic pain: Secondary | ICD-10-CM | POA: Diagnosis not present

## 2020-04-04 DIAGNOSIS — M545 Low back pain, unspecified: Secondary | ICD-10-CM | POA: Diagnosis not present

## 2020-04-04 DIAGNOSIS — M25872 Other specified joint disorders, left ankle and foot: Secondary | ICD-10-CM

## 2020-04-04 NOTE — Progress Notes (Signed)
  Subjective:  Patient ID: Randall Horton, male    DOB: April 26, 1946,  MRN: [id number]  Chief Complaint  Patient presents with  . Foot Pain    Pt stated that he is still having trouble with his left ankle mainly when he is trying to strech or if he pulls back on his toes     74 y.o. male presents with the above complaint. History confirmed with patient.  Pain in the right heel has subsided.  He mostly notices now that the left ankle feels very tight feels like he can pull it down when he tries to pull the toe down.    Objective:  Physical Exam: warm, good capillary refill, no trophic changes or ulcerative lesions, normal DP and PT pulses and normal sensory exam. Left Foot: Less pain today along the anterior joint line.  He has reduced range of motion in dorsiflexion plantarflexion with significant equinus present Right Foot: point tenderness over the heel pad   Radiographs: X-ray of right foot and left ankle: Mild joint space narrowing and anterior left ankle, the right side there is a large plantar calcaneal enthesophyte Assessment:   1. Acquired equinus deformity of left foot   2. Chronic bilateral low back pain without sciatica   3. Impingement syndrome of left ankle      Plan:  Patient was evaluated and treated and all questions answered.  -Plantar fasciitis in the right side has resolved  -Present side he has tightness in the anterior ankle joint and difficulty plantar flexing the joint as well as the digits.  He has mild pain on palpation to the extensor digitorum longus tendon with resisted dorsiflexion.  I think this is all compensatory overload from extensor substitution from his significant equinus.  I recommend that we begin physical therapy to improve range of motion of ankle joint, treat his equinus as well as tight hamstrings which are contributing to low back pain for him.   No follow-ups on file.

## 2020-06-04 ENCOUNTER — Ambulatory Visit (INDEPENDENT_AMBULATORY_CARE_PROVIDER_SITE_OTHER): Payer: Medicare HMO | Admitting: Podiatry

## 2020-06-04 ENCOUNTER — Other Ambulatory Visit: Payer: Self-pay

## 2020-06-04 DIAGNOSIS — M545 Low back pain, unspecified: Secondary | ICD-10-CM

## 2020-06-04 DIAGNOSIS — G8929 Other chronic pain: Secondary | ICD-10-CM | POA: Diagnosis not present

## 2020-06-04 DIAGNOSIS — M216X2 Other acquired deformities of left foot: Secondary | ICD-10-CM | POA: Diagnosis not present

## 2020-06-04 DIAGNOSIS — M25872 Other specified joint disorders, left ankle and foot: Secondary | ICD-10-CM | POA: Diagnosis not present

## 2020-06-06 ENCOUNTER — Encounter: Payer: Self-pay | Admitting: Podiatry

## 2020-06-06 NOTE — Progress Notes (Signed)
  Subjective:  Patient ID: Randall Horton, male    DOB: 16-Apr-1946,  MRN: [id number]  Chief Complaint  Patient presents with  . Foot Pain    PT stated that he is doing well he has no concerns and no pain.    74 y.o. male returns with the above complaint. History confirmed with patient.  Pain in the right heel has subsided.  Ankle is feeling much better as well  Objective:  Physical Exam: warm, good capillary refill, no trophic changes or ulcerative lesions, normal DP and PT pulses and normal sensory exam. Left Foot: Range of motion of the ankle is improved.  No pain today. Right Foot: point tenderness over the heel pad   Radiographs: X-ray of right foot and left ankle: Mild joint space narrowing and anterior left ankle, the right side there is a large plantar calcaneal enthesophyte Assessment:   No diagnosis found.   Plan:  Patient was evaluated and treated and all questions answered.  -Plantar fasciitis in the right side has resolved  -Range of motion pain and dorsiflexion has improved significantly.  -At this point I will release him and he can return as needed for symptoms   Return if symptoms worsen or fail to improve.

## 2020-09-06 ENCOUNTER — Encounter: Payer: Self-pay | Admitting: Internal Medicine

## 2020-09-10 ENCOUNTER — Ambulatory Visit (INDEPENDENT_AMBULATORY_CARE_PROVIDER_SITE_OTHER): Payer: Medicare HMO | Admitting: Orthopaedic Surgery

## 2020-09-10 ENCOUNTER — Encounter: Payer: Self-pay | Admitting: Orthopaedic Surgery

## 2020-09-10 DIAGNOSIS — M47812 Spondylosis without myelopathy or radiculopathy, cervical region: Secondary | ICD-10-CM

## 2020-09-11 DIAGNOSIS — M47812 Spondylosis without myelopathy or radiculopathy, cervical region: Secondary | ICD-10-CM | POA: Insufficient documentation
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
## 2020-10-29 ENCOUNTER — Ambulatory Visit: Payer: Medicare HMO | Admitting: Orthopaedic Surgery

## 2020-10-29 ENCOUNTER — Ambulatory Visit (INDEPENDENT_AMBULATORY_CARE_PROVIDER_SITE_OTHER): Payer: Medicare HMO

## 2020-10-29 ENCOUNTER — Ambulatory Visit (INDEPENDENT_AMBULATORY_CARE_PROVIDER_SITE_OTHER): Payer: Medicare HMO | Admitting: Orthopaedic Surgery

## 2020-10-29 ENCOUNTER — Encounter: Payer: Self-pay | Admitting: Orthopaedic Surgery

## 2020-10-29 VITALS — BP 125/87 | HR 105 | Ht 71.0 in | Wt 202.0 lb

## 2020-10-29 DIAGNOSIS — M545 Low back pain, unspecified: Secondary | ICD-10-CM

## 2020-10-29 NOTE — Progress Notes (Signed)
Office Visit Note   Patient: Randall Horton           Date of Birth: 10-22-1945           MRN: [id number] Visit Date: 10/29/2020              Requested by: Reynold Bowen, MD [address] [place],  [place] [address] PCP: Reynold Bowen, MD   Assessment & Plan: Visit Diagnoses:  1. Right-sided low back pain without sciatica, unspecified chronicity     Plan: Patient does have some carpal tunnel syndrome is doing quite well after injection by Dr. Fredna Dow.  He does have some foraminal narrowing on his neck but states next doing much better. Patient can return if he develops increasing radicular symptoms. Follow-Up Instructions: Return if symptoms worsen or fail to improve.   Orders:  Orders Placed This Encounter  Procedures  . XR Lumbar Spine 2-3 Views   No orders of the defined types were placed in this encounter.     Procedures: No procedures performed   Clinical Data: No additional findings.   Subjective: Chief Complaint  Patient presents with  . Neck - Follow-up, Pain  . Lower Back - Pain    HPI patient turns 7-week follow-up with ongoing problems with his neck and some right-sided low back pain.  He had recent carpal tunnel injection and states his carpal tunnel is probably 80% plus better.  He notes some pain when wearing a belt at the lumbosacral junction.  He has had some back pain for years it seems to be getting a little bit worse with each year and he is taken tramadol occasionally when it is more severe.  Sometimes he has better days.  Previous cervical spine x-ray showed some foraminal narrowing a C6 on oblique views with some mid cervical spondylosis.  Past history of problems with trigger finger none currently.  He does have stage III kidney disease, diabetes hypertension previous CVA.  Review of Systems all other systems noncontributory updated unchanged from 09/10/2020   Objective: Vital Signs: BP 125/87   Pulse (!) 105   Ht 5\' 11"  (1.803 m)   Wt 202  lb (91.6 kg)   BMI 28.17 kg/m   Physical Exam Constitutional:      Appearance: He is well-developed.  HENT:     Head: Normocephalic and atraumatic.  Eyes:     Pupils: Pupils are equal, round, and reactive to light.  Neck:     Thyroid: No thyromegaly.     Trachea: No tracheal deviation.  Cardiovascular:     Rate and Rhythm: Normal rate.  Pulmonary:     Effort: Pulmonary effort is normal.     Breath sounds: No wheezing.  Abdominal:     General: Bowel sounds are normal.     Palpations: Abdomen is soft.  Skin:    General: Skin is warm and dry.     Capillary Refill: Capillary refill takes less than 2 seconds.  Neurological:     Mental Status: He is alert and oriented to person, place, and time.  Psychiatric:        Behavior: Behavior normal.        Thought Content: Thought content normal.        Judgment: Judgment normal.     Ortho Exam patient has negative Phalen's test on the right today.  Mild thenar atrophy.  Full elbow extension no tenderness over the ulnar nerves minimal brachial plexus tenderness.  Negative Spurling.  No lower  extremity hyperreflexia normal heel toe gait.  Negative straight leg raising 90 degrees.  Specialty Comments:  No specialty comments available.  Imaging: No results found.   PMFS History: Patient Active Problem List   Diagnosis Date Noted  . Spondylosis without myelopathy or radiculopathy, cervical region 09/11/2020  . History of stroke 08/31/2017  . Essential hypertension 07/30/2016  . Hyperlipidemia 07/30/2016  . CKD stage 3 secondary to diabetes ([place]) 07/30/2016  . Cerebrovascular accident (CVA) ([place]) 07/29/2016  . Benign hypertrophy of prostate 06/28/2014  . Type 1 diabetes mellitus ([place]) 05/22/2008  . GERD 05/22/2008  . DYSPHAGIA 05/22/2008   Past Medical History:  Diagnosis Date  . Arthritis    "hands; right hip/knee" (07/30/2016)  . At risk for sleep apnea    STOP-BANG= 6     SENT TO PCP 06-25-2014  . Basal cell carcinoma     "right temple & earlobe; frozen, burned, scraped"  . Chronic cough    FORMER SMOKER  . Chronic kidney disease (CKD), stage III (moderate) ([place])    Archie Endo 07/29/2016  . Environmental allergies   . Essential hypertension    Archie Endo 07/30/2016  . GERD (gastroesophageal reflux disease)   . H/O cardiovascular stress test 2013   "everything fine" per pt  . High triglycerides   . History of diabetes with ketoacidosis    1980'S  . History of gout   . History of hiatal hernia   . Hyperlipidemia    Archie Endo 07/29/2016  . Incomplete emptying of bladder   . Insulin pump in place   . Pain in right hip    injury 2015  . Pneumonia 2008  . Squamous cell cancer of external ear, right   . Stroke [facility])     subacute infarction in the posterior limb of the right internal capsule/notes 07/29/2016 "left hand and arm don't feel right yet;" (07/30/2016)  . Tachycardia   . Tinnitus of both ears    "chronic for years"  . Type I diabetes mellitus ([place]) "dx'd ~ 1995"   uses insulin pump (07/30/2016)  . Urgency of urination   . Wears glasses   . Wears partial dentures    UPPER AND LOWER    Family History  Problem Relation Age of Onset  . Heart disease Mother   . Diabetes Father   . Stroke Father   . Colon cancer Neg Hx     Past Surgical History:  Procedure Laterality Date  . CATARACT EXTRACTION W/ INTRAOCULAR LENS  IMPLANT, BILATERAL  2004 & 2005  . CIRCUMCISION  1960's   with Hernia Repair   . COLONOSCOPY    . CYSTOSCOPY WITH URETHRAL DILATATION N/A 11/02/2014   Procedure: CYSTOSCOPY WITH URETHRAL DILATATION AND  POSSIBLE COOK DILATIION;  Surgeon: Carolan Clines, MD;  Location: [facility];  Service: Urology;  Laterality: N/A;  . ESOPHAGOGASTRODUODENOSCOPY ENDOSCOPY  2009  . EXCISIONAL HEMORRHOIDECTOMY    . EYE SURGERY Right    "for diabetic retinopathy"  . HEMORRHOID BANDING    . INGUINAL HERNIA REPAIR Bilateral late 1950's - 1960s   "got scrotum caught in accident; had to  reattach; had to do one side a 2nd time"  . SQUAMOUS CELL CARCINOMA EXCISION Right    "earlove"  . TRANSURETHRAL RESECTION OF PROSTATE N/A 06/28/2014   Procedure: TRANSURETHRAL RESECTION OF THE PROSTATE (TURP);  Surgeon: Ailene Rud, MD;  [facility]  Service: Urology;  Laterality: N/A;   Social History   Occupational History  .  Occupation: retired  Tobacco Use  . Smoking status: Former Smoker    Packs/day: 1.00    Years: 14.00    Pack years: 14.00    Types: Cigarettes    Quit date: 06/22/1977    Years since quitting: 43.3  . Smokeless tobacco: Current User    Types: Snuff  Substance and Sexual Activity  . Alcohol use: No    Alcohol/week: 0.0 standard drinks  . Drug use: No  . Sexual activity: Not Currently

## 2021-03-12 ENCOUNTER — Ambulatory Visit (INDEPENDENT_AMBULATORY_CARE_PROVIDER_SITE_OTHER): Payer: Medicare HMO | Admitting: Gastroenterology

## 2021-03-12 ENCOUNTER — Encounter: Payer: Self-pay | Admitting: Gastroenterology

## 2021-03-12 VITALS — BP 128/66 | HR 86 | Ht 70.5 in | Wt 200.0 lb

## 2021-03-12 DIAGNOSIS — K56609 Unspecified intestinal obstruction, unspecified as to partial versus complete obstruction: Secondary | ICD-10-CM | POA: Insufficient documentation

## 2021-03-12 NOTE — Progress Notes (Signed)
Assessment and plans noted ?

## 2021-03-12 NOTE — Patient Instructions (Addendum)
If you are age 77 or older, your body mass index should be between 23-30. Your Body mass index is 28.29 kg/m. If this is out of the aforementioned range listed, please consider follow up with your Primary Care Provider.  __________________________________________________________  The [facility] providers would like to encourage you to use [facility] to communicate with providers for non-urgent requests or questions.  Due to long hold times on the telephone, sending your provider a message by [facility] may be a faster and more efficient way to get a response.  Please allow 48 business hours for a response.  Please remember that this is for non-urgent request.  You have been scheduled for a CT scan of the abdomen and pelvis at [facility], 1st floor Radiology. You are scheduled on 04/19/2021  at 11:30 am. You should arrive 1 hour and 45 minutes prior to your appointment time for registration.    Please follow the written instructions below on the day of your exam:   1) Do not eat anything after 7:30 am (4 hours prior to your test)   You may take any medications as prescribed with a small amount of water, if necessary. If you take any of the following medications: METFORMIN, GLUCOPHAGE, GLUCOVANCE, AVANDAMET, RIOMET, FORTAMET, [place] MET, JANUMET, GLUMETZA or METAGLIP, you MAY be asked to HOLD this medication 48 hours AFTER the exam.   The purpose of you drinking the oral contrast is to aid in the visualization of your intestinal tract. The contrast solution may cause some diarrhea. Depending on your individual set of symptoms, you may also receive an intravenous injection of x-ray contrast/dye. Plan on being at [facility] for 45 minutes or longer, depending on the type of exam you are having performed.   If you have any questions regarding your exam or if you need to reschedule, you may call [facility] at [phone] between the hours of 8:00 am and 5:00 pm, Monday-Friday.   Your  provider has requested that you go to the basement level for lab work before leaving today. Press "B" on the elevator. The lab is located at the first door on the left as you exit the elevator.  Start using Benefiber  2 tsp in 8 ounces daily.

## 2021-03-12 NOTE — Progress Notes (Signed)
03/12/2021 Randall Horton [id number] 06-Jun-1946   HISTORY OF PRESENT ILLNESS: This is a 75 year old male who is a patient of Dr. Blanch Media.  He is here today for follow-up of hospital stay at [facility] in June at which time he had a small bowel obstruction.  He tells me that he had a bologna sandwich and then some time later he became very ill.  He started throwing up and says that he is not a person who usually vomits.  He says that a few days later he developed abdominal pain.  He went to the emergency department where CT scan of the abdomen and pelvis without contrast showed small bowel obstruction without discrete transition point identified and no definite evidence of bowel ischemia although it was limited due to the lack of IV contrast.  He received an NG tube and spent about 4 days in the hospital.  This ended up resolving.  He has never had a bowel obstruction in the past.  The only abdominal surgeries he's had was inguinal hernia repairs many years ago.  Of note, his wife had the same infectious process with vomiting, etc. around the same time that he had been sick as well.  He obviously is feeling much better at this point, but feels like his bowel habits have not been the same since then.  He says that he has always been more on the constipated side.  Now he feels that he is not as regular and has some incomplete emptying or evacuation, needing to go back and forth to the bathroom several times before he completely relieves himself.  Last colonoscopy 08/2015 with only one 1 mm polyp, adenoma removed and diverticulosis.  Past Medical History:  Diagnosis Date   Arthritis    "hands; right hip/knee" (07/30/2016)   At risk for sleep apnea    STOP-BANG= 6     SENT TO PCP 06-25-2014   Basal cell carcinoma    "right temple & earlobe; frozen, burned, scraped"   Chronic cough    FORMER SMOKER   Chronic kidney disease (CKD), stage III (moderate) ([facility])    Archie Endo 07/29/2016   Environmental  allergies    Essential hypertension    Archie Endo 07/30/2016   GERD (gastroesophageal reflux disease)    H/O cardiovascular stress test 2013   "everything fine" per pt   High triglycerides    History of diabetes with ketoacidosis    1980'S   History of gout    History of hiatal hernia    Hyperlipidemia    /notes 07/29/2016   Incomplete emptying of bladder    Insulin pump in place    Pain in right hip    injury 2015   Pneumonia 2008   Squamous cell cancer of external ear, right    Stroke ([place])     subacute infarction in the posterior limb of the right internal capsule/notes 07/29/2016 "left hand and arm don't feel right yet;" (07/30/2016)   Tachycardia    Tinnitus of both ears    "chronic for years"   Type I diabetes mellitus ([place]) "dx'd ~ 1995"   uses insulin pump (07/30/2016)   Urgency of urination    Wears glasses    Wears partial dentures    UPPER AND LOWER   Past Surgical History:  Procedure Laterality Date   CATARACT EXTRACTION W/ INTRAOCULAR LENS  IMPLANT, BILATERAL  2004 & 2005   CIRCUMCISION  1960's   with Hernia Repair    COLONOSCOPY  CYSTOSCOPY WITH URETHRAL DILATATION N/A 11/02/2014   Procedure: CYSTOSCOPY WITH URETHRAL DILATATION AND  POSSIBLE COOK DILATIION;  Surgeon: Carolan Clines, MD;  Location: [facility];  Service: Urology;  Laterality: N/A;   ESOPHAGOGASTRODUODENOSCOPY ENDOSCOPY  2009   EXCISIONAL HEMORRHOIDECTOMY     EYE SURGERY Right    "for diabetic retinopathy"   HEMORRHOID BANDING     INGUINAL HERNIA REPAIR Bilateral late 1950's - 1960s   "got scrotum caught in accident; had to reattach; had to do one side a 2nd time"   [place] Right    "earlove"   TRANSURETHRAL RESECTION OF PROSTATE N/A 06/28/2014   Procedure: TRANSURETHRAL RESECTION OF THE PROSTATE (TURP);  Surgeon: Ailene Rud, MD;  Location: [facility];  Service: Urology;  Laterality: N/A;    reports that he quit smoking about 43  years ago. His smoking use included cigarettes. He has a 14.00 pack-year smoking history. His smokeless tobacco use includes snuff. He reports that he does not drink alcohol and does not use drugs. family history includes Diabetes in his father; Heart disease in his mother; Stroke in his father. Allergies  Allergen Reactions   Aspirin Other (See Comments)    Severe stomach cramps + constipation.    Niacin And Related Other (See Comments)    "flushing"   Penicillins Hives, Itching and Swelling    Has patient had a PCN reaction causing immediate rash, facial/tongue/throat swelling, SOB or lightheadedness with hypotension: YES Has patient had a PCN reaction causing severe rash involving mucus membranes or skin necrosis: NO Has patient had a PCN reaction that required hospitalization NO Has patient had a PCN reaction occurring within the last 10 years: NO If all of the above answers are "NO", then may proceed with Cephalosporin use.   Tamiflu [Oseltamivir Phosphate] Rash      Outpatient Encounter Medications as of 03/12/2021  Medication Sig   ACCU-CHEK GUIDE test strip USE TO SELF MONITOR BLOOD GLUCOSE UP TO 10 TIMES DAILY (E11.65)   acetaminophen (TYLENOL) 500 MG tablet Take 1,000 mg by mouth every 6 (six) hours as needed for moderate pain or headache.   allopurinol (ZYLOPRIM) 300 MG tablet Take 300 mg by mouth every other day.   Ascorbic Acid (VITAMIN C) 500 MG CHEW Chew 1 tablet by mouth daily.   clopidogrel (PLAVIX) 75 MG tablet Take 75 mg by mouth daily.   clotrimazole (MYCELEX) 10 MG troche DISSOLVE 1 TABLET UP TO 5 TIMES A DAY   Continuous Blood Gluc Sensor (FREESTYLE LIBRE 14 DAY SENSOR) MISC    diphenhydrAMINE (BENADRYL) 25 MG tablet Take 25 mg by mouth at bedtime as needed for itching (around pump site.).   docusate sodium (COLACE) 100 MG capsule Take 100 mg by mouth 2 (two) times daily as needed for mild constipation.   fenofibrate 160 MG tablet Take 160 mg by mouth every evening.    gabapentin (NEURONTIN) 300 MG capsule 300 mg every evening.    hydrocortisone 1 % ointment Apply topically daily. APPLY TO EARS    Insulin Human (INSULIN PUMP) SOLN Inject 110-120 each into the skin daily. Novolog.   lisinopril (ZESTRIL) 10 MG tablet Take 10 mg by mouth daily.   loratadine (CLARITIN) 10 MG tablet Take 10 mg by mouth 2 (two) times daily.   montelukast (SINGULAIR) 10 MG tablet Take 10 mg by mouth daily.   Multiple Vitamin (MULTIVITAMIN) tablet Take 1 tablet by mouth daily.   NOVOLOG 100 UNIT/ML injection PER  PUMP PATIENT USES 120 UNITS PER DAY DX  250.50   omeprazole (PRILOSEC OTC) 20 MG tablet Take 20 mg by mouth every morning.   traMADol (ULTRAM) 50 MG tablet Take by mouth every 6 (six) hours as needed.   Vitamin D, Cholecalciferol, 50 MCG (2000 UT) CAPS Take 1 capsule by mouth daily.   atorvastatin (LIPITOR) 40 MG tablet Take 1 tablet (40 mg total) by mouth daily at 6 PM.   No facility-administered encounter medications on file as of 03/12/2021.     REVIEW OF SYSTEMS  : All other systems reviewed and negative except where noted in the History of Present Illness.   PHYSICAL EXAM: BP 128/66   Pulse 86   Ht 5' 10.5" (1.791 m)   Wt 200 lb (90.7 kg)   BMI 28.29 kg/m  General: Well developed white male in no acute distress Head: Normocephalic and atraumatic Eyes:  Sclerae anicteric, conjunctiva pink. Ears: Normal auditory acuity Lungs: Clear throughout to auscultation; no W/R/R. Heart: Regular rate and rhythm; no M/R/G. Abdomen: Soft, non-distended.  BS present.  Non-tender. Musculoskeletal: Symmetrical with no gross deformities  Skin: No lesions on visible extremities Extremities: No edema  Neurological: Alert oriented x 4, grossly non-focal Psychological:  Alert and cooperative. Normal mood and affect  ASSESSMENT AND PLAN: *Small bowel obstruction: Had a small bowel obstruction in June.  This is the first that he's ever had.  No history of abdominal surgeries  except for inguinal hernia repairs many many years ago.  He is very concerned about this and very worried about it happening again.  I think this could have been associated with some type of infectious gastroenteritis that he had prior to the development of the bowel obstruction.  His wife had the same infectious process, but then he developed this bowel obstruction following that.  I think we can proceed with a CT enterography to look for any focal point in the small bowel that may make this likely to happen again.  Hopefully this will give him some reassurance.  Says his bowels have not been quite the same since this happened.  Tends to be constipated chronically, but having incomplete emptying/evacuation of stools.  Advised that he try some Benefiber 2 teaspoons mixed in 8 ounces of liquid daily to see if that helps to start.  Will check a BMP for the IV contrast. *Personal history of adenomatous colon polyp:  Had one 1 mm polyp removed in 08/2015.  By new guidelines he can wait until 08/2022.   CC:  Reynold Bowen, MD

## 2021-03-20 ENCOUNTER — Ambulatory Visit (HOSPITAL_COMMUNITY)
Admission: RE | Admit: 2021-03-20 | Discharge: 2021-03-20 | Disposition: A | Discharge status: Home / Self Care | Payer: Medicare HMO | Source: Ambulatory Visit | Attending: Gastroenterology | Admitting: Gastroenterology

## 2021-03-20 ENCOUNTER — Encounter (HOSPITAL_COMMUNITY): Payer: Self-pay

## 2021-03-20 DIAGNOSIS — K56609 Unspecified intestinal obstruction, unspecified as to partial versus complete obstruction: Secondary | ICD-10-CM | POA: Diagnosis not present

## 2021-03-20 LAB — POCT I-STAT CREATININE: Creatinine, Ser: 1.3 mg/dL — ABNORMAL HIGH (ref 0.61–1.24)

## 2021-03-20 MED ORDER — BARIUM SULFATE 0.1 % PO SUSP
ORAL | Status: AC
  Filled 2021-03-20: qty 1

## 2021-03-20 MED ORDER — BARIUM SULFATE 0.1 % PO SUSP
ORAL | Status: AC
  Administered 2021-03-20: 1350 mL
  Filled 2021-03-20: qty 3

## 2021-03-20 MED ORDER — IOHEXOL 350 MG/ML SOLN
100.0000 mL | Freq: Once | INTRAVENOUS | Status: AC | PRN
  Administered 2021-03-20: 100 mL via INTRAVENOUS

## 2021-04-22 DEATH — deceased

## 2021-04-25 ENCOUNTER — Other Ambulatory Visit: Payer: Self-pay | Admitting: Endocrinology

## 2021-04-25 DIAGNOSIS — R911 Solitary pulmonary nodule: Secondary | ICD-10-CM

## 2021-05-21 ENCOUNTER — Other Ambulatory Visit: Payer: Self-pay

## 2021-05-21 ENCOUNTER — Ambulatory Visit
Admission: RE | Admit: 2021-05-21 | Discharge: 2021-05-21 | Disposition: A | Discharge status: Home / Self Care | Payer: Medicare HMO | Source: Ambulatory Visit | Attending: Endocrinology | Admitting: Endocrinology

## 2021-05-21 DIAGNOSIS — R911 Solitary pulmonary nodule: Secondary | ICD-10-CM

## 2021-05-21 MED ORDER — IOPAMIDOL (ISOVUE-300) INJECTION 61%
75.0000 mL | Freq: Once | INTRAVENOUS | Status: AC | PRN
  Administered 2021-05-21: 75 mL via INTRAVENOUS

## 2021-10-22 ENCOUNTER — Ambulatory Visit (INDEPENDENT_AMBULATORY_CARE_PROVIDER_SITE_OTHER): Payer: Medicare HMO | Admitting: Pulmonary Disease

## 2021-10-22 ENCOUNTER — Encounter: Payer: Self-pay | Admitting: Pulmonary Disease

## 2021-10-22 VITALS — BP 110/62 | HR 109 | Temp 98.0°F | Ht 70.0 in | Wt 205.0 lb

## 2021-10-22 DIAGNOSIS — R911 Solitary pulmonary nodule: Secondary | ICD-10-CM | POA: Diagnosis not present

## 2021-10-22 DIAGNOSIS — J849 Interstitial pulmonary disease, unspecified: Secondary | ICD-10-CM

## 2021-10-22 NOTE — Progress Notes (Signed)
? ?      ?Randall Horton    [id number]    [birth year] ? ?Primary Care Physician:Horton, Randall Main, MD ? ?Referring Physician: Reynold Bowen, MD ?[address] ?[name],  [place] [address] ? ?Chief complaint: Consult for abnormal CT scan ? ?HPI: ?76 year old with history of hypertension, type 1 diabetes, hyperlipidemia, stroke, allergies.  He recently had a CT abdomen for work-up of abdominal pain which showed abnormal lungs ?Follow-up high-res CT showed pulmonary fibrosis and lung nodule and has been referred here for further evaluation ? ?He does not have mild dyspnea on exertion with occasional sneezing and cough. ?History notable for GERD, hiatal hernia, exposures as below ? 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  He continues to do woodwork as a hobby and is exposed to moldy wood dust ?ILD questionnaire 10/22/2021-as above ?Exposures: As above ?Smoking history: 20-pack-year smoker.  Quit in 1979 ?Travel history: No significant travel history ?Relevant family history: No family history of lung disease ? ?Outpatient Encounter Medications as of 10/22/2021  ?Medication Sig  ? ACCU-CHEK GUIDE test strip USE TO SELF MONITOR BLOOD GLUCOSE UP TO 10 TIMES DAILY (E11.65)  ? acetaminophen (TYLENOL) 500 MG tablet Take 1,000 mg by mouth every 6 (six) hours as needed for moderate pain or headache.  ? allopurinol (ZYLOPRIM) 300 MG tablet Take 300 mg by mouth every other day.  ? Ascorbic Acid (VITAMIN C) 500 MG CHEW Chew 1 tablet by mouth daily.  ? atorvastatin (LIPITOR) 40 MG tablet Take 1 tablet (40 mg total) by mouth daily at 6 PM.  ? clopidogrel (PLAVIX) 75 MG tablet Take 75 mg by mouth daily.  ? clotrimazole (MYCELEX) 10 MG troche DISSOLVE 1 TABLET UP TO 5 TIMES A DAY  ? Continuous Blood Gluc Sensor (FREESTYLE LIBRE 14 DAY SENSOR) MISC   ? diphenhydrAMINE (BENADRYL) 25 MG tablet Take 25 mg by mouth at bedtime as needed for itching (around pump site.).  ?  docusate sodium (COLACE) 100 MG capsule Take 100 mg by mouth 2 (two) times daily as needed for mild constipation.  ? fenofibrate 160 MG tablet Take 160 mg by mouth every evening.  ? gabapentin (NEURONTIN) 300 MG capsule 300 mg every evening.   ? hydrocortisone 1 % ointment Apply topically daily. APPLY TO EARS   ? Insulin Human (INSULIN PUMP) SOLN Inject 110-120 each into the skin daily. Novolog.  ? lisinopril (ZESTRIL) 10 MG tablet Take 10 mg by mouth daily.  ? loratadine (CLARITIN) 10 MG tablet Take 10 mg by mouth 2 (two) times daily.  ? montelukast (SINGULAIR) 10 MG tablet Take 10 mg by mouth daily.  ? Multiple Vitamin (MULTIVITAMIN) tablet Take 1 tablet by mouth daily.  ? NOVOLOG 100 UNIT/ML injection PER PUMP PATIENT USES 120 UNITS PER DAY DX  250.50  ? omeprazole (PRILOSEC OTC) 20 MG tablet Take 20 mg by mouth every morning.  ? traMADol (ULTRAM) 50 MG tablet Take by mouth every 6 (six) hours as needed.  ? Vitamin D, Cholecalciferol, 50 MCG (2000 UT) CAPS Take 1 capsule by mouth daily.  ? ?No facility-administered encounter medications on file as of 10/22/2021.  ? ? ?Allergies as of 10/22/2021 - Review Complete 10/22/2021  ?Allergen Reaction Noted  ? Aspirin Other (See Comments) 05/22/2008  ? Niacin and related Other (See Comments) 10/31/2014  ? Penicillins Hives, Itching, and Swelling 05/22/2008  ? Tamiflu [oseltamivir phosphate] Rash 09/26/2019  ? ? ?Past Medical History:  ?Diagnosis  Date  ? Arthritis   ? "hands; right hip/knee" (07/30/2016)  ? At risk for sleep apnea   ? STOP-BANG= 6     SENT TO PCP 06-25-2014  ? Basal cell carcinoma   ? "right temple & earlobe; frozen, burned, scraped"  ? Chronic cough   ? FORMER SMOKER  ? Chronic kidney disease (CKD), stage III (moderate) (HCC)   ? Randall [place] 07/29/2016  ? Environmental allergies   ? Essential hypertension   ? [place] 07/30/2016  ? GERD (gastroesophageal reflux disease)   ? H/O cardiovascular stress test 2013  ? "everything fine" per pt  ? High triglycerides   ? History  of diabetes with ketoacidosis   ? 1980'S  ? History of gout   ? History of hiatal hernia   ? Hyperlipidemia   ? Randall [place] 07/29/2016  ? Incomplete emptying of bladder   ? Insulin pump in place   ? Pain in right hip   ? injury 2015  ? Pneumonia 2008  ? Squamous cell cancer of external ear, right   ? Stroke [facility])   ?  subacute infarction in the posterior limb of the right internal capsule/notes 07/29/2016 "left hand and arm don't feel right yet;" (07/30/2016)  ? Tachycardia   ? Tinnitus of both ears   ? "chronic for years"  ? Type I diabetes mellitus ([place]) "dx'd ~ 1995"  ? uses insulin pump (07/30/2016)  ? Urgency of urination   ? Wears glasses   ? Wears partial dentures   ? UPPER AND LOWER  ? ? ?Past Surgical History:  ?Procedure Laterality Date  ? CATARACT EXTRACTION W/ INTRAOCULAR LENS  IMPLANT, BILATERAL  2004 & 2005  ? CIRCUMCISION  1960's  ? with Hernia Repair   ? COLONOSCOPY    ? CYSTOSCOPY WITH URETHRAL DILATATION N/A 11/02/2014  ? Procedure: CYSTOSCOPY WITH URETHRAL DILATATION AND  POSSIBLE COOK DILATIION;  Surgeon: Randall Clines, MD;  Location: [facility];  Service: Urology;  Laterality: N/A;  ? ESOPHAGOGASTRODUODENOSCOPY ENDOSCOPY  2009  ? EXCISIONAL HEMORRHOIDECTOMY    ? EYE SURGERY Right   ? "for diabetic retinopathy"  ? HEMORRHOID BANDING    ? INGUINAL HERNIA REPAIR Bilateral late 1950's - 1960s  ? "got scrotum caught in accident; had to reattach; had to do one side a 2nd time"  ? SQUAMOUS CELL CARCINOMA EXCISION Right   ? "earlove"  ? TRANSURETHRAL RESECTION OF PROSTATE N/A 06/28/2014  ? Procedure: TRANSURETHRAL RESECTION OF THE PROSTATE (TURP);  Surgeon: Randall Rud, MD;  Location: [facility];  Service: Urology;  Laterality: N/A;  ? ? ?Family History  ?Problem Relation Age of Onset  ? Heart disease Mother   ? Diabetes Father   ? Stroke Father   ? Colon cancer Neg Hx   ? ? ?Social History  ? ?Socioeconomic History  ? Marital status: Married  ?  Spouse name: Not on file  ?  Number of children: Not on file  ? Years of education: Not on file  ? Highest education level: Not on file  ?Occupational History  ? Occupation: retired  ?Tobacco Use  ? Smoking status: Former  ?  Packs/day: 1.00  ?  Years: 14.00  ?  Pack years: 14.00  ?  Types: Cigarettes  ?  Quit date: 06/22/1977  ?  Years since quitting: 44.3  ? Smokeless tobacco: Current  ?  Types: Snuff  ?Vaping Use  ? Vaping Use: Never used  ?Substance and Sexual Activity  ?  Alcohol use: No  ?  Alcohol/week: 0.0 standard drinks  ? Drug use: No  ? Sexual activity: Not Currently  ?Other Topics Concern  ? Not on file  ?Social History Narrative  ? Not on file  ? ?Social Determinants of Health  ? ?Financial Resource Strain: Not on file  ?Food Insecurity: Not on file  ?Transportation Needs: Not on file  ?Physical Activity: Not on file  ?Stress: Not on file  ?Social Connections: Not on file  ?Intimate Partner Violence: Not on file  ? ? ?Review of systems: ?Review of Systems  ?Constitutional: Negative for fever and chills.  ?HENT: Negative.   ?Eyes: Negative for blurred vision.  ?Respiratory: as per HPI  ?Cardiovascular: Negative for chest pain and palpitations.  ?Gastrointestinal: Negative for vomiting, diarrhea, blood per rectum. ?Genitourinary: Negative for dysuria, urgency, frequency and hematuria.  ?Musculoskeletal: Negative for myalgias, back pain and joint pain.  ?Skin: Negative for itching and rash.  ?Neurological: Negative for dizziness, tremors, focal weakness, seizures and loss of consciousness.  ?[place]/Allergies: Negative for environmental allergies.  ?Psychiatric/Behavioral: Negative for depression, suicidal ideas and hallucinations.  ?All other systems reviewed and are negative. ? ?Physical Exam: ?Blood pressure 110/62, pulse (!) 109, temperature 98 ?F (36.7 ?C), temperature source Oral, height '5\' 10"'$  (1.778 m), weight 205 lb (93 kg), SpO2 94 %. ?Gen:      No acute distress ?HEENT:  EOMI, sclera anicteric ?Neck:     No masses; no  thyromegaly ?Lungs:    Clear to auscultation bilaterally; normal respiratory effort ?CV:         Regular rate and rhythm; no murmurs ?Abd:      + bowel sounds; soft, non-tender; no palpable masses, no distensio

## 2021-10-22 NOTE — Patient Instructions (Signed)
We had reviewed his CT scan which shows some mild scarring of the lung ?Please avoid exposure to wood dust as it may contain mold in it ?We will check some labs today: ILD panel for further work-up of the abnormal CT ?We will also schedule PFTs at next available and follow-up after this. ?Order high-resolution CT in 6 months ? ? ?

## 2021-10-28 LAB — SJOGREN'S SYNDROME ANTIBODS(SSA + SSB)
SSA (Ro) (ENA) Antibody, IgG: <1 AI
SSB (La) (ENA) Antibody, IgG: <1 AI

## 2021-10-28 LAB — ANTI-NUCLEAR AB-TITER (ANA TITER): ANA Titer 1: 1:40 {titer} — ABNORMAL HIGH

## 2021-10-28 LAB — ANA,IFA RA DIAG PNL W/RFLX TIT/PATN
Anti Nuclear Antibody (ANA): POSITIVE — AB
Cyclic Citrullin Peptide Ab: <16 UNITS
Rheumatoid fact SerPl-aCnc: <14 IU/mL (ref <14)

## 2021-10-28 LAB — ANTI-SCLERODERMA ANTIBODY: Scleroderma (Scl-70) (ENA) Antibody, IgG: <1 AI

## 2021-10-28 LAB — ANCA SCREEN W REFLEX TITER: ANCA SCREEN: NEGATIVE

## 2021-10-29 LAB — HYPERSENSITIVITY PNEUMONITIS
A. Pullulans Abs: NEGATIVE
A.Fumigatus #1 Abs: NEGATIVE
Micropolyspora faeni, IgG: NEGATIVE
Pigeon Serum Abs: NEGATIVE
Thermoact. Saccharii: NEGATIVE
Thermoactinomyces vulgaris, IgG: NEGATIVE

## 2021-11-08 LAB — MYOMARKER 3 PLUS PROFILE (RDL)

## 2021-11-14 ENCOUNTER — Ambulatory Visit
Admission: RE | Admit: 2021-11-14 | Discharge: 2021-11-14 | Disposition: A | Discharge status: Home / Self Care | Payer: Medicare HMO | Source: Ambulatory Visit | Attending: Pulmonary Disease | Admitting: Pulmonary Disease

## 2021-11-14 DIAGNOSIS — J849 Interstitial pulmonary disease, unspecified: Secondary | ICD-10-CM

## 2021-12-11 ENCOUNTER — Ambulatory Visit (INDEPENDENT_AMBULATORY_CARE_PROVIDER_SITE_OTHER): Payer: Medicare HMO | Admitting: Pulmonary Disease

## 2021-12-11 DIAGNOSIS — J849 Interstitial pulmonary disease, unspecified: Secondary | ICD-10-CM

## 2021-12-11 LAB — PULMONARY FUNCTION TEST
DL/VA % pred: 86 %
DL/VA: 3.42 ml/min/mmHg/L
DLCO cor % pred: 59 %
DLCO cor: 14.86 ml/min/mmHg
DLCO unc % pred: 59 %
DLCO unc: 14.86 ml/min/mmHg
FEF 25-75 Post: 6.61 L/sec
FEF 25-75 Pre: 4.99 L/sec
FEF2575-%Change-Post: 32 %
FEF2575-%Pred-Post: 310 %
FEF2575-%Pred-Pre: 234 %
FEV1-%Change-Post: 8 %
FEV1-%Pred-Post: 111 %
FEV1-%Pred-Pre: 102 %
FEV1-Post: 3.32 L
FEV1-Pre: 3.06 L
FEV1FVC-%Change-Post: 0 %
FEV1FVC-%Pred-Pre: 122 %
FEV6-%Change-Post: 8 %
FEV6-%Pred-Post: 96 %
FEV6-%Pred-Pre: 88 %
FEV6-Post: 3.75 L
FEV6-Pre: 3.45 L
FEV6FVC-%Pred-Post: 106 %
FEV6FVC-%Pred-Pre: 106 %
FVC-%Change-Post: 8 %
FVC-%Pred-Post: 90 %
FVC-%Pred-Pre: 83 %
FVC-Post: 3.75 L
FVC-Pre: 3.45 L
Post FEV1/FVC ratio: 89 %
Post FEV6/FVC ratio: 100 %
Pre FEV1/FVC ratio: 89 %
Pre FEV6/FVC Ratio: 100 %
RV % pred: 58 %
RV: 1.51 L
TLC % pred: 69 %
TLC: 4.9 L

## 2021-12-11 NOTE — Progress Notes (Signed)
Full PFT performed today. °

## 2021-12-16 ENCOUNTER — Ambulatory Visit (INDEPENDENT_AMBULATORY_CARE_PROVIDER_SITE_OTHER): Payer: Medicare HMO | Admitting: Pulmonary Disease

## 2021-12-16 ENCOUNTER — Encounter: Payer: Self-pay | Admitting: Pulmonary Disease

## 2021-12-16 VITALS — BP 128/60 | HR 96 | Temp 97.9°F | Ht 70.0 in | Wt 206.2 lb

## 2021-12-16 DIAGNOSIS — R911 Solitary pulmonary nodule: Secondary | ICD-10-CM | POA: Diagnosis not present

## 2021-12-16 DIAGNOSIS — J849 Interstitial pulmonary disease, unspecified: Secondary | ICD-10-CM | POA: Diagnosis not present

## 2021-12-16 DIAGNOSIS — Z5181 Encounter for therapeutic drug level monitoring: Secondary | ICD-10-CM | POA: Diagnosis not present

## 2021-12-16 LAB — COMPREHENSIVE METABOLIC PANEL
ALT: 19 U/L (ref 0–53)
AST: 20 U/L (ref 0–37)
Albumin: 4.4 g/dL (ref 3.5–5.2)
Alkaline Phosphatase: 60 U/L (ref 39–117)
BUN: 25 mg/dL — ABNORMAL HIGH (ref 6–23)
CO2: 26 mEq/L (ref 19–32)
Calcium: 9.8 mg/dL (ref 8.4–10.5)
Chloride: 105 mEq/L (ref 96–112)
Creatinine, Ser: 1.42 mg/dL (ref 0.40–1.50)
GFR: 47.61 mL/min — ABNORMAL LOW (ref >60.00)
Glucose, Bld: 107 mg/dL — ABNORMAL HIGH (ref 70–99)
Potassium: 4.4 mEq/L (ref 3.5–5.1)
Sodium: 139 mEq/L (ref 135–145)
Total Bilirubin: 0.4 mg/dL (ref 0.2–1.2)
Total Protein: 7 g/dL (ref 6.0–8.3)

## 2021-12-16 NOTE — Progress Notes (Addendum)
Randall Horton    [id number]    02-Nov-1945  Primary Care Physician:[place], Annie Main, MD  Referring Physician: Reynold Bowen, MD [place],  [place] [address]  Chief complaint: Follow up for abnormal CT scan  HPI: 76 year old with history of hypertension, type 1 diabetes, hyperlipidemia, stroke, allergies.  He recently had a CT abdomen for work-up of abdominal pain which showed abnormal lungs Follow-up high-res CT showed pulmonary fibrosis and lung nodule and has been referred here for further evaluation  He does not have mild dyspnea on exertion with occasional sneezing and cough. 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  He continues to do woodwork as a hobby and is exposed to moldy wood dust ILD questionnaire 10/22/2021-as above Exposures: As above Smoking history: 20-pack-year smoker.  Quit in 1979 Travel history: No significant travel history Relevant family history: No family history of lung disease  Interim history: He is here for review of CT and labs States that dyspnea on exertion is unchanged  Outpatient Encounter Medications as of 12/16/2021  Medication Sig   ACCU-CHEK GUIDE test strip USE TO SELF MONITOR BLOOD GLUCOSE UP TO 10 TIMES DAILY (E11.65)   acetaminophen (TYLENOL) 500 MG tablet Take 1,000 mg by mouth every 6 (six) hours as needed for moderate pain or headache.   allopurinol (ZYLOPRIM) 300 MG tablet Take 300 mg by mouth every other day.   Ascorbic Acid (VITAMIN C) 500 MG CHEW Chew 1 tablet by mouth daily.   clopidogrel (PLAVIX) 75 MG tablet Take 75 mg by mouth daily.   clotrimazole (MYCELEX) 10 MG troche DISSOLVE 1 TABLET UP TO 5 TIMES A DAY   Continuous Blood Gluc Sensor (FREESTYLE LIBRE 14 DAY SENSOR) MISC    diphenhydrAMINE (BENADRYL) 25 MG tablet Take 25 mg by mouth at bedtime as needed for itching (around pump  site.).   docusate sodium (COLACE) 100 MG capsule Take 100 mg by mouth 2 (two) times daily as needed for mild constipation.   fenofibrate 160 MG tablet Take 160 mg by mouth every evening.   gabapentin (NEURONTIN) 300 MG capsule 300 mg every evening.    hydrocortisone 1 % ointment Apply topically daily. APPLY TO EARS    Insulin Human (INSULIN PUMP) SOLN Inject 110-120 each into the skin daily. Novolog.   lisinopril (ZESTRIL) 10 MG tablet Take 10 mg by mouth daily.   loratadine (CLARITIN) 10 MG tablet Take 10 mg by mouth 2 (two) times daily.   Multiple Vitamin (MULTIVITAMIN) tablet Take 1 tablet by mouth daily.   NOVOLOG 100 UNIT/ML injection PER PUMP PATIENT USES 120 UNITS PER DAY DX  250.50   omeprazole (PRILOSEC OTC) 20 MG tablet Take 20 mg by mouth every morning.   tamsulosin (FLOMAX) 0.4 MG CAPS capsule Take 0.4 mg by mouth daily after supper.   traMADol (ULTRAM) 50 MG tablet Take by mouth every 6 (six) hours as needed.   Vitamin D, Cholecalciferol, 50 MCG (2000 UT) CAPS Take 1 capsule by mouth daily.   atorvastatin (LIPITOR) 40 MG tablet Take 1 tablet (40 mg total) by mouth daily at 6 PM.   [DISCONTINUED] montelukast (SINGULAIR) 10 MG tablet Take 10 mg by mouth daily. (Patient not taking: Reported on 12/16/2021)   No facility-administered encounter medications on file as of 12/16/2021.   Physical Exam: Blood pressure 128/60, pulse 96, temperature 97.9  F (36.6 C), temperature source Oral, height '5\' 10"'$  (1.778 m), weight 206 lb 3.2 oz (93.5 kg), SpO2 95 %. Gen:      No acute distress HEENT:  EOMI, sclera anicteric Neck:     No masses; no thyromegaly Lungs:    Clear to auscultation bilaterally; normal respiratory effort CV:         Regular rate and rhythm; no murmurs Abd:      + bowel sounds; soft, non-tender; no palpable masses, no distension Ext:    No edema; adequate peripheral perfusion Skin:      Warm and dry; no rash Neuro: alert and oriented x 3 Psych: normal mood and affect    Data Reviewed: Imaging: CT chest 05/21/2021-mild pulmonary fibrosis in apical to basal gradient with peripheral bronchiolectasis, centrilobular nodularity in the upper lobes.  Differential diagnosis is chronic HP, alternate diagnosis. New spiculated nodule in the left pulmonary apex.    CT high-resolution 11/14/2021-slight interval progression of fibrotic lung disease now in UIP pattern.  I have reviewed the images personally  PFTs: 12/11/2021 FVC 3.75 [90%], FEV1 3.32 [111%], F/F 89, TLC 4.90 [69%], DLCO 14.86 [59%] Mild restriction, moderate diffusion defect  Labs: CTD serologies 10/22/2021-positive for ANA 1:40  Assessment:  Evaluation for interstitial lung disease His CT shows progression of fibrotic changes with no new changes of honeycombing and UIP pattern.  Differential diagnoses include IPF, chronic hypersensitivity pneumonitis as he is exposed to paint in the past and has ongoing exposure to moldy wood through his woodworking hobby though he maintains full protection with masks and ventilation while working with wood and he has been limiting his woodworking recently  Given progression of disease I have recommended that we treat with antifibrotics.  He is prone to sunburning so will avoid Esbriet.  Start paperwork for Ofev Check LFTs  Lung nodule Stable on follow-up scan.  Plan/Recommendations: Check labs High-res CT Discussion at multidisciplinary conference  Marshell Garfinkel MD [facility] 12/16/2021, 10:35 AM  CC: Reynold Bowen, MD  Addendum Discussed at [facility] ILD conference 02/06/22 CT is LATEST SCAN: latest is subpleural predomm early HC, , bilateral bibasal reticlation , No AT,, CCG +. Latest scan is definite UIP. OLD scan was called ALTERNATE because of som e nodularity that in retrospect was transient. Agree with some progression. DX - Clinical IPF - white male progression UIP and woodwork

## 2021-12-19 ENCOUNTER — Telehealth: Payer: Self-pay

## 2021-12-19 NOTE — Telephone Encounter (Signed)
Received New start paperwork for OFEV. Will update as we work through the benefits process.  Attempted to submit a Prior Authorization request to [facility] for OFEV via CoverMyMeds, however received notification that the request could not be handled through ePA and that the plan would need to be contacted for f/u.  Phone# [phone] Fax# [phone]   Key: [id number]

## 2022-01-06 ENCOUNTER — Other Ambulatory Visit (HOSPITAL_COMMUNITY): Payer: Self-pay

## 2022-01-06 NOTE — Telephone Encounter (Signed)
Received notification from [facility] regarding a prior authorization for [place]. Authorization has been APPROVED from 06/22/2021 to 06/21/22.   Per test claim, copay for 30 days supply is $155.00  Patient can fill through [place]: [phone]   Authorization # [id number]

## 2022-01-06 NOTE — Telephone Encounter (Signed)
Spoke with rep who was able to create a new PA request for the pt, however required the remaining portion of the request to be completed via CMM. Pulled up the request and filled out until receiving the same cancellation.  Reached back out and spoke with second rep who ran a f/u test claim and informed me that PA could not be completed online because there simply was no clinical questions to be answered. Rep began working on new case in preparation to transfer me to pharmacist for clinical determination.  Case ID: [id number]  Spoke with pharmacist and provided diagnosis code. Request has been approved, will await approval fax.

## 2022-01-06 NOTE — Telephone Encounter (Signed)
Reached out to pt to inquire about financial documentation, pt expressed that he may be having second thoughts about taking the medication (side effects vs actual benefit of taking medication). Offered to let him speak with [facility] to go over clinical information, he stated that he would like to do that but that now was not a good time.   Provided Devki's direct phone number to him as well as her office hours. Routing to her now for f/u.

## 2022-01-07 ENCOUNTER — Telehealth: Payer: Self-pay | Admitting: Pharmacist

## 2022-01-07 DIAGNOSIS — Z5181 Encounter for therapeutic drug level monitoring: Secondary | ICD-10-CM

## 2022-01-07 DIAGNOSIS — J849 Interstitial pulmonary disease, unspecified: Secondary | ICD-10-CM

## 2022-01-07 NOTE — Telephone Encounter (Signed)
Counseled patient and his wife in detail about Ofev. He would like to move forward. He will be interested in [employer] enrollment as wel.   They will plan to drop off Social Security statements and retirement plan benefit statement in the next week or so (no definite date provided). They have been advised to notify the front desk that the documents are for the pharmacy team.  Knox Saliva, PharmD, MPH, BCPS, CPP Clinical Pharmacist (Rheumatology and Pulmonology)

## 2022-01-07 NOTE — Telephone Encounter (Signed)
Subjective:  Patient and his wife, Violeta Gelinas, called today by [facility] pharmacy team for Ofev counseling.   Patient was last seen by Dr. Vaughan Browner on 12/16/21. Pertinent past medical history includes IPF, GERD, CVA, HTN, T1DM, CKD III, hyperlipidemia, prior stroke (on clopidogrel)   He is not yet approved for Ofev but wanted to discuss the medications first prior  He states he picked up a small oxygen device "airplane oxygen" from pharmacy and inquires if he can use if he is short of breath from exertion.  He does report more DOE - can't go for a walk down the street without needing to stop and catch his breath.  He asks if he believes he needs a second opinion on diagnosis  History of CAD: No History of MI: No Current anticoagulant use: No History of HTN: Yes  History of elevated LFTs: No History of diarrhea, nausea, vomiting: No  Objective: Allergies  Allergen Reactions   Aspirin Other (See Comments)    Severe stomach cramps + constipation.    Niacin And Related Other (See Comments)    "flushing"   Penicillins Hives, Itching and Swelling    Has patient had a PCN reaction causing immediate rash, facial/tongue/throat swelling, SOB or lightheadedness with hypotension: YES Has patient had a PCN reaction causing severe rash involving mucus membranes or skin necrosis: NO Has patient had a PCN reaction that required hospitalization NO Has patient had a PCN reaction occurring within the last 10 years: NO If all of the above answers are "NO", then may proceed with Cephalosporin use.   Tamiflu [Oseltamivir Phosphate] Rash    Outpatient Encounter Medications as of 01/07/2022  Medication Sig   ACCU-CHEK GUIDE test strip USE TO SELF MONITOR BLOOD GLUCOSE UP TO 10 TIMES DAILY (E11.65)   acetaminophen (TYLENOL) 500 MG tablet Take 1,000 mg by mouth every 6 (six) hours as needed for moderate pain or headache.   allopurinol (ZYLOPRIM) 300 MG tablet Take 300 mg by mouth every other day.    Ascorbic Acid (VITAMIN C) 500 MG CHEW Chew 1 tablet by mouth daily.   atorvastatin (LIPITOR) 40 MG tablet Take 1 tablet (40 mg total) by mouth daily at 6 PM.   clopidogrel (PLAVIX) 75 MG tablet Take 75 mg by mouth daily.   clotrimazole (MYCELEX) 10 MG troche DISSOLVE 1 TABLET UP TO 5 TIMES A DAY   Continuous Blood Gluc Sensor (FREESTYLE LIBRE 14 DAY SENSOR) MISC    diphenhydrAMINE (BENADRYL) 25 MG tablet Take 25 mg by mouth at bedtime as needed for itching (around pump site.).   docusate sodium (COLACE) 100 MG capsule Take 100 mg by mouth 2 (two) times daily as needed for mild constipation.   fenofibrate 160 MG tablet Take 160 mg by mouth every evening.   gabapentin (NEURONTIN) 300 MG capsule 300 mg every evening.    hydrocortisone 1 % ointment Apply topically daily. APPLY TO EARS    Insulin Human (INSULIN PUMP) SOLN Inject 110-120 each into the skin daily. Novolog.   lisinopril (ZESTRIL) 10 MG tablet Take 10 mg by mouth daily.   loratadine (CLARITIN) 10 MG tablet Take 10 mg by mouth 2 (two) times daily.   Multiple Vitamin (MULTIVITAMIN) tablet Take 1 tablet by mouth daily.   NOVOLOG 100 UNIT/ML injection PER PUMP PATIENT USES 120 UNITS PER DAY DX  250.50   omeprazole (PRILOSEC OTC) 20 MG tablet Take 20 mg by mouth every morning.   tamsulosin (FLOMAX) 0.4 MG CAPS capsule Take 0.4 mg  by mouth daily after supper.   traMADol (ULTRAM) 50 MG tablet Take by mouth every 6 (six) hours as needed.   Vitamin D, Cholecalciferol, 50 MCG (2000 UT) CAPS Take 1 capsule by mouth daily.   No facility-administered encounter medications on file as of 01/07/2022.      There is no immunization history on file for this patient.    PFT's TLC  Date Value Ref Range Status  12/11/2021 4.90 L Final      CMP     Component Value Date/Time   NA 139 12/16/2021 1104   K 4.4 12/16/2021 1104   CL 105 12/16/2021 1104   CO2 26 12/16/2021 1104   GLUCOSE 107 (H) 12/16/2021 1104   BUN 25 (H) 12/16/2021 1104    CREATININE 1.42 12/16/2021 1104   CALCIUM 9.8 12/16/2021 1104   PROT 7.0 12/16/2021 1104   ALBUMIN 4.4 12/16/2021 1104   AST 20 12/16/2021 1104   ALT 19 12/16/2021 1104   ALKPHOS 60 12/16/2021 1104   BILITOT 0.4 12/16/2021 1104   GFRNONAA 55 (L) 07/30/2016 1136   GFRAA >60 07/30/2016 1136      CBC    Component Value Date/Time   WBC 8.5 07/29/2016 1221   RBC 4.78 07/29/2016 1221   HGB 14.8 07/29/2016 1221   HCT 43.8 07/29/2016 1221   PLT 239 07/29/2016 1221   MCV 91.6 07/29/2016 1221   MCH 31.0 07/29/2016 1221   MCHC 33.8 07/29/2016 1221   RDW 13.6 07/29/2016 1221   LYMPHSABS 1.5 07/29/2016 1221   MONOABS 0.8 07/29/2016 1221   EOSABS 0.4 07/29/2016 1221   BASOSABS 0.1 07/29/2016 1221      LFT's    Latest Ref Rng & Units 12/16/2021   11:04 AM 07/29/2016   12:21 PM  Hepatic Function  Total Protein 6.0 - 8.3 g/dL 7.0  7.1   Albumin 3.5 - 5.2 g/dL 4.4  4.3   AST 0 - 37 U/L 20  33   ALT 0 - 53 U/L 19  40   Alk Phosphatase 39 - 117 U/L 60  69   Total Bilirubin 0.2 - 1.2 mg/dL 0.4  0.4       HRCT (11/17/21) - Spectrum of findings compatible with mild-to-moderate fibrotic interstitial lung disease with a mild basilar predominance and with small focus of early honeycombing at the right lung base. Evidence of slight interval progression since 05/21/2021 chest CT. Findings are consistent with UIP per consensus guidelines  Assessment and Plan  Ofev Medication Management Thoroughly counseled patient and wife on the efficacy, mechanism of action, dosing, administration, adverse effects, and monitoring parameters of Ofev. Patient verbalized understanding.   Goals of Therapy: Will not stop or reverse the progression of ILD. It will slow the progression of ILD.  Inhibits tyrosine kinase inhibitors which slow the fibrosis/progression of ILD -Significant reduction in the rate of disease progression was observed after treatment (61.1% [before] vs 33.3% [after], P?=?0.008) over 42  weeks.  Dosing: 150 mg (one capsule) by mouth twice daily (approx 12 hours apart). Discussed taking with food approximately 12 hours apart. Discussed that capsule should not be crushed or split. Reviewed that if he does not tolerate, he can reduce dose to 142m after discussion with Dr. MVaughan Browner   Adverse Effects: Nausea, vomiting, diarrhea (2 in 3 patients) appetite loss, weight loss - management of diarrhea with loperamide discussed including max use of 48 hours and max of 8 capsules per day. Reviewed that diarrhea generally resolves within several  days once Ofev is held. Reviewed option to lower dose to 174m twice daily as well. Abdominal pain (up to 1 in 5 patients) Nasopharyngitis (13%), UTI (6%) Risk of thrombosis (3%) and acute MI (2%) Hypertension (5%) Dizziness Fatigue (10%) He is interested in enrolling into [facility] [employer] support program once patient assistance is approved.  Monitoring: Monitor for diarrhea, nausea and vomiting, GI perforation, hepatotoxicity  Monitor LFTs - baseline, monthly for first 6 months, then every 3 months routinely CBC w differential at baseline and every 3 months routinely  Access: Approval will be through patient assistance. Coverage is still pending. They will drop off income documents to move forward with patient assistance application  I reviewed that if he is interested in a second opinion, Dr. MVaughan Brownercould always discuss [facility].  I reviewed importance of notifying our clinic if he is having increasing DOE - if he needs oxygen Dr. MVaughan Brownerwould need to order procedures for him to qualify. He verbalized understanding.  Patient in agreement to trial medication and see how he tolerates.  Medication Reconciliation A drug regimen assessment was performed, including review of allergies, interactions, disease-state management, dosing and immunization history. Medications were reviewed with the patient, including name, instructions,  indication, goals of therapy, potential side effects, importance of adherence, and safe use.  Anticoagulant use: No  This appointment required 20 minutes of patient care (this includes precharting, chart review, review of results, face-to-face care, etc.).  Thank you for involving pharmacy to assist in providing this patient's care.  DKnox Saliva PharmD, MPH, BCPS, CPP Clinical Pharmacist (Rheumatology and Pulmonology)

## 2022-01-12 NOTE — Telephone Encounter (Signed)
Received a fax from  [facility] regarding an approval for [place] patient assistance from 01/12/2022 to 06/21/2022. Approval letter sent to scan center. Contacted pt and provided update as well as [employer]'s phone number. Requested pt reach out to [place] here at the clinic if he has any questions or concerns, pt verbalized understanding to all.  Phone number: [phone]

## 2022-01-12 NOTE — Telephone Encounter (Signed)
Submitted Patient Assistance Application to [employer] for OFEV along with provider portion, PA and income documents. Will update patient when we receive a response.  Fax# [phone] Phone# [phone] 

## 2022-01-22 NOTE — Telephone Encounter (Signed)
ATC patient to determine if he has received Ofev shipment. Left VM requesting return call for update  Knox Saliva, PharmD, MPH, BCPS, CPP Clinical Pharmacist (Rheumatology and Pulmonology)

## 2022-01-23 MED ORDER — OFEV 150 MG PO CAPS: 150.0000 mg | ORAL_CAPSULE | Freq: Two times a day (BID) | ORAL | 5 refills | Status: DC

## 2022-01-23 NOTE — Addendum Note (Signed)
Addended by: Cassandria Anger on: 01/23/2022 11:37 AM   Modules accepted: Orders

## 2022-01-23 NOTE — Telephone Encounter (Signed)
Patient states he started Ofev on 01/16/22. Reports no issues, side effects (diarrhea). He reports minor stomach ache for several days. Patient states he almost gets close to headache. No nausea.  Reports he has an appointment with [facility] nurse educator upcoming week.  Ofev '150mg'$  twice daily added to his med list.  Future order for CMET placed. Patient advised to stop by the clinic the last of August for [place].  Knox Saliva, PharmD, MPH, BCPS, CPP Clinical Pharmacist (Rheumatology and Pulmonology)

## 2022-01-26 NOTE — Telephone Encounter (Addendum)
Received a call back from patient via voicemail.

## 2022-02-11 ENCOUNTER — Telehealth: Payer: Self-pay | Admitting: Pharmacist

## 2022-02-11 DIAGNOSIS — J849 Interstitial pulmonary disease, unspecified: Secondary | ICD-10-CM

## 2022-02-11 NOTE — Telephone Encounter (Signed)
Patient left a VM for me.  He is interested in pulmonary rehab and inquires if Dr. Vaughan Browner would consider a referral to pulmonary rehab.  He states he bought an oximeter and is measuring pulse and O2 about 2-3 times a day.  Patient states he ran low-grade fever, tested negative for COVID, and got sick yesterday. Woke up at 2:30am sweating. States he woke up feeling better. Patient hasn't lost appetite yesterday.  Knox Saliva, PharmD, MPH, BCPS, CPP Clinical Pharmacist (Rheumatology and Pulmonology)

## 2022-02-11 NOTE — Telephone Encounter (Signed)
Error

## 2022-02-16 ENCOUNTER — Other Ambulatory Visit (INDEPENDENT_AMBULATORY_CARE_PROVIDER_SITE_OTHER): Payer: Medicare HMO

## 2022-02-16 DIAGNOSIS — Z5181 Encounter for therapeutic drug level monitoring: Secondary | ICD-10-CM | POA: Diagnosis not present

## 2022-02-16 DIAGNOSIS — J849 Interstitial pulmonary disease, unspecified: Secondary | ICD-10-CM

## 2022-02-16 LAB — COMPREHENSIVE METABOLIC PANEL
ALT: 43 U/L (ref 0–53)
AST: 29 U/L (ref 0–37)
Albumin: 4.3 g/dL (ref 3.5–5.2)
Alkaline Phosphatase: 88 U/L (ref 39–117)
BUN: 22 mg/dL (ref 6–23)
CO2: 25 mEq/L (ref 19–32)
Calcium: 9.3 mg/dL (ref 8.4–10.5)
Chloride: 104 mEq/L (ref 96–112)
Creatinine, Ser: 1.42 mg/dL (ref 0.40–1.50)
GFR: 47.55 mL/min — ABNORMAL LOW (ref >60.00)
Glucose, Bld: 160 mg/dL — ABNORMAL HIGH (ref 70–99)
Potassium: 4.2 mEq/L (ref 3.5–5.1)
Sodium: 138 mEq/L (ref 135–145)
Total Bilirubin: 0.6 mg/dL (ref 0.2–1.2)
Total Protein: 7 g/dL (ref 6.0–8.3)

## 2022-02-19 NOTE — Telephone Encounter (Signed)
Called and spoke with patient.  Patient requested pulmonary rehab at [facility].  Pulmonary rehab referral placed.  Nothing further at this time.

## 2022-03-19 ENCOUNTER — Ambulatory Visit (INDEPENDENT_AMBULATORY_CARE_PROVIDER_SITE_OTHER): Payer: Medicare HMO | Admitting: Pulmonary Disease

## 2022-03-19 ENCOUNTER — Encounter: Payer: Self-pay | Admitting: Pulmonary Disease

## 2022-03-19 ENCOUNTER — Telehealth: Payer: Self-pay | Admitting: Pharmacist

## 2022-03-19 VITALS — BP 136/76 | HR 95 | Temp 97.8°F | Ht 70.0 in | Wt 203.0 lb

## 2022-03-19 DIAGNOSIS — R0602 Shortness of breath: Secondary | ICD-10-CM

## 2022-03-19 DIAGNOSIS — Z5181 Encounter for therapeutic drug level monitoring: Secondary | ICD-10-CM | POA: Diagnosis not present

## 2022-03-19 DIAGNOSIS — J849 Interstitial pulmonary disease, unspecified: Secondary | ICD-10-CM

## 2022-03-19 LAB — COMPREHENSIVE METABOLIC PANEL
ALT: 29 U/L (ref 0–53)
AST: 23 U/L (ref 0–37)
Albumin: 4.4 g/dL (ref 3.5–5.2)
Alkaline Phosphatase: 79 U/L (ref 39–117)
BUN: 22 mg/dL (ref 6–23)
CO2: 24 mEq/L (ref 19–32)
Calcium: 9.8 mg/dL (ref 8.4–10.5)
Chloride: 105 mEq/L (ref 96–112)
Creatinine, Ser: 1.43 mg/dL (ref 0.40–1.50)
GFR: 47.12 mL/min — ABNORMAL LOW (ref >60.00)
Glucose, Bld: 197 mg/dL — ABNORMAL HIGH (ref 70–99)
Potassium: 4.7 mEq/L (ref 3.5–5.1)
Sodium: 137 mEq/L (ref 135–145)
Total Bilirubin: 0.5 mg/dL (ref 0.2–1.2)
Total Protein: 7.3 g/dL (ref 6.0–8.3)

## 2022-03-19 MED ORDER — OFEV 100 MG PO CAPS: 100.0000 mg | ORAL_CAPSULE | Freq: Two times a day (BID) | ORAL | 1 refills | Status: DC

## 2022-03-19 NOTE — Telephone Encounter (Addendum)
Patient receives Ofev thru patient assistance. Prescription for Ofev '100mg'$  twice daily sent to [facility].   LFTs on 03/19/22 wnl  Spoke states he was having abdominal pain from Ofev '150mg'$  dose and increasing diarrhea. Symptoms have resolved since last dose on Saturday. Advised pt to reach out to schedule shipment to his home. He verbalized understanding.  Knox Saliva, PharmD, MPH, BCPS, CPP Clinical Pharmacist (Rheumatology and Pulmonology)  ----- Message from Marshell Garfinkel, MD sent at 03/19/2022 10:38 AM EDT ----- Can you reduce Ofev dose to 100 mg twice daily due to side effects.  Thank you.

## 2022-03-19 NOTE — Progress Notes (Signed)
Randall Horton    [id number]    1946-04-22  Primary Care Physician:South, Annie Main, MD  Referring Physician: Reynold Bowen, MD [place],   [id number]  Chief complaint: Follow up for abnormal CT scan  HPI: 76 year old with history of hypertension, type 1 diabetes, hyperlipidemia, stroke, allergies.  He recently had a CT abdomen for work-up of abdominal pain which showed abnormal lungs Follow-up high-res CT showed pulmonary fibrosis and lung nodule and has been referred here for further evaluation  He does not have mild dyspnea on exertion with occasional sneezing and cough. 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  He continues to do woodwork as a hobby and is exposed to moldy wood dust ILD questionnaire 10/22/2021-as above Exposures: As above Smoking history: 20-pack-year smoker.  Quit in 1979 Travel history: No significant travel history Relevant family history: No family history of lung disease  Interim history: Started Ofev on on 01/16/2022. Initially it was well tolerated but has progressive stomach discomfort and diarrhea that did not get better with immodium and mylanta. Finally stopped it last week with improvement in symptoms  Started pulmonary rehab at [facility]  Discussed at [facility] ILD conference 02/06/22 CT is LATEST SCAN: latest is subpleural predomm early HC, , bilateral bibasal reticlation , No AT,, CCG +. Latest scan is definite UIP. OLD scan was called ALTERNATE because of som e nodularity that in retrospect was transient. Agree with some progression. DX - Clinical IPF - white male progression UIP and woodwork  Outpatient Encounter Medications as of 03/19/2022  Medication Sig   ACCU-CHEK GUIDE test strip USE TO SELF MONITOR BLOOD GLUCOSE UP TO 10 TIMES DAILY (E11.65)   acetaminophen (TYLENOL) 500 MG tablet Take 1,000 mg  by mouth every 6 (six) hours as needed for moderate pain or headache.   allopurinol (ZYLOPRIM) 300 MG tablet Take 300 mg by mouth every other day.   Ascorbic Acid (VITAMIN C) 500 MG CHEW Chew 1 tablet by mouth daily.   clopidogrel (PLAVIX) 75 MG tablet Take 75 mg by mouth daily.   clotrimazole (MYCELEX) 10 MG troche DISSOLVE 1 TABLET UP TO 5 TIMES A DAY   Continuous Blood Gluc Sensor (FREESTYLE LIBRE 14 DAY SENSOR) MISC    diphenhydrAMINE (BENADRYL) 25 MG tablet Take 25 mg by mouth at bedtime as needed for itching (around pump site.).   docusate sodium (COLACE) 100 MG capsule Take 100 mg by mouth 2 (two) times daily as needed for mild constipation.   fenofibrate 160 MG tablet Take 160 mg by mouth every evening.   gabapentin (NEURONTIN) 300 MG capsule 300 mg every evening.    hydrocortisone 1 % ointment Apply topically daily. APPLY TO EARS    Insulin Human (INSULIN PUMP) SOLN Inject 110-120 each into the skin daily. Novolog.   lisinopril (ZESTRIL) 10 MG tablet Take 10 mg by mouth daily.   loratadine (CLARITIN) 10 MG tablet Take 10 mg by mouth 2 (two) times daily.   Multiple Vitamin (MULTIVITAMIN) tablet Take 1 tablet by mouth daily.   Nintedanib (OFEV) 150 MG CAPS Take 1 capsule (150 mg total) by mouth 2 (two) times daily.   NOVOLOG 100 UNIT/ML injection PER PUMP PATIENT USES 120 UNITS PER DAY DX  250.50   omeprazole (PRILOSEC OTC) 20 MG tablet Take 20 mg by mouth every morning.   tamsulosin (FLOMAX)  0.4 MG CAPS capsule Take 0.4 mg by mouth daily after supper.   traMADol (ULTRAM) 50 MG tablet Take by mouth every 6 (six) hours as needed.   Vitamin D, Cholecalciferol, 50 MCG (2000 UT) CAPS Take 1 capsule by mouth daily.   atorvastatin (LIPITOR) 40 MG tablet Take 1 tablet (40 mg total) by mouth daily at 6 PM.   No facility-administered encounter medications on file as of 03/19/2022.   Physical Exam: Blood pressure 136/76, pulse 95, temperature 97.8 F (36.6 C), temperature source Oral, height  '5\' 10"'$  (1.778 m), weight 203 lb (92.1 kg), SpO2 100 %. Gen:      No acute distress HEENT:  EOMI, sclera anicteric Neck:     No masses; no thyromegaly Lungs:    Clear to auscultation bilaterally; normal respiratory effort CV:         Regular rate and rhythm; no murmurs Abd:      + bowel sounds; soft, non-tender; no palpable masses, no distension Ext:    No edema; adequate peripheral perfusion Skin:      Warm and dry; no rash Neuro: alert and oriented x 3 Psych: normal mood and affect   Data Reviewed: Imaging: CT chest 05/21/2021-mild pulmonary fibrosis in apical to basal gradient with peripheral bronchiolectasis, centrilobular nodularity in the upper lobes.  Differential diagnosis is chronic HP, alternate diagnosis. New spiculated nodule in the left pulmonary apex.    CT high-resolution 11/14/2021-slight interval progression of fibrotic lung disease now in UIP pattern.  I have reviewed the images personally  PFTs: 12/11/2021 FVC 3.75 [90%], FEV1 3.32 [111%], F/F 89, TLC 4.90 [69%], DLCO 14.86 [59%] Mild restriction, moderate diffusion defect  Labs: CTD serologies 10/22/2021-positive for ANA 1:40 CMP 02/16/2022- WNL  Assessment:  Evaluation for interstitial lung disease His CT shows progression of fibrotic changes with no new changes of honeycombing and UIP pattern.  Differential diagnoses include IPF, chronic hypersensitivity pneumonitis as he is exposed to paint in the past and has ongoing exposure to moldy wood through his woodworking hobby though he maintains full protection with masks and ventilation while working with wood and he has been limiting his woodworking recently  Given progression of disease I have recommended that we treat with antifibrotics.  Discussion had mild 3 interdisciplinary conference confirms diagnosis of IPF. He is prone to sunburning so will avoid [place].  Ofev is poorly tolerated due to GI symptoms We will give him a break and restart at a lower dose of 100  mg twice daily Check LFTs  Continue pulmonary rehab  Lung nodule Stable on follow-up scan.  Plan/Recommendations: Check labs Reduce Ofev dose to 100 mg twice daily  Marshell Garfinkel MD [facility] 03/19/2022, 10:20 AM  CC: Reynold Bowen, MD

## 2022-03-19 NOTE — Patient Instructions (Signed)
We will check labs today including comprehensive metabolic panel, proBNP We will reduce Ofev dose to 100 mg twice daily Continue exercise program Follow-up in 3 months.

## 2022-03-19 NOTE — Progress Notes (Signed)
Rx for Ofev '100mg'$  twice daily sent to [place], PharmD, MPH, BCPS, CPP Clinical Pharmacist (Rheumatology and Pulmonology)

## 2022-03-20 LAB — PRO B NATRIURETIC PEPTIDE: NT-Pro BNP: 66 pg/mL (ref 0–486)

## 2022-03-24 ENCOUNTER — Telehealth: Payer: Self-pay | Admitting: Pulmonary Disease

## 2022-03-24 NOTE — Telephone Encounter (Signed)
Called patient and he states for about a week now he has chest congestion, runny nose, productive cough. Slight fever last night of 99.2. Covid was negative last night. Flu show was done last week.   OTC meds:  Robitussin for cough Mucinex '600mg'$   Please advise sir

## 2022-03-24 NOTE — Telephone Encounter (Signed)
Pt believes he has a cold. States he is negative for covid and that he already had his flu shot. Symptoms include chest congestion, runny nose, wet cough. Symptoms began yesterday morning. LOV 03/19/22. Is taking OTC meds but wants to know if there is anything else he can do. Pharmacy is [facility] [address] in [place]. Denied appt as he was just seen

## 2022-03-24 NOTE — Telephone Encounter (Signed)
Called patient and went over recommendations from Dr Vaughan Browner. Patient verbalized understanding. Nothing further needed

## 2022-03-24 NOTE — Telephone Encounter (Signed)
Agree with Robitussin and Mucinex Stay well-hydrated Can take Tylenol for cough Would not give antibiotics or steroids just now as this appears like a viral infection If symptoms worsen then please give a call back or go to the emergency room.

## 2022-04-21 ENCOUNTER — Telehealth: Payer: Self-pay | Admitting: Pulmonary Disease

## 2022-04-21 NOTE — Telephone Encounter (Signed)
Spoke with pt who states he is currently taking 100 mg BID of Ofev. He stated that at night he is having a small amount on loose stools. He stated that Monday evening he noticed a dull pain in the left lower abdomen that has progressed in a soreness with palpation around his belt line on left side of his abdomen. He thinks this may be a side effect of the Ofev but just wants Dr. Vaughan Browner to advise on situation. Dr. Vaughan Browner please advise.

## 2022-04-21 NOTE — Telephone Encounter (Signed)
Called and spoke with patient.  Tammy,NP recommendations given.  Understanding stated. Nothing further at this time.

## 2022-04-21 NOTE — Telephone Encounter (Signed)
Sorry to hear that his stomach is feeling well.  May hold Ofev for 1 week and then restart back slowly with 1 at bedtime for 1 week and then start twice daily.  However patient should be seen by his primary care provider as upset stomach diarrhea and decreased appetite are all side effects of Ofev.  Typically abdominal pain is not common unless it is cramping with diarrhea I would advise him to follow-up with his primary care provider for ongoing abdominal pain May also try Gas-X.  Please contact office for sooner follow up if symptoms do not improve or worsen or seek emergency care

## 2022-04-22 ENCOUNTER — Other Ambulatory Visit (INDEPENDENT_AMBULATORY_CARE_PROVIDER_SITE_OTHER): Payer: Medicare HMO

## 2022-04-22 DIAGNOSIS — Z5181 Encounter for therapeutic drug level monitoring: Secondary | ICD-10-CM

## 2022-04-22 LAB — COMPREHENSIVE METABOLIC PANEL
ALT: 18 U/L (ref 0–53)
AST: 17 U/L (ref 0–37)
Albumin: 4.4 g/dL (ref 3.5–5.2)
Alkaline Phosphatase: 65 U/L (ref 39–117)
BUN: 20 mg/dL (ref 6–23)
CO2: 29 mEq/L (ref 19–32)
Calcium: 9.5 mg/dL (ref 8.4–10.5)
Chloride: 104 mEq/L (ref 96–112)
Creatinine, Ser: 1.55 mg/dL — ABNORMAL HIGH (ref 0.40–1.50)
GFR: 42.75 mL/min — ABNORMAL LOW (ref >60.00)
Glucose, Bld: 184 mg/dL — ABNORMAL HIGH (ref 70–99)
Potassium: 4 mEq/L (ref 3.5–5.1)
Sodium: 139 mEq/L (ref 135–145)
Total Bilirubin: 0.4 mg/dL (ref 0.2–1.2)
Total Protein: 7.2 g/dL (ref 6.0–8.3)

## 2022-05-25 ENCOUNTER — Encounter: Payer: Self-pay | Admitting: Pulmonary Disease

## 2022-05-25 ENCOUNTER — Ambulatory Visit (INDEPENDENT_AMBULATORY_CARE_PROVIDER_SITE_OTHER): Payer: Medicare HMO | Admitting: Pulmonary Disease

## 2022-05-25 VITALS — BP 134/64 | HR 101 | Temp 97.8°F | Ht 70.5 in | Wt 203.8 lb

## 2022-05-25 DIAGNOSIS — Z5181 Encounter for therapeutic drug level monitoring: Secondary | ICD-10-CM

## 2022-05-25 DIAGNOSIS — J849 Interstitial pulmonary disease, unspecified: Secondary | ICD-10-CM

## 2022-05-25 NOTE — Progress Notes (Signed)
Randall Horton    [id number]    Nov 09, 1945  Primary Care Physician:South, Annie Main, MD  Referring Physician: Reynold Bowen, MD [address] [place],  [place] [address]  Chief complaint:  Follow-up for IPF Ofev started July 2023, dose reduced September 2970  HPI: 76 year old with history of hypertension, type 1 diabetes, hyperlipidemia, stroke, allergies.  He recently had a CT abdomen for work-up of abdominal pain which showed abnormal lungs Follow-up high-res CT showed pulmonary fibrosis and lung nodule and has been referred here for further evaluation  He does not have mild dyspnea on exertion with occasional sneezing and cough. History notable for GERD, hiatal hernia, exposures as below  Discussed at [facility] ILD conference 02/06/22 CT is LATEST SCAN: latest is subpleural predomm early HC, , bilateral bibasal reticlation , No AT,, CCG +. Latest scan is definite UIP. OLD scan was called ALTERNATE because of som e nodularity that in retrospect was transient. Agree with some progression. 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  He continues to do woodwork as a hobby and is exposed to moldy wood dust ILD questionnaire 10/22/2021-as above Exposures: As above Smoking history: 20-pack-year smoker.  Quit in 1979 Travel history: No significant travel history Relevant family history: No family history of lung disease  Interim history: Started Ofev on on 01/16/2022. Initially it was well tolerated but has progressive stomach discomfort and diarrhea that did not get better with immodium and mylanta.  Dose reduced to 100 mg twice daily and September 2023.  This appears to be tolerated better  Continuing pulmonary rehab at [facility] complaint today is postnasal drip and congestion.  Outpatient Encounter Medications as of 05/25/2022  Medication Sig    ACCU-CHEK GUIDE test strip USE TO SELF MONITOR BLOOD GLUCOSE UP TO 10 TIMES DAILY (E11.65)   acetaminophen (TYLENOL) 500 MG tablet Take 1,000 mg by mouth every 6 (six) hours as needed for moderate pain or headache.   allopurinol (ZYLOPRIM) 300 MG tablet Take 300 mg by mouth every other day.   Ascorbic Acid (VITAMIN C) 500 MG CHEW Chew 1 tablet by mouth daily.   clopidogrel (PLAVIX) 75 MG tablet Take 75 mg by mouth daily.   clotrimazole (MYCELEX) 10 MG troche DISSOLVE 1 TABLET UP TO 5 TIMES A DAY   Continuous Blood Gluc Sensor (FREESTYLE LIBRE 14 DAY SENSOR) MISC    diphenhydrAMINE (BENADRYL) 25 MG tablet Take 25 mg by mouth at bedtime as needed for itching (around pump site.).   docusate sodium (COLACE) 100 MG capsule Take 100 mg by mouth 2 (two) times daily as needed for mild constipation.   fenofibrate 160 MG tablet Take 160 mg by mouth every evening.   gabapentin (NEURONTIN) 300 MG capsule 300 mg every evening.    hydrocortisone 1 % ointment Apply topically daily. APPLY TO EARS    Insulin Human (INSULIN PUMP) SOLN Inject 110-120 each into the skin daily. Novolog.   lisinopril (ZESTRIL) 10 MG tablet Take 10 mg by mouth daily.   loratadine (CLARITIN) 10 MG tablet Take 10 mg by mouth 2 (two) times daily.   Multiple Vitamin (MULTIVITAMIN) tablet Take 1 tablet by mouth daily.   Nintedanib (OFEV) 100 MG CAPS Take 1 capsule (100 mg total) by mouth 2 (two) times daily. **LOWER DOSE**   NOVOLOG 100 UNIT/ML injection PER PUMP PATIENT USES  120 UNITS PER DAY DX  250.50   Omega 3 1000 MG CAPS Take by mouth.   omeprazole (PRILOSEC OTC) 20 MG tablet Take 20 mg by mouth every morning.   tamsulosin (FLOMAX) 0.4 MG CAPS capsule Take 0.4 mg by mouth daily after supper.   traMADol (ULTRAM) 50 MG tablet Take by mouth every 6 (six) hours as needed.   Vitamin D, Cholecalciferol, 50 MCG (2000 UT) CAPS Take 1 capsule by mouth daily.   atorvastatin (LIPITOR) 40 MG tablet Take 1 tablet (40 mg total) by mouth daily at  6 PM.   No facility-administered encounter medications on file as of 05/25/2022.   Physical Exam: Blood pressure 134/64, pulse (!) 101, temperature 97.8 F (36.6 C), temperature source Oral, height 5' 10.5" (1.791 m), weight 203 lb 12.8 oz (92.4 kg), SpO2 97 %. Gen:      No acute distress HEENT:  EOMI, sclera anicteric Neck:     No masses; no thyromegaly Lungs:   Bibasal crackles. CV:         Regular rate and rhythm; no murmurs Abd:      + bowel sounds; soft, non-tender; no palpable masses, no distension Ext:    No edema; adequate peripheral perfusion Skin:      Warm and dry; no rash Neuro: alert and oriented x 3 Psych: normal mood and affect   Data Reviewed: Imaging: CT chest 05/21/2021-mild pulmonary fibrosis in apical to basal gradient with peripheral bronchiolectasis, centrilobular nodularity in the upper lobes.  Differential diagnosis is chronic HP, alternate diagnosis. New spiculated nodule in the left pulmonary apex.    CT high-resolution 11/14/2021-slight interval progression of fibrotic lung disease now in UIP pattern.  I have reviewed the images personally  PFTs: 12/11/2021 FVC 3.75 [90%], FEV1 3.32 [111%], F/F 89, TLC 4.90 [69%], DLCO 14.86 [59%] Mild restriction, moderate diffusion defect  Labs: CTD serologies 10/22/2021-positive for ANA 1:40 CMP 02/16/2022- WNL  Assessment:  Evaluation for interstitial lung disease His CT shows progression of fibrotic changes with no new changes of honeycombing and UIP pattern.  Differential diagnoses include IPF, chronic hypersensitivity pneumonitis as he is exposed to paint in the past and has ongoing exposure to moldy wood through his woodworking hobby though he maintains full protection with masks and ventilation while working with wood and he has been limiting his woodworking recently  Given progression of disease I have recommended that we treat with antifibrotics.  Discussion at multidisciplinary interdisciplinary conference  confirms diagnosis of IPF. He is prone to sunburning so will avoid Esbriet.  At a lower dose of Ofev of 100 mg twice daily due to GI symptoms. He is going to try 100 mg in the morning and 150 at night as he still has some 150 mg tablets leftover.  If he is doing well with this then we will send in a prescription for this regimen.  LFTs last month were within normal limits. Continue pulmonary rehab  Postnasal drip Start Nasonex, Zyrtec over-the-counter.  He is already on Claritin.  Lung nodule Stable on follow-up scan.  Plan/Recommendations: Try Ofev 100 mg morning and 150 at night. Nasonex, Zyrtec  Marshell Garfinkel MD [facility] 05/25/2022, 10:02 AM  CC: Reynold Bowen, MD

## 2022-05-25 NOTE — Patient Instructions (Signed)
I am glad you are tolerating the low-dose of Ofev Continue current therapy Continue pulmonary rehab You can use Nasonex and Zyrtec over-the-counter for postnasal drip Follow-up in 3 months.

## 2022-07-01 ENCOUNTER — Ambulatory Visit: Payer: Medicare HMO | Attending: Cardiovascular Disease | Admitting: Cardiovascular Disease

## 2022-07-01 ENCOUNTER — Encounter: Payer: Self-pay | Admitting: Cardiovascular Disease

## 2022-07-01 VITALS — BP 130/80 | HR 109 | Ht 70.5 in | Wt 209.0 lb

## 2022-07-01 DIAGNOSIS — I10 Essential (primary) hypertension: Secondary | ICD-10-CM

## 2022-07-01 DIAGNOSIS — E782 Mixed hyperlipidemia: Secondary | ICD-10-CM | POA: Diagnosis not present

## 2022-07-01 NOTE — Patient Instructions (Signed)
   Follow-Up: At [facility], you and your health needs are our priority.  As part of our continuing mission to provide you with exceptional heart care, we have created designated Provider Care Teams.  These Care Teams include your primary Cardiologist (physician) and Advanced Practice Providers (APPs -  Physician Assistants and Nurse Practitioners) who all work together to provide you with the care you need, when you need it.  We recommend signing up for the patient portal called "MyChart".  Sign up information is provided on this After Visit Summary.  MyChart is used to connect with patients for Virtual Visits (Telemedicine).  Patients are able to view lab/test results, encounter notes, upcoming appointments, etc.  Non-urgent messages can be sent to your provider as well.   To learn more about what you can do with MyChart, go to [email or web address].    Your next appointment:   12 month(s)  The format for your next appointment:   In Person  Provider:   Quay Burow MD

## 2022-07-01 NOTE — Assessment & Plan Note (Signed)
History of essential hypertension blood pressure measured today at 130/80.  He is on lisinopril.

## 2022-07-01 NOTE — Progress Notes (Signed)
07/01/2022 [name]   1946-04-24  [id number]  Primary Physician Reynold Bowen, MD Primary Cardiologist: Lorretta Harp MD Lupe Carney, [place]  HPI:  Randall Horton is a 77 y.o.  moderately overweight married Caucasian male father of 1 son, grandfather and one grandchild referred by Dr. Forde Dandy for cardiovascular evaluation because of known risk factors.  I last saw him in the office 03/20/2020.  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  I last saw him in the office 08/24/2018. He does have a history of treated hypertension, diabetes and hyperlipidemia.  He had a mild stroke in the past but is never had a heart attack.  He has occasional shortness of breath but denies chest pain.  He had a normal Myoview 04/13/2012 and a normal 2D echo 07/30/2016.    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  He does admit to being somewhat deconditioned and complains of some shortness of breath.  Also complains of some right hip and leg pain but denies chest pain.  We obtain lower extremity arterial Doppler studies on him 08/30/2019 which were normal.   Since I saw him over 2 years ago he has done well except for new diagnosis of IPF followed by Dr. Vaughan Browner.  He is on some medications for this.  He exercises 3 days a week for an hour at a time without limitation.  He currently is not on O2.  He denies chest pain.   Current Meds  Medication Sig   ACCU-CHEK GUIDE test strip USE TO SELF MONITOR BLOOD GLUCOSE UP TO 10 TIMES DAILY (E11.65)   acetaminophen (TYLENOL) 500 MG tablet Take 1,000 mg by mouth every 6 (six) hours as needed for moderate pain or headache.   allopurinol (ZYLOPRIM) 300 MG tablet Take 300 mg by mouth every other day.   Ascorbic Acid (VITAMIN C) 500 MG CHEW Chew 1 tablet by mouth daily.   clopidogrel (PLAVIX) 75 MG tablet Take 75 mg by mouth daily.   clotrimazole (MYCELEX) 10 MG troche DISSOLVE 1 TABLET UP TO 5 TIMES A DAY    Continuous Blood Gluc Sensor (FREESTYLE LIBRE 14 DAY SENSOR) MISC    diphenhydrAMINE (BENADRYL) 25 MG tablet Take 25 mg by mouth at bedtime as needed for itching (around pump site.).   docusate sodium (COLACE) 100 MG capsule Take 100 mg by mouth 2 (two) times daily as needed for mild constipation.   fenofibrate 160 MG tablet Take 160 mg by mouth every evening.   gabapentin (NEURONTIN) 300 MG capsule 300 mg every evening.    hydrocortisone 1 % ointment Apply topically daily. APPLY TO EARS    Insulin Human (INSULIN PUMP) SOLN Inject 110-120 each into the skin daily. Novolog.   lisinopril (ZESTRIL) 10 MG tablet Take 10 mg by mouth daily.   loratadine (CLARITIN) 10 MG tablet Take 10 mg by mouth 2 (two) times daily.   Multiple Vitamin (MULTIVITAMIN) tablet Take 1 tablet by mouth daily.   Nintedanib (OFEV) 100 MG CAPS Take 1 capsule (100 mg total) by mouth 2 (two) times daily. **LOWER DOSE**   NOVOLOG 100 UNIT/ML injection PER PUMP PATIENT USES 120 UNITS PER DAY DX  250.50   Omega 3 1000 MG CAPS Take by mouth.   omeprazole (PRILOSEC OTC) 20 MG tablet Take 20 mg by mouth every morning.   tamsulosin (FLOMAX) 0.4 MG CAPS capsule Take 0.4 mg  by mouth daily after supper.   traMADol (ULTRAM) 50 MG tablet Take by mouth every 6 (six) hours as needed.   Vitamin D, Cholecalciferol, 50 MCG (2000 UT) CAPS Take 1 capsule by mouth daily.     Allergies  Allergen Reactions   Aspirin Other (See Comments)    Severe stomach cramps + constipation.    Niacin And Related Other (See Comments)    "flushing"   Penicillins Hives, Itching and Swelling    Has patient had a PCN reaction causing immediate rash, facial/tongue/throat swelling, SOB or lightheadedness with hypotension: YES Has patient had a PCN reaction causing severe rash involving mucus membranes or skin necrosis: NO Has patient had a PCN reaction that required hospitalization NO Has patient had a PCN reaction occurring within the last 10 years: NO If all  of the above answers are "NO", then may proceed with Cephalosporin use.   Tamiflu [Oseltamivir Phosphate] Rash    Social History   Socioeconomic History   Marital status: Married    Spouse name: Not on file   Number of children: Not on file   Years of education: Not on file   Highest education level: Not on file  Occupational History   Occupation: retired  Tobacco Use   Smoking status: Former    Packs/day: 1.00    Years: 14.00    Total pack years: 14.00    Types: Cigarettes    Quit date: 06/22/1977    Years since quitting: 45.0   Smokeless tobacco: Current    Types: Snuff  Vaping Use   Vaping Use: Never used  Substance and Sexual Activity   Alcohol use: No    Alcohol/week: 0.0 standard drinks of alcohol   Drug use: No   Sexual activity: Not Currently  Other Topics Concern   Not on file  Social History Narrative   Not on file   Social Determinants of Health   Financial Resource Strain: Not on file  Food Insecurity: Not on file  Transportation Needs: Not on file  Physical Activity: Not on file  Stress: Not on file  Social Connections: Not on file  Intimate Partner Violence: Not on file     Review of Systems: General: negative for chills, fever, night sweats or weight changes.  Cardiovascular: negative for chest pain, dyspnea on exertion, edema, orthopnea, palpitations, paroxysmal nocturnal dyspnea or shortness of breath Dermatological: negative for rash Respiratory: negative for cough or wheezing Urologic: negative for hematuria Abdominal: negative for nausea, vomiting, diarrhea, bright red blood per rectum, melena, or hematemesis Neurologic: negative for visual changes, syncope, or dizziness All other systems reviewed and are otherwise negative except as noted above.    Blood pressure 130/80, pulse (!) 109, height 5' 10.5" (1.791 m), weight 209 lb (94.8 kg).  General appearance: alert and no distress Neck: no adenopathy, no carotid bruit, no JVD, supple,  symmetrical, trachea midline, and thyroid not enlarged, symmetric, no tenderness/mass/nodules Lungs: clear to auscultation bilaterally Heart: regular rate and rhythm, S1, S2 normal, no murmur, click, rub or gallop Extremities: extremities normal, atraumatic, no cyanosis or edema Pulses: 2+ and symmetric Skin: Skin color, texture, turgor normal. No rashes or lesions Neurologic: Grossly normal  EKG sinus tachycardia at 109 without ST or T wave changes.  Personally reviewed this EKG.  ASSESSMENT AND PLAN:   Essential hypertension History of essential hypertension blood pressure measured today at 130/80.  He is on lisinopril.  Hyperlipidemia History of hyperlipidemia on atorvastatin with lipid profile performed 09/01/2021 revealing total  cholesterol 145, LDL 89 HDL 24.     Lorretta Harp MD [facility], [facility] 07/01/2022 2:39 PM

## 2022-07-01 NOTE — Assessment & Plan Note (Signed)
History of hyperlipidemia on atorvastatin with lipid profile performed 09/01/2021 revealing total cholesterol 145, LDL 89 HDL 24.

## 2022-08-04 ENCOUNTER — Ambulatory Visit: Payer: Medicare HMO | Admitting: Cardiovascular Disease

## 2022-09-28 ENCOUNTER — Ambulatory Visit (INDEPENDENT_AMBULATORY_CARE_PROVIDER_SITE_OTHER): Payer: Medicare HMO | Admitting: Pulmonary Disease

## 2022-09-28 ENCOUNTER — Encounter: Payer: Self-pay | Admitting: Pulmonary Disease

## 2022-09-28 VITALS — BP 166/66 | HR 95 | Temp 98.0°F | Ht 70.5 in | Wt 205.6 lb

## 2022-09-28 DIAGNOSIS — J849 Interstitial pulmonary disease, unspecified: Secondary | ICD-10-CM

## 2022-09-28 DIAGNOSIS — Z5181 Encounter for therapeutic drug level monitoring: Secondary | ICD-10-CM | POA: Diagnosis not present

## 2022-09-28 NOTE — Patient Instructions (Signed)
Will check comprehensive metabolic panel today Start paperwork for Esbriet Follow-up in 3 months

## 2022-09-28 NOTE — Progress Notes (Signed)
Randall Horton    [id number]    [birth year]  Primary Care Physician:South, Jeannett Senior, MD  Referring Physician: Adrian Prince, MD [address] [place],  [place] [address]  Chief complaint:  Follow-up for IPF Ofev started July 2023, dose reduced September 2023  HPI: 77 y.o.  with history of hypertension, type 1 diabetes, hyperlipidemia, stroke, allergies.  He recently had a CT abdomen for work-up of abdominal pain which showed abnormal lungs Follow-up high-res CT showed pulmonary fibrosis and lung nodule and has been referred here for further evaluation  He does not have mild dyspnea on exertion with occasional sneezing and cough. History notable for GERD, hiatal hernia, exposures as below  Discussed at [facility] ILD conference 02/06/22 CT is LATEST SCAN: latest is subpleural predomm early HC, , bilateral bibasal reticlation , No AT,, CCG +. Latest scan is definite UIP. OLD scan was called ALTERNATE because of som e nodularity that in retrospect was transient. Agree with some progression. 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  He continues to do woodwork as a hobby and is exposed to moldy wood dust ILD questionnaire 10/22/2021-as above Exposures: As above Smoking history: 20-pack-year smoker.  Quit in 1979 Travel history: No significant travel history Relevant family history: No family history of lung disease  Interim history: Started Ofev on on 01/16/2022. Initially it was well tolerated but has progressive stomach discomfort and diarrhea that did not get better with immodium and mylanta.  Dose reduced to 100 mg during the morning and 150 at night and then 100 mg twice daily but he still continues to have symptoms and is off this medication altogether  He wants to consider Esbriet  Outpatient Encounter Medications as of 09/28/2022  Medication Sig    ACCU-CHEK GUIDE test strip USE TO SELF MONITOR BLOOD GLUCOSE UP TO 10 TIMES DAILY (E11.65)   acetaminophen (TYLENOL) 500 MG tablet Take 1,000 mg by mouth every 6 (six) hours as needed for moderate pain or headache.   allopurinol (ZYLOPRIM) 300 MG tablet Take 300 mg by mouth every other day.   Ascorbic Acid (VITAMIN C) 500 MG CHEW Chew 1 tablet by mouth daily.   clopidogrel (PLAVIX) 75 MG tablet Take 75 mg by mouth daily.   clotrimazole (MYCELEX) 10 MG troche DISSOLVE 1 TABLET UP TO 5 TIMES A DAY   Continuous Blood Gluc Sensor (FREESTYLE LIBRE 14 DAY SENSOR) MISC    diphenhydrAMINE (BENADRYL) 25 MG tablet Take 25 mg by mouth at bedtime as needed for itching (around pump site.).   docusate sodium (COLACE) 100 MG capsule Take 100 mg by mouth 2 (two) times daily as needed for mild constipation.   fenofibrate 160 MG tablet Take 160 mg by mouth every evening.   gabapentin (NEURONTIN) 300 MG capsule 300 mg every evening.    hydrocortisone 1 % ointment Apply topically daily. APPLY TO EARS    Insulin Human (INSULIN PUMP) SOLN Inject 110-120 each into the skin daily. Novolog.   lisinopril (ZESTRIL) 10 MG tablet Take 10 mg by mouth daily.   loratadine (CLARITIN) 10 MG tablet Take 10 mg by mouth 2 (two) times daily.   Multiple Vitamin (MULTIVITAMIN) tablet Take 1 tablet by mouth daily.   NOVOLOG 100 UNIT/ML injection PER PUMP PATIENT USES 120 UNITS PER DAY DX  250.50   omeprazole (PRILOSEC OTC) 20 MG  tablet Take 20 mg by mouth every morning.   tamsulosin (FLOMAX) 0.4 MG CAPS capsule Take 0.4 mg by mouth daily after supper.   traMADol (ULTRAM) 50 MG tablet Take by mouth every 6 (six) hours as needed.   Vitamin D, Cholecalciferol, 50 MCG (2000 UT) CAPS Take 1 capsule by mouth daily.   atorvastatin (LIPITOR) 40 MG tablet Take 1 tablet (40 mg total) by mouth daily at 6 PM.   Nintedanib (OFEV) 100 MG CAPS Take 1 capsule (100 mg total) by mouth 2 (two) times daily. **LOWER DOSE** (Patient not taking: Reported on  09/28/2022)   [DISCONTINUED] Omega 3 1000 MG CAPS Take by mouth.   No facility-administered encounter medications on file as of 09/28/2022.   Physical Exam: Blood pressure (!) 166/66, pulse 95, temperature 98 F (36.7 C), temperature source Oral, height 5' 10.5" (1.791 m), weight 205 lb 9.6 oz (93.3 kg), SpO2 97 %. Gen:      No acute distress HEENT:  EOMI, sclera anicteric Neck:     No masses; no thyromegaly Lungs:   Bibasal crackles CV:         Regular rate and rhythm; no murmurs Abd:      + bowel sounds; soft, non-tender; no palpable masses, no distension Ext:    No edema; adequate peripheral perfusion Skin:      Warm and dry; no rash Neuro: alert and oriented x 3 Psych: normal mood and affect   Data Reviewed: Imaging: CT chest 05/21/2021-mild pulmonary fibrosis in apical to basal gradient with peripheral bronchiolectasis, centrilobular nodularity in the upper lobes.  Differential diagnosis is chronic HP, alternate diagnosis. New spiculated nodule in the left pulmonary apex.    CT high-resolution 11/14/2021-slight interval progression of fibrotic lung disease now in UIP pattern.  I have reviewed the images personally  PFTs: 12/11/2021 FVC 3.75 [90%], FEV1 3.32 [111%], F/F 89, TLC 4.90 [69%], DLCO 14.86 [59%] Mild restriction, moderate diffusion defect  Labs: CTD serologies 10/22/2021-positive for ANA 1:40 CMP 02/16/2022- WNL  Assessment:  Evaluation for interstitial lung disease His CT shows progression of fibrotic changes with no new changes of honeycombing and UIP pattern.  Differential diagnoses include IPF, chronic hypersensitivity pneumonitis as he is exposed to paint in the past and has ongoing exposure to moldy wood through his woodworking hobby though he maintains full protection with masks and ventilation while working with wood and he has been limiting his woodworking recently  Given progression of disease I have recommended that we treat with antifibrotics.  Discussion at  multidisciplinary interdisciplinary conference confirms diagnosis of IPF.  He is unable to tolerate Ofev due to GI symptoms.  Although he is prone to sunburning he is willing to try Esbriet and we will start paperwork for this Check LFTs.  Postnasal drip Nasonex, Zyrtec over-the-counter.  He is already on Claritin.  Lung nodule Stable on follow-up scan.  Plan/Recommendations: Discontinue Ofev, start paperwork for this. Check LFTs Nasonex, Zyrtec  Chilton Greathouse MD [facility] 09/28/2022, 3:29 PM  CC: Adrian Prince, MD

## 2022-09-29 LAB — COMPREHENSIVE METABOLIC PANEL
ALT: 16 U/L (ref 0–53)
AST: 17 U/L (ref 0–37)
Albumin: 4.3 g/dL (ref 3.5–5.2)
Alkaline Phosphatase: 59 U/L (ref 39–117)
BUN: 26 mg/dL — ABNORMAL HIGH (ref 6–23)
CO2: 27 mEq/L (ref 19–32)
Calcium: 9.4 mg/dL (ref 8.4–10.5)
Chloride: 104 mEq/L (ref 96–112)
Creatinine, Ser: 1.69 mg/dL — ABNORMAL HIGH (ref 0.40–1.50)
GFR: 38.42 mL/min — ABNORMAL LOW (ref >60.00)
Glucose, Bld: 163 mg/dL — ABNORMAL HIGH (ref 70–99)
Potassium: 4.6 mEq/L (ref 3.5–5.1)
Sodium: 139 mEq/L (ref 135–145)
Total Bilirubin: 0.5 mg/dL (ref 0.2–1.2)
Total Protein: 6.5 g/dL (ref 6.0–8.3)

## 2022-10-12 ENCOUNTER — Telehealth: Payer: Self-pay

## 2022-10-12 NOTE — Telephone Encounter (Signed)
Received New start paperwork for ESBRIET. Pt previously tried and failed Ofev, will proceed with PA submission for name-brand Esbriet so that pt may be eligible for PAP. Will update as we work through the benefits process.  Submitted a Prior Authorization request to [facility] for ESBRIET via CoverMyMeds. Will update once we receive a response.  Key: [id number]

## 2022-10-14 ENCOUNTER — Other Ambulatory Visit (HOSPITAL_COMMUNITY): Payer: Self-pay

## 2022-10-14 NOTE — Telephone Encounter (Signed)
Received notification from [facility] regarding a prior authorization for ESBRIET. Authorization has been APPROVED from 06/22/2022 to 06/22/2023. Approval letter sent to scan center.  Per test claim, copay for 207 tablets as a 30 day supply is $155. The generic pirfenidone is NOT included under this authorization number and a new PA will need to submitted if denied PAP.  Authorization # [id number]    Submitted Patient Assistance Application to [facility] for ESBRIET. Will update patient when we receive a response.  Phone #: [phone] Fax #: [phone]

## 2022-10-20 NOTE — Telephone Encounter (Signed)
Received call from patient requesting update on Esbriet status. Advised patient of copay and that we are waiting to hear back from patient assistance program. He verbalized understanding   Randall Horton, PharmD, MPH, BCPS, CPP Clinical Pharmacist (Rheumatology and Pulmonology)

## 2022-10-22 ENCOUNTER — Other Ambulatory Visit: Payer: Self-pay | Admitting: Nephrology

## 2022-10-22 DIAGNOSIS — N1832 Chronic kidney disease, stage 3b: Secondary | ICD-10-CM

## 2022-10-26 ENCOUNTER — Ambulatory Visit
Admission: RE | Admit: 2022-10-26 | Discharge: 2022-10-26 | Disposition: A | Discharge status: Home / Self Care | Payer: Medicare HMO | Source: Ambulatory Visit | Attending: Nephrology | Admitting: Nephrology

## 2022-10-26 DIAGNOSIS — N1832 Chronic kidney disease, stage 3b: Secondary | ICD-10-CM

## 2022-10-27 NOTE — Telephone Encounter (Signed)
Received a fax from [employer] regarding an approval for Esbriet patient assistance from 10/27/2022 until patient no longer qualifies due to discontinuation of therapy, changes to patient's current financial status or health insurance and/or patient no longer meets the program eligibility requirements.  Called pt and learned that a voicemail had been left by a [employer] rep providing phone numbers and discussing next steps. Urged pt to follow back up and complete his welcome call. Also requested that, if he should have any issues or further questions, to call the clinic and ask to speak to the pharmacy team. Pt verbalized understanding to all.  Relevant Documents have been sent to scan center.  [employer] Phone#: [phone] option 5 [employer] Phone#: [phone]

## 2022-11-09 ENCOUNTER — Telehealth: Payer: Self-pay | Admitting: Pharmacist

## 2022-11-09 DIAGNOSIS — Z5181 Encounter for therapeutic drug level monitoring: Secondary | ICD-10-CM

## 2022-11-09 DIAGNOSIS — J849 Interstitial pulmonary disease, unspecified: Secondary | ICD-10-CM

## 2022-11-09 MED ORDER — ESBRIET 267 MG PO TABS
801.0000 mg | ORAL_TABLET | Freq: Three times a day (TID) | ORAL | 1 refills | Status: DC
Start: 2022-11-09 — End: 2023-02-24

## 2022-11-09 MED ORDER — ESBRIET 267 MG PO TABS
ORAL_TABLET | ORAL | 0 refills | Status: DC
Start: 2022-11-09 — End: 2023-02-24

## 2022-11-09 NOTE — Telephone Encounter (Signed)
Received voicemail from patient stating he started Esbriet on 11/07/22. Tolerating well thus far.  He switched from omeprazole to pantoprazole d/t kidney function but this is not working as well to control his GERD. He has reached out to PCP for recs.  Chesley Mires, PharmD, MPH, BCPS, CPP Clinical Pharmacist (Rheumatology and Pulmonology)

## 2023-02-11 ENCOUNTER — Ambulatory Visit (INDEPENDENT_AMBULATORY_CARE_PROVIDER_SITE_OTHER): Payer: Medicare HMO | Admitting: Pulmonary Disease

## 2023-02-11 ENCOUNTER — Encounter: Payer: Self-pay | Admitting: Pulmonary Disease

## 2023-02-11 VITALS — BP 110/50 | HR 76 | Ht 70.5 in | Wt 202.0 lb

## 2023-02-11 DIAGNOSIS — I272 Pulmonary hypertension, unspecified: Secondary | ICD-10-CM | POA: Diagnosis not present

## 2023-02-11 DIAGNOSIS — Z5181 Encounter for therapeutic drug level monitoring: Secondary | ICD-10-CM

## 2023-02-11 DIAGNOSIS — J849 Interstitial pulmonary disease, unspecified: Secondary | ICD-10-CM

## 2023-02-11 NOTE — Progress Notes (Deleted)
Randall Horton    [id number]    [birth year]  Primary Care Physician:South, Jeannett Senior, MD  Referring Physician: Adrian Prince, MD [address] [place],  [place] [address]  Chief complaint:  Follow-up for IPF Ofev started July 2023, dose reduced September 2023  HPI: 77 y.o.  with history of hypertension, type 1 diabetes, hyperlipidemia, stroke, allergies.  He recently had a CT abdomen for work-up of abdominal pain which showed abnormal lungs Follow-up high-res CT showed pulmonary fibrosis and lung nodule and has been referred here for further evaluation  He does not have mild dyspnea on exertion with occasional sneezing and cough. History notable for GERD, hiatal hernia, exposures as below  Discussed at [facility] ILD conference 02/06/22 CT is LATEST SCAN: latest is subpleural predomm early HC, , bilateral bibasal reticlation , No AT,, CCG +. Latest scan is definite UIP. OLD scan was called ALTERNATE because of som e nodularity that in retrospect was transient. Agree with some progression. 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  He continues to do woodwork as a hobby and is exposed to moldy wood dust ILD questionnaire 10/22/2021-as above Exposures: As above Smoking history: 20-pack-year smoker.  Quit in 1979 Travel history: No significant travel history Relevant family history: No family history of lung disease  Interim history: Started Ofev on on 01/16/2022. Initially it was well tolerated but has progressive stomach discomfort and diarrhea that did not get better with immodium and mylanta.  Dose reduced to 100 mg during the morning and 150 at night and then 100 mg twice daily but he still continues to have symptoms and is off this medication altogether  He wants to consider Esbriet  Outpatient Encounter Medications as of 02/11/2023  Medication Sig    ACCU-CHEK GUIDE test strip USE TO SELF MONITOR BLOOD GLUCOSE UP TO 10 TIMES DAILY (E11.65)   acetaminophen (TYLENOL) 500 MG tablet Take 1,000 mg by mouth every 6 (six) hours as needed for moderate pain or headache.   ADMELOG 100 UNIT/ML injection Inject 12 mLs into the skin daily.   allopurinol (ZYLOPRIM) 300 MG tablet Take 300 mg by mouth every other day.   Ascorbic Acid (VITAMIN C) 500 MG CHEW Chew 1 tablet by mouth daily.   clopidogrel (PLAVIX) 75 MG tablet Take 75 mg by mouth daily.   clotrimazole (MYCELEX) 10 MG troche DISSOLVE 1 TABLET UP TO 5 TIMES A DAY   Continuous Blood Gluc Sensor (FREESTYLE LIBRE 14 DAY SENSOR) MISC    diphenhydrAMINE (BENADRYL) 25 MG tablet Take 25 mg by mouth at bedtime as needed for itching (around pump site.).   docusate sodium (COLACE) 100 MG capsule Take 100 mg by mouth 2 (two) times daily as needed for mild constipation.   ESBRIET 267 MG TABS Take 1 tab three times daily for 7 days, then 2 tabs three times daily for 7 days, then 3 tabs three times daily thereafter.   ESBRIET 267 MG TABS Take 3 tablets (801 mg total) by mouth 3 (three) times daily with meals.   fenofibrate 160 MG tablet Take 160 mg by mouth every evening.   gabapentin (NEURONTIN) 300 MG capsule 300 mg every evening.    hydrocortisone 1 % ointment Apply topically daily. APPLY TO EARS    Insulin Human (INSULIN PUMP) SOLN Inject 110-120 each into the skin daily. Novolog.  LEXAPRO 10 MG tablet Take 10 mg by mouth daily.   lisinopril (ZESTRIL) 10 MG tablet Take 10 mg by mouth daily.   loratadine (CLARITIN) 10 MG tablet Take 10 mg by mouth 2 (two) times daily.   Multiple Vitamin (MULTIVITAMIN) tablet Take 1 tablet by mouth daily.   NOVOLOG 100 UNIT/ML injection PER PUMP PATIENT USES 120 UNITS PER DAY DX  250.50   omeprazole (PRILOSEC OTC) 20 MG tablet Take 20 mg by mouth every morning.   tamsulosin (FLOMAX) 0.4 MG CAPS capsule Take 0.4 mg by mouth daily after supper.   traMADol (ULTRAM) 50 MG  tablet Take by mouth every 6 (six) hours as needed.   Vitamin D, Cholecalciferol, 50 MCG (2000 UT) CAPS Take 1 capsule by mouth daily.   atorvastatin (LIPITOR) 40 MG tablet Take 1 tablet (40 mg total) by mouth daily at 6 PM.   No facility-administered encounter medications on file as of 02/11/2023.   Physical Exam: Blood pressure (!) 166/66, pulse 95, temperature 98 F (36.7 C), temperature source Oral, height 5' 10.5" (1.791 m), weight 205 lb 9.6 oz (93.3 kg), SpO2 97 %. Gen:      No acute distress HEENT:  EOMI, sclera anicteric Neck:     No masses; no thyromegaly Lungs:   Bibasal crackles CV:         Regular rate and rhythm; no murmurs Abd:      + bowel sounds; soft, non-tender; no palpable masses, no distension Ext:    No edema; adequate peripheral perfusion Skin:      Warm and dry; no rash Neuro: alert and oriented x 3 Psych: normal mood and affect   Data Reviewed: Imaging: CT chest 05/21/2021-mild pulmonary fibrosis in apical to basal gradient with peripheral bronchiolectasis, centrilobular nodularity in the upper lobes.  Differential diagnosis is chronic HP, alternate diagnosis. New spiculated nodule in the left pulmonary apex.    CT high-resolution 11/14/2021-slight interval progression of fibrotic lung disease now in UIP pattern.  I have reviewed the images personally  PFTs: 12/11/2021 FVC 3.75 [90%], FEV1 3.32 [111%], F/F 89, TLC 4.90 [69%], DLCO 14.86 [59%] Mild restriction, moderate diffusion defect  Labs: CTD serologies 10/22/2021-positive for ANA 1:40 CMP 02/16/2022- WNL  Assessment:  Evaluation for interstitial lung disease His CT shows progression of fibrotic changes with no new changes of honeycombing and UIP pattern.  Differential diagnoses include IPF, chronic hypersensitivity pneumonitis as he is exposed to paint in the past and has ongoing exposure to moldy wood through his woodworking hobby though he maintains full protection with masks and ventilation while  working with wood and he has been limiting his woodworking recently  Given progression of disease I have recommended that we treat with antifibrotics.  Discussion at multidisciplinary interdisciplinary conference confirms diagnosis of IPF.  He is unable to tolerate Ofev due to GI symptoms.  Although he is prone to sunburning he is willing to try Esbriet and we will start paperwork for this Check LFTs.  Postnasal drip Nasonex, Zyrtec over-the-counter.  He is already on Claritin.  Lung nodule Stable on follow-up scan.  Plan/Recommendations: Discontinue Ofev, start paperwork for this. Check LFTs Nasonex, Zyrtec  Chilton Greathouse MD [facility] 02/11/2023, 9:41 AM  CC: Adrian Prince, MD

## 2023-02-11 NOTE — Progress Notes (Addendum)
Randall Horton    [id number]    [birth year]  Primary Care Physician:South, Jeannett Senior, MD  Referring Physician: Adrian Prince, MD [address] [place],  [place] [address]  Chief complaint:  Follow-up for IPF Ofev started July 2023, dose reduced September 2023 due to side effects Switch to Esbriet May 2024  HPI: 77 y.o.  with history of hypertension, type 1 diabetes, hyperlipidemia, stroke, allergies.    Referred for evaluation of pulmonary fibrosis with cyst discovered incidentally on CT abdomen. Follow up CT of the chest shows definite UIP pattern.  Discussed at [facility] ILD conference 02/06/22 CT is LATEST SCAN: latest is subpleural predomm early HC, , bilateral bibasal reticlation , No AT,, CCG +. Latest scan is definite UIP. OLD scan was called ALTERNATE because of som e nodularity that in retrospect was transient. Agree with some progression. DX - Clinical IPF - white male progression UIP and woodwork  Started Ofev on on 01/16/2022. Initially it was well tolerated but has progressive stomach discomfort and diarrhea that did not get better with immodium and mylanta.  Dose reduction was attempted but he still continued to have symptoms.  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  He continues to do woodwork as a hobby and is exposed to moldy wood dust ILD questionnaire 10/22/2021-as above Exposures: As above Smoking history: 20-pack-year smoker.  Quit in 1979 Travel history: No significant travel history Relevant family history: No family history of lung disease  Interim history: Switch to Esbriet in May 2024 since Ofev was poorly tolerated. He is doing much better with the new medication with minimal side effects. Continues to have dyspnea on exertion at baseline.  Outpatient Encounter Medications as of 02/11/2023  Medication Sig   ACCU-CHEK GUIDE test strip USE TO SELF MONITOR BLOOD  GLUCOSE UP TO 10 TIMES DAILY (E11.65)   acetaminophen (TYLENOL) 500 MG tablet Take 1,000 mg by mouth every 6 (six) hours as needed for moderate pain or headache.   ADMELOG 100 UNIT/ML injection Inject 12 mLs into the skin daily.   allopurinol (ZYLOPRIM) 300 MG tablet Take 300 mg by mouth every other day.   Ascorbic Acid (VITAMIN C) 500 MG CHEW Chew 1 tablet by mouth daily.   clopidogrel (PLAVIX) 75 MG tablet Take 75 mg by mouth daily.   clotrimazole (MYCELEX) 10 MG troche DISSOLVE 1 TABLET UP TO 5 TIMES A DAY   Continuous Blood Gluc Sensor (FREESTYLE LIBRE 14 DAY SENSOR) MISC    diphenhydrAMINE (BENADRYL) 25 MG tablet Take 25 mg by mouth at bedtime as needed for itching (around pump site.).   docusate sodium (COLACE) 100 MG capsule Take 100 mg by mouth 2 (two) times daily as needed for mild constipation.   ESBRIET 267 MG TABS Take 1 tab three times daily for 7 days, then 2 tabs three times daily for 7 days, then 3 tabs three times daily thereafter.   ESBRIET 267 MG TABS Take 3 tablets (801 mg total) by mouth 3 (three) times daily with meals.   fenofibrate 160 MG tablet Take 160 mg by mouth every evening.   gabapentin (NEURONTIN) 300 MG capsule 300 mg every evening.    hydrocortisone 1 % ointment Apply topically daily. APPLY TO EARS    Insulin Human (INSULIN PUMP) SOLN Inject 110-120 each into the skin daily. Novolog.   LEXAPRO 10 MG tablet Take 10 mg by mouth daily.  lisinopril (ZESTRIL) 10 MG tablet Take 10 mg by mouth daily.   loratadine (CLARITIN) 10 MG tablet Take 10 mg by mouth 2 (two) times daily.   Multiple Vitamin (MULTIVITAMIN) tablet Take 1 tablet by mouth daily.   NOVOLOG 100 UNIT/ML injection PER PUMP PATIENT USES 120 UNITS PER DAY DX  250.50   omeprazole (PRILOSEC OTC) 20 MG tablet Take 20 mg by mouth every morning.   tamsulosin (FLOMAX) 0.4 MG CAPS capsule Take 0.4 mg by mouth daily after supper.   traMADol (ULTRAM) 50 MG tablet Take by mouth every 6 (six) hours as needed.    Vitamin D, Cholecalciferol, 50 MCG (2000 UT) CAPS Take 1 capsule by mouth daily.   atorvastatin (LIPITOR) 40 MG tablet Take 1 tablet (40 mg total) by mouth daily at 6 PM.   No facility-administered encounter medications on file as of 02/11/2023.   Physical Exam: Blood pressure (!) 166/66, pulse 95, temperature 98 F (36.7 C), temperature source Oral, height 5' 10.5" (1.791 m), weight 205 lb 9.6 oz (93.3 kg), SpO2 97 %. Gen:      No acute distress HEENT:  EOMI, sclera anicteric Neck:     No masses; no thyromegaly Lungs:   Bibasal crackles CV:         Regular rate and rhythm; no murmurs Abd:      + bowel sounds; soft, non-tender; no palpable masses, no distension Ext:    No edema; adequate peripheral perfusion Skin:      Warm and dry; no rash Neuro: alert and oriented x 3 Psych: normal mood and affect   Data Reviewed: Imaging: CT chest 05/21/2021-mild pulmonary fibrosis in apical to basal gradient with peripheral bronchiolectasis, centrilobular nodularity in the upper lobes.  Differential diagnosis is chronic HP, alternate diagnosis. New spiculated nodule in the left pulmonary apex.    CT high-resolution 11/14/2021-slight interval progression of fibrotic lung disease now in UIP pattern.  I have reviewed the images personally  PFTs: 12/11/2021 FVC 3.75 [90%], FEV1 3.32 [111%], F/F 89, TLC 4.90 [69%], DLCO 14.86 [59%] Mild restriction, moderate diffusion defect  Labs: CTD serologies 10/22/2021-positive for ANA 1:40 CMP 02/16/2022- WNL  Assessment:  Evaluation for interstitial lung disease His CT shows progression of fibrotic changes with no new changes of honeycombing and UIP pattern.  Differential diagnoses include IPF, chronic hypersensitivity pneumonitis as he is exposed to paint in the past and has ongoing exposure to moldy wood through his woodworking hobby though he maintains full protection with masks and ventilation while working with wood and he has been limiting his woodworking  recently  Given progression of disease I have recommended that we treat with antifibrotics.  Discussion at multidisciplinary interdisciplinary conference confirms diagnosis of IPF.  He is unable to tolerate Ofev due to GI symptoms.  Switch to Esbriet which is tolerated better  Reviewed labs from primary care, LFTs performed on 01/26/2023 with normal test.  Continue current therapy  Follow-up high-res CT and PFTs in 6 months.  Will order echocardiogram for evaluation of pulmonary hypertension  Postnasal drip Nasonex, Zyrtec over-the-counter.  He is already on Claritin.  Lung nodule Stable on follow-up scan.  Plan/Recommendations: Continue pirfenidone Monitor labs Echocardiogram High-res CT and PFTs in 6 months.  Chilton Greathouse MD [facility] 02/11/2023, 9:38 AM  CC: Adrian Prince, MD

## 2023-02-11 NOTE — Patient Instructions (Signed)
I am glad you are tolerating the new medication well Will order echocardiogram for evaluation of pulmonary hypertension Schedule high-res CT and PFTs in 6 months Follow-up in 6 months after CT and PFTs

## 2023-02-24 ENCOUNTER — Other Ambulatory Visit: Payer: Self-pay | Admitting: Pharmacist

## 2023-02-24 DIAGNOSIS — J849 Interstitial pulmonary disease, unspecified: Secondary | ICD-10-CM

## 2023-02-24 MED ORDER — PIRFENIDONE 801 MG PO TABS
801.0000 mg | ORAL_TABLET | Freq: Three times a day (TID) | ORAL | 1 refills | Status: DC
Start: 2023-02-24 — End: 2023-05-17

## 2023-02-24 NOTE — Telephone Encounter (Signed)
Refill sent for ESBRIET to [facility]) for Esbriet: [phone]  Dose: 801mg  three times daily. He reports he is tolerating well with transient dizziness and GI issue but takes with food. He is interested in switching to higher dose tablet to reduce pill burden  Last OV: 02/11/23 Provider: Dr. Isaiah Serge  Next OV: due in 6 months  Routing to scheduling team for follow-up on appt scheduling  Chesley Mires, PharmD, MPH, BCPS Clinical Pharmacist (Rheumatology and Pulmonology)

## 2023-03-05 ENCOUNTER — Ambulatory Visit (HOSPITAL_COMMUNITY): Payer: Medicare HMO | Attending: Pulmonary Disease

## 2023-03-05 DIAGNOSIS — I272 Pulmonary hypertension, unspecified: Secondary | ICD-10-CM | POA: Insufficient documentation

## 2023-03-05 LAB — ECHOCARDIOGRAM COMPLETE
Area-P 1/2: 3.54 cm2
S' Lateral: 3.5 cm

## 2023-05-17 ENCOUNTER — Telehealth: Payer: Self-pay | Admitting: Pharmacist

## 2023-05-17 DIAGNOSIS — J849 Interstitial pulmonary disease, unspecified: Secondary | ICD-10-CM

## 2023-05-17 MED ORDER — PIRFENIDONE 801 MG PO TABS: 801.0000 mg | ORAL_TABLET | Freq: Three times a day (TID) | ORAL | 1 refills | Status: DC

## 2023-05-17 NOTE — Telephone Encounter (Signed)
Received refill request for Esbriet from [facility]. He states he has labs completed regularly with nephrologist Dr. Signe Colt at [facility]. Will obtain lab results - left VM with Dr. Beaulah Corin medical assistant  Patient also states he was notified by [place] that he will no longer qualify for PAP in 2025. Went over changes to Medicare in 2025 and that we will be able to re-run benefits after 06/23/2023 and call him back thereafter.  Chesley Mires, PharmD, MPH, BCPS, CPP Clinical Pharmacist (Rheumatology and Pulmonology)

## 2023-05-18 ENCOUNTER — Other Ambulatory Visit: Payer: Self-pay | Admitting: Pharmacist

## 2023-05-18 DIAGNOSIS — Z5181 Encounter for therapeutic drug level monitoring: Secondary | ICD-10-CM

## 2023-05-18 DIAGNOSIS — J849 Interstitial pulmonary disease, unspecified: Secondary | ICD-10-CM

## 2023-05-18 NOTE — Progress Notes (Signed)
Received return call from [facility] - patient did not have LFTs checked in the past few months.  Spoke with patient - order for hepatic function panel placed. Patient will stop by clinic next week   Chesley Mires, PharmD, MPH, BCPS, CPP Clinical Pharmacist (Rheumatology and Pulmonology)

## 2023-05-19 ENCOUNTER — Other Ambulatory Visit (INDEPENDENT_AMBULATORY_CARE_PROVIDER_SITE_OTHER): Payer: Medicare HMO

## 2023-05-19 DIAGNOSIS — Z5181 Encounter for therapeutic drug level monitoring: Secondary | ICD-10-CM

## 2023-05-19 DIAGNOSIS — J849 Interstitial pulmonary disease, unspecified: Secondary | ICD-10-CM | POA: Diagnosis not present

## 2023-05-19 LAB — HEPATIC FUNCTION PANEL
ALT: 18 U/L (ref 0–53)
AST: 19 U/L (ref 0–37)
Albumin: 4.4 g/dL (ref 3.5–5.2)
Alkaline Phosphatase: 57 U/L (ref 39–117)
Bilirubin, Direct: 0.1 mg/dL (ref 0.0–0.3)
Total Bilirubin: 0.5 mg/dL (ref 0.2–1.2)
Total Protein: 6.8 g/dL (ref 6.0–8.3)

## 2023-07-05 ENCOUNTER — Ambulatory Visit: Payer: Medicare HMO | Attending: Cardiovascular Disease | Admitting: Cardiovascular Disease

## 2023-07-05 ENCOUNTER — Encounter: Payer: Self-pay | Admitting: Cardiovascular Disease

## 2023-07-05 VITALS — BP 114/66 | HR 90 | Ht 70.0 in | Wt 204.4 lb

## 2023-07-05 DIAGNOSIS — E782 Mixed hyperlipidemia: Secondary | ICD-10-CM | POA: Diagnosis not present

## 2023-07-05 DIAGNOSIS — I10 Essential (primary) hypertension: Secondary | ICD-10-CM

## 2023-07-05 NOTE — Progress Notes (Signed)
 07/05/2023 Randall Horton   1945/10/30  [id number]  Primary Physician Nichole Senior, MD Primary Cardiologist: Dorn JINNY Lesches MD [facility] [place], [place]  HPI:  Randall Horton is a 78 y.o.  moderately overweight married Caucasian male father of 1 son, grandfather and one grandchild referred by Dr. Nichole for cardiovascular evaluation because of known risk factors.  I last saw him in the office 07/01/2022.  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  I last saw him in the office 08/24/2018. He does have a history of treated hypertension, diabetes and hyperlipidemia.  He had a mild stroke in the past but is never had a heart attack.  He has occasional shortness of breath but denies chest pain.  He had a normal Myoview  04/13/2012 and a normal 2D echo 07/30/2016.    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  He does admit to being somewhat deconditioned and complains of some shortness of breath.  Also complains of some right hip and leg pain but denies chest pain.  We obtain lower extremity arterial Doppler studies on him 08/30/2019 which were normal.   Since I saw him in the office a year ago he continues to do well.  He does have shortness of breath related to his IPF followed by Dr. Theophilus.  He continues to exercise.  He denies chest pain.  He did stop his atorvastatin  because of statin related side effects.   Current Meds  Medication Sig   ACCU-CHEK GUIDE test strip USE TO SELF MONITOR BLOOD GLUCOSE UP TO 10 TIMES DAILY (E11.65)   acetaminophen  (TYLENOL ) 500 MG tablet Take 1,000 mg by mouth every 6 (six) hours as needed for moderate pain or headache.   ADMELOG 100 UNIT/ML injection Inject 12 mLs into the skin daily.   allopurinol  (ZYLOPRIM ) 300 MG tablet Take 300 mg by mouth every other day.   Ascorbic Acid (VITAMIN C) 500 MG CHEW Chew 1 tablet by mouth daily.   clopidogrel  (PLAVIX ) 75 MG tablet Take 75 mg by mouth daily.   clotrimazole   (MYCELEX ) 10 MG troche DISSOLVE 1 TABLET UP TO 5 TIMES A DAY   Continuous Glucose Receiver (DEXCOM G7 RECEIVER) DEVI by Does not apply route.   Continuous Glucose Sensor (DEXCOM G7 SENSOR) MISC by Does not apply route.   diphenhydrAMINE  (BENADRYL ) 25 MG tablet Take 25 mg by mouth at bedtime as needed for itching (around pump site.).   docusate sodium  (COLACE) 100 MG capsule Take 100 mg by mouth 2 (two) times daily as needed for mild constipation.   fenofibrate  160 MG tablet Take 160 mg by mouth every evening.   gabapentin  (NEURONTIN ) 300 MG capsule 300 mg every evening.    hydrocortisone  1 % ointment Apply topically daily. APPLY TO EARS    Insulin  Human (INSULIN  PUMP) SOLN Inject 110-120 each into the skin daily. Novolog .   LEXAPRO 10 MG tablet Take 10 mg by mouth daily.   lisinopril (ZESTRIL) 10 MG tablet Take 10 mg by mouth daily.   loratadine  (CLARITIN ) 10 MG tablet Take 10 mg by mouth 2 (two) times daily.   Multiple Vitamin (MULTIVITAMIN) tablet Take 1 tablet by mouth daily.   NOVOLOG  100 UNIT/ML injection PER PUMP PATIENT USES 120 UNITS PER DAY DX  250.50   omeprazole  (PRILOSEC  OTC) 20 MG tablet Take 20 mg by mouth every morning.   Pirfenidone  (ESBRIET ) 801 MG TABS  Take 1 tablet (801 mg total) by mouth 3 (three) times daily with meals.   silodosin (RAPAFLO) 8 MG CAPS capsule Take 8 mg by mouth at bedtime.   traMADol (ULTRAM) 50 MG tablet Take by mouth every 6 (six) hours as needed.   Vitamin D, Cholecalciferol, 50 MCG (2000 UT) CAPS Take 1 capsule by mouth daily.     Allergies  Allergen Reactions   Aspirin Other (See Comments)    Severe stomach cramps + constipation.    Niacin And Related Other (See Comments)    flushing   Penicillins Hives, Itching and Swelling    Has patient had a PCN reaction causing immediate rash, facial/tongue/throat swelling, SOB or lightheadedness with hypotension: YES Has patient had a PCN reaction causing severe rash involving mucus membranes or skin  necrosis: NO Has patient had a PCN reaction that required hospitalization NO Has patient had a PCN reaction occurring within the last 10 years: NO If all of the above answers are NO, then may proceed with Cephalosporin use.   Tamiflu [Oseltamivir Phosphate] Rash    Social History   Socioeconomic History   Marital status: Married    Spouse name: Not on file   Number of children: Not on file   Years of education: Not on file   Highest education level: Not on file  Occupational History   Occupation: retired  Tobacco Use   Smoking status: Former    Current packs/day: 0.00    Average packs/day: 1 pack/day for 14.0 years (14.0 ttl pk-yrs)    Types: Cigarettes    Start date: 06/23/1963    Quit date: 06/22/1977    Years since quitting: 46.0   Smokeless tobacco: Current    Types: Snuff  Vaping Use   Vaping status: Never Used  Substance and Sexual Activity   Alcohol  use: No    Alcohol /week: 0.0 standard drinks of alcohol    Drug use: No   Sexual activity: Not Currently  Other Topics Concern   Not on file  Social History Narrative   Not on file   Social Drivers of Health   Financial Resource Strain: Not on file  Food Insecurity: Not on file  Transportation Needs: Not on file  Physical Activity: Not on file  Stress: Not on file  Social Connections: Not on file  Intimate Partner Violence: Not on file     Review of Systems: General: negative for chills, fever, night sweats or weight changes.  Cardiovascular: negative for chest pain, dyspnea on exertion, edema, orthopnea, palpitations, paroxysmal nocturnal dyspnea or shortness of breath Dermatological: negative for rash Respiratory: negative for cough or wheezing Urologic: negative for hematuria Abdominal: negative for nausea, vomiting, diarrhea, bright red blood per rectum, melena, or hematemesis Neurologic: negative for visual changes, syncope, or dizziness All other systems reviewed and are otherwise negative except as  noted above.    Blood pressure 114/66, pulse 90, height 5' 10 (1.778 m), weight 204 lb 6.4 oz (92.7 kg), SpO2 93%.  General appearance: alert and no distress Neck: no adenopathy, no carotid bruit, no JVD, supple, symmetrical, trachea midline, and thyroid  not enlarged, symmetric, no tenderness/mass/nodules Lungs: clear to auscultation bilaterally Heart: regular rate and rhythm, S1, S2 normal, no murmur, click, rub or gallop Extremities: extremities normal, atraumatic, no cyanosis or edema Pulses: 2+ and symmetric Skin: Skin color, texture, turgor normal. No rashes or lesions Neurologic: Grossly normal  EKG EKG Interpretation Date/Time:  Monday July 05 2023 11:09:37 EST Ventricular Rate:  90 PR Interval:  188 QRS Duration:  92 QT Interval:  362 QTC Calculation: 442 R Axis:   27  Text Interpretation: Normal sinus rhythm Normal ECG When compared with ECG of 29-Jul-2016 12:15, No significant change was found Confirmed by Court Carrier [phone]) on 07/05/2023 11:38:37 AM    ASSESSMENT AND PLAN:   Essential hypertension History of essential hypertension with blood pressure measured today at 114/66.  He is on lisinopril.  Hyperlipidemia History of hyperlipidemia on fenofibrate .  He was on atorvastatin .  Because of side effects.  His most recent lipid profile performed 09/09/2022 revealed total cholesterol 159, LDL 100 and HDL 20.  He is scheduled for follow-up lipid profile with his PCP next month.     Carrier DOROTHA Court MD FACP,FACC,FAHA, [facility] 07/05/2023 12:11 PM

## 2023-07-05 NOTE — Assessment & Plan Note (Signed)
 History of hyperlipidemia on fenofibrate .  He was on atorvastatin .  Because of side effects.  His most recent lipid profile performed 09/09/2022 revealed total cholesterol 159, LDL 100 and HDL 20.  He is scheduled for follow-up lipid profile with his PCP next month.

## 2023-07-05 NOTE — Assessment & Plan Note (Signed)
 History of essential hypertension with blood pressure measured today at 114/66.  He is on lisinopril.

## 2023-07-05 NOTE — Patient Instructions (Signed)

## 2023-07-06 ENCOUNTER — Telehealth: Payer: Self-pay | Admitting: Pharmacist

## 2023-07-06 DIAGNOSIS — J849 Interstitial pulmonary disease, unspecified: Secondary | ICD-10-CM

## 2023-07-06 NOTE — Telephone Encounter (Signed)
 Enrolled patient into PF grant through PAF Award Period: 01/07/2023 - 07/05/2024 Cardholder: [id number] BIN: [id number] PCN: [id number] Group: [id number] For pharmacy inquiries, contact PDMI at [phone]. Amount  $4000  Please run pirfenidone  BIV (no longer eligible for [employer] PAP)  Sherry Pennant, PharmD, MPH, BCPS, CPP Clinical Pharmacist (Rheumatology and Pulmonology)

## 2023-07-10 NOTE — Telephone Encounter (Signed)
Submitted a Prior Authorization request to [facility] for PIRFENIDONE via CoverMyMeds. Will update once we receive a response.  Key: [id number]

## 2023-07-19 MED ORDER — PIRFENIDONE 801 MG PO TABS
801.0000 mg | ORAL_TABLET | Freq: Three times a day (TID) | ORAL | 0 refills | Status: DC
Start: 2023-07-19 — End: 2023-12-01
  Filled 2023-09-13: qty 270, 90d supply, fill #0

## 2023-07-19 NOTE — Telephone Encounter (Addendum)
Received notification from [facility] regarding a prior authorization for PIRFENIDONE. Authorization has been APPROVED from 07/10/2023 to 06/21/2024. Approval letter sent to scan center.  Patient can fill through [facility]: [phone]   Spoke with patient and advised him of grant enrollment. He has two full bottles right now and will run out at the end of March 2025. Rx sent to [facility] for onboarding in the latter half of March.  Chesley Mires, PharmD, MPH, BCPS, CPP Clinical Pharmacist (Rheumatology and Pulmonology)

## 2023-08-06 ENCOUNTER — Ambulatory Visit (HOSPITAL_BASED_OUTPATIENT_CLINIC_OR_DEPARTMENT_OTHER): Payer: Medicare HMO | Admitting: Pulmonary Disease

## 2023-08-06 DIAGNOSIS — J849 Interstitial pulmonary disease, unspecified: Secondary | ICD-10-CM | POA: Diagnosis not present

## 2023-08-06 LAB — PULMONARY FUNCTION TEST
DL/VA % pred: 84 %
DL/VA: 3.31 ml/min/mmHg/L
DLCO cor % pred: 64 %
DLCO cor: 15.95 ml/min/mmHg
DLCO unc % pred: 64 %
DLCO unc: 15.95 ml/min/mmHg
FEF 25-75 Post: 4.66 L/s
FEF 25-75 Pre: 4.98 L/s
FEF2575-%Change-Post: -6 %
FEF2575-%Pred-Post: 230 %
FEF2575-%Pred-Pre: 245 %
FEV1-%Change-Post: 0 %
FEV1-%Pred-Post: 103 %
FEV1-%Pred-Pre: 103 %
FEV1-Post: 3.01 L
FEV1-Pre: 3 L
FEV1FVC-%Change-Post: 1 %
FEV1FVC-%Pred-Pre: 122 %
FEV6-%Change-Post: -1 %
FEV6-%Pred-Post: 88 %
FEV6-%Pred-Pre: 89 %
FEV6-Post: 3.36 L
FEV6-Pre: 3.4 L
FEV6FVC-%Pred-Post: 107 %
FEV6FVC-%Pred-Pre: 107 %
FVC-%Change-Post: -1 %
FVC-%Pred-Post: 82 %
FVC-%Pred-Pre: 83 %
FVC-Post: 3.36 L
FVC-Pre: 3.4 L
Post FEV1/FVC ratio: 90 %
Post FEV6/FVC ratio: 100 %
Pre FEV1/FVC ratio: 88 %
Pre FEV6/FVC Ratio: 100 %
RV % pred: 58 %
RV: 1.55 L
TLC % pred: 71 %
TLC: 5.02 L

## 2023-08-06 NOTE — Patient Instructions (Signed)
Full PFT Performed Today

## 2023-08-06 NOTE — Progress Notes (Signed)
Full PFT Performed Today

## 2023-08-10 ENCOUNTER — Ambulatory Visit (HOSPITAL_BASED_OUTPATIENT_CLINIC_OR_DEPARTMENT_OTHER)
Admission: RE | Admit: 2023-08-10 | Discharge: 2023-08-10 | Disposition: A | Discharge status: Home / Self Care | Payer: Medicare HMO | Source: Ambulatory Visit | Attending: Pulmonary Disease | Admitting: Pulmonary Disease

## 2023-08-10 DIAGNOSIS — J849 Interstitial pulmonary disease, unspecified: Secondary | ICD-10-CM | POA: Diagnosis present

## 2023-08-11 ENCOUNTER — Other Ambulatory Visit: Payer: Self-pay | Admitting: Pharmacist

## 2023-08-11 NOTE — Progress Notes (Signed)
 Patient has been on pirfenidone for IPF for some time. No longer eligible for patient assistance program. Enrolled into pulmonary fibrosis grant.  Continue pirfenidone 801mg  three times daily  Chesley Mires, PharmD, MPH, BCPS, CPP Clinical Pharmacist (Rheumatology and Pulmonology)

## 2023-08-12 ENCOUNTER — Ambulatory Visit (INDEPENDENT_AMBULATORY_CARE_PROVIDER_SITE_OTHER): Payer: Medicare HMO | Admitting: Pulmonary Disease

## 2023-08-12 ENCOUNTER — Encounter: Payer: Self-pay | Admitting: Pulmonary Disease

## 2023-08-12 VITALS — BP 121/73 | HR 90 | Ht 70.0 in | Wt 201.8 lb

## 2023-08-12 DIAGNOSIS — J84112 Idiopathic pulmonary fibrosis: Secondary | ICD-10-CM | POA: Diagnosis not present

## 2023-08-12 DIAGNOSIS — Z5181 Encounter for therapeutic drug level monitoring: Secondary | ICD-10-CM

## 2023-08-12 DIAGNOSIS — I10 Essential (primary) hypertension: Secondary | ICD-10-CM

## 2023-08-12 DIAGNOSIS — N189 Chronic kidney disease, unspecified: Secondary | ICD-10-CM

## 2023-08-12 DIAGNOSIS — J069 Acute upper respiratory infection, unspecified: Secondary | ICD-10-CM

## 2023-08-12 DIAGNOSIS — J849 Interstitial pulmonary disease, unspecified: Secondary | ICD-10-CM

## 2023-08-12 DIAGNOSIS — N400 Enlarged prostate without lower urinary tract symptoms: Secondary | ICD-10-CM

## 2023-08-12 DIAGNOSIS — E119 Type 2 diabetes mellitus without complications: Secondary | ICD-10-CM

## 2023-08-12 LAB — COMPREHENSIVE METABOLIC PANEL
ALT: 21 U/L (ref 0–53)
AST: 18 U/L (ref 0–37)
Albumin: 4.3 g/dL (ref 3.5–5.2)
Alkaline Phosphatase: 101 U/L (ref 39–117)
BUN: 21 mg/dL (ref 6–23)
CO2: 27 meq/L (ref 19–32)
Calcium: 9.1 mg/dL (ref 8.4–10.5)
Chloride: 104 meq/L (ref 96–112)
Creatinine, Ser: 1.24 mg/dL (ref 0.40–1.50)
GFR: 55.37 mL/min — ABNORMAL LOW (ref >60.00)
Glucose, Bld: 139 mg/dL — ABNORMAL HIGH (ref 70–99)
Potassium: 4.3 meq/L (ref 3.5–5.1)
Sodium: 140 meq/L (ref 135–145)
Total Bilirubin: 0.4 mg/dL (ref 0.2–1.2)
Total Protein: 7.1 g/dL (ref 6.0–8.3)

## 2023-08-12 NOTE — Progress Notes (Signed)
 Randall Horton    [id number]    May 18, 1946  Primary Care Physician:South, Jeannett Senior, MD  Referring Physician: Adrian Prince, MD [address] [place],  [place] [address]  Chief complaint:  Follow-up for IPF Ofev started July 2023, dose reduced September 2023 due to side effects Switch to Esbriet in May 2024 since Ofev was poorly tolerated. HPI: 78 y.o.  with history of hypertension, type 1 diabetes, hyperlipidemia, stroke, allergies.    Referred for evaluation of pulmonary fibrosis with cyst discovered incidentally on CT abdomen. Follow up CT of the chest shows definite UIP pattern.  Discussed at [facility] ILD conference 02/06/22 CT is LATEST SCAN: latest is subpleural predomm early HC, , bilateral bibasal reticlation , No AT,, CCG +. Latest scan is definite UIP. OLD scan was called ALTERNATE because of som e nodularity that in retrospect was transient. Agree with some progression. DX - Clinical IPF - white male progression UIP and woodwork  Started Ofev on on 01/16/2022. Initially it was well tolerated but has progressive stomach discomfort and diarrhea that did not get better with immodium and mylanta.  Dose reduction was attempted but he still continued to have symptoms.  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  He continues to do woodwork as a hobby and is exposed to moldy wood dust ILD questionnaire 10/22/2021-as above Exposures: As above Smoking history: 20-pack-year smoker.  Quit in 1979 Travel history: No significant travel history Relevant family history: No family history of lung disease  Interim history: Discussed the use of AI scribe software for clinical note transcription with the patient, who gave verbal consent to proceed.  The patient, with pulmonary fibrosis, presents for follow-up. He is accompanied by [name], his spouse.  He has a history of pulmonary fibrosis,  managed with medication. Recent CT scan showed slight progression of scarring, while lung function tests indicated slight improvement. He tolerates the medication well and has adjusted the timing of doses to avoid dizziness and vertigo.  Approximately six weeks ago, he experienced an upper respiratory ailment with yellow mucus and occasional epistaxis. Antibiotics for seven days alleviated the symptoms. The condition progressed to his chest, causing phlegm, coughing, and congestion, though he did not feel severely ill.  He adjusted the timing of his Esprit medication due to dizziness and vertigo occurring 30 to 45 minutes after morning doses. He now takes it at lunch, dinner, and bedtime, resolving the issue. He occasionally misses doses but tries to take them as soon as he remembers, unless it's too close to the next dose.  He has a history of ketoacidosis, previously resulting in a coma, but his kidney function is stable. He uses a Dextrocom device to monitor his A1c level, which is 5.9, and manages diabetes with an insulin pump.  He switched from Flomax to silodosin for prostate issues and is on another medication to shrink the prostate. He believes urinary discomfort can add stress to the body.  He mentions decreased physical activity due to back and hip problems, particularly during winter. He uses a powered bicycle for exercise, doing 500 to 600 repetitions forward and backward, which he finds helpful.  He resides in [place] and has a history of being active, though his activity level has decreased during the winter months.   Outpatient Encounter Medications as of 08/12/2023  Medication Sig   acetaminophen (TYLENOL) 500 MG tablet Take 1,000 mg by mouth  every 6 (six) hours as needed for moderate pain or headache.   ADMELOG 100 UNIT/ML injection Inject 12 mLs into the skin daily.   allopurinol (ZYLOPRIM) 300 MG tablet Take 300 mg by mouth every other day.   Ascorbic Acid (VITAMIN C) 500 MG  CHEW Chew 1 tablet by mouth daily.   clopidogrel (PLAVIX) 75 MG tablet Take 75 mg by mouth daily.   clotrimazole (MYCELEX) 10 MG troche DISSOLVE 1 TABLET UP TO 5 TIMES A DAY   Continuous Glucose Receiver (DEXCOM G7 RECEIVER) DEVI by Does not apply route.   diphenhydrAMINE (BENADRYL) 25 MG tablet Take 25 mg by mouth at bedtime as needed for itching (around pump site.).   docusate sodium (COLACE) 100 MG capsule Take 100 mg by mouth 2 (two) times daily as needed for mild constipation.   fenofibrate 160 MG tablet Take 160 mg by mouth every evening.   gabapentin (NEURONTIN) 300 MG capsule 300 mg every evening.    hydrocortisone 1 % ointment Apply topically daily. APPLY TO EARS    Insulin Human (INSULIN PUMP) SOLN Inject 110-120 each into the skin daily. Novolog.   LEXAPRO 10 MG tablet Take 10 mg by mouth daily.   lisinopril (ZESTRIL) 10 MG tablet Take 10 mg by mouth daily.   loratadine (CLARITIN) 10 MG tablet Take 10 mg by mouth 2 (two) times daily.   Multiple Vitamin (MULTIVITAMIN) tablet Take 1 tablet by mouth daily.   omeprazole (PRILOSEC OTC) 20 MG tablet Take 20 mg by mouth every morning.   Pirfenidone (ESBRIET) 801 MG TABS Take 1 tablet (801 mg total) by mouth 3 (three) times daily with meals.   silodosin (RAPAFLO) 8 MG CAPS capsule Take 8 mg by mouth at bedtime.   traMADol (ULTRAM) 50 MG tablet Take by mouth every 6 (six) hours as needed.   Vitamin D, Cholecalciferol, 50 MCG (2000 UT) CAPS Take 1 capsule by mouth daily.   ACCU-CHEK GUIDE test strip USE TO SELF MONITOR BLOOD GLUCOSE UP TO 10 TIMES DAILY (E11.65)   atorvastatin (LIPITOR) 40 MG tablet Take 1 tablet (40 mg total) by mouth daily at 6 PM.   Continuous Blood Gluc Sensor (FREESTYLE LIBRE 14 DAY SENSOR) MISC  (Patient not taking: Reported on 07/05/2023)   Continuous Glucose Sensor (DEXCOM G7 SENSOR) MISC by Does not apply route.   NOVOLOG 100 UNIT/ML injection PER PUMP PATIENT USES 120 UNITS PER DAY DX  250.50   tamsulosin (FLOMAX)  0.4 MG CAPS capsule Take 0.4 mg by mouth daily after supper. (Patient not taking: Reported on 07/05/2023)   No facility-administered encounter medications on file as of 08/12/2023.   Physical Exam: Blood pressure 121/73, pulse 90, height 5\' 10"  (1.778 m), weight 201 lb 12.8 oz (91.5 kg), SpO2 96%. Gen:      No acute distress HEENT:  EOMI, sclera anicteric Neck:     No masses; no thyromegaly Lungs:    Clear to auscultation bilaterally; normal respiratory effort CV:         Regular rate and rhythm; no murmurs Abd:      + bowel sounds; soft, non-tender; no palpable masses, no distension Ext:    No edema; adequate peripheral perfusion Skin:      Warm and dry; no rash Neuro: alert and oriented x 3 Psych: normal mood and affect   Data Reviewed: Imaging: CT chest 05/21/2021-mild pulmonary fibrosis in apical to basal gradient with peripheral bronchiolectasis, centrilobular nodularity in the upper lobes.  Differential diagnosis is chronic HP,  alternate diagnosis. New spiculated nodule in the left pulmonary apex.    CT high-resolution 11/14/2021-slight interval progression of fibrotic lung disease now in UIP pattern.   CT high-resolution 08/10/2023-f Minimally pulmonary progressive pattern of pulmonary fibrosis in UIP pattern  I have reviewed the images personally  PFTs: 12/11/2021 FVC 3.75 [90%], FEV1 3.32 [111%], F/F 89, TLC 4.90 [69%], DLCO 14.86 [59%] Mild restriction, moderate diffusion defect    Labs: CTD serologies 10/22/2021-positive for ANA 1:40 CMP 02/16/2022- WNL  Assessment:  Evaluation for interstitial lung disease His CT shows progression of fibrotic changes with no new changes of honeycombing and UIP pattern.  Differential diagnoses include IPF, chronic hypersensitivity pneumonitis as he is exposed to paint in the past and has ongoing exposure to moldy wood through his woodworking hobby though he maintains full protection with masks and ventilation while working with wood and  he has been limiting his woodworking recently  Given progression of disease I have recommended that we treat with antifibrotics.  Discussion at multidisciplinary interdisciplinary conference confirms diagnosis of IPF.  He is unable to tolerate Ofev due to GI symptoms.  Switch to Esbriet which is tolerated better  Reviewed labs from primary care, LFTs performed on 01/26/2023 with normal test.  Continue current therapy  Follow-up high-res CT and PFTs in 6 months.  Will order echocardiogram for evaluation of pulmonary hypertension  Postnasal drip Nasonex, Zyrtec over-the-counter.  He is already on Claritin.  Lung nodule Stable on follow-up scan.  Plan/Recommendations: Continue pirfenidone Monitor labs Echocardiogram High-res CT and PFTs in 6 months.  Chilton Greathouse MD  Pulmonary and Critical Care 08/12/2023, 11:17 AM  CC: Adrian Prince, MD

## 2023-08-12 NOTE — Patient Instructions (Signed)
 VISIT SUMMARY:  Today, we reviewed your ongoing health conditions, including pulmonary fibrosis, recent upper respiratory infection, hypertension, chronic kidney disease, diabetes, and benign prostatic hyperplasia. We discussed your current medications, recent test results, and made plans for continued management and follow-up.  YOUR PLAN:  -IDIOPATHIC PULMONARY FIBROSIS (IPF): IPF is a progressive disease where lung tissue becomes scarred over time. Your recent CT scan shows slight progression of scarring, but your lung function tests have improved slightly. Continue your current medication regimen, and we will order liver function tests. Please follow up in six months.  -UPPER RESPIRATORY INFECTION: You recently had an upper respiratory infection that was treated with antibiotics. The infection has resolved, but you experienced chest congestion and phlegm. Please monitor for any recurrence of symptoms.  -HYPERTENSION: Hypertension, or high blood pressure, is well-controlled with your current medication. Your recent blood pressure readings are on the lower side. Continue taking your current medication and monitor your blood pressure regularly.  -CHRONIC KIDNEY DISEASE: Chronic kidney disease means your kidneys are not functioning as well as they should. Your kidney function is stable according to your recent nephrologist follow-up. Continue to avoid brown drinks and monitor your kidney function.  -DIABETES MELLITUS: Diabetes is a condition where your blood sugar levels are too high. Your diabetes is well-controlled with your insulin pump and continuous glucose monitoring. Your recent A1c level is 5.9. Continue your current diabetes management and monitor your blood glucose levels regularly.  -BENIGN PROSTATIC HYPERPLASIA (BPH): BPH is an enlarged prostate gland that can cause urinary symptoms. Your symptoms have improved after switching medications. Continue taking your current medications for  BPH.  -GENERAL HEALTH MAINTENANCE: Continue with your physical therapy and exercise to maintain lung function, especially during the winter months. Regular physical activity is encouraged.  INSTRUCTIONS:  Please follow up in six months and get your liver function tests done today.

## 2023-08-24 ENCOUNTER — Telehealth: Payer: Self-pay | Admitting: Pharmacist

## 2023-08-24 NOTE — Telephone Encounter (Signed)
 Received call from patient regarding pirfenidone. Advised that Mills Koller will reach out before he runs out of medication to schedule shipment to home. He confirms he has enough medication to get through March 2025  Chesley Mires, PharmD, MPH, BCPS, CPP Clinical Pharmacist (Rheumatology and Pulmonology)

## 2023-09-13 ENCOUNTER — Other Ambulatory Visit: Payer: Self-pay

## 2023-09-13 ENCOUNTER — Other Ambulatory Visit (HOSPITAL_COMMUNITY): Payer: Self-pay

## 2023-09-13 NOTE — Progress Notes (Signed)
 Specialty Pharmacy Initial Fill Coordination Note  Randall Horton is a 78 y.o. male contacted today regarding initial fill of specialty medication(s) Pirfenidone   Patient requested Delivery   Delivery date: 09/15/23   Verified address: [address]   [facility] [place] [address]   Medication will be filled on 09/13/24.   Patient has grant on file and is aware of $0 copayment.

## 2023-09-14 ENCOUNTER — Other Ambulatory Visit: Payer: Self-pay

## 2023-10-22 ENCOUNTER — Other Ambulatory Visit (HOSPITAL_COMMUNITY): Payer: Self-pay | Admitting: Endocrinology

## 2023-10-22 ENCOUNTER — Ambulatory Visit (HOSPITAL_BASED_OUTPATIENT_CLINIC_OR_DEPARTMENT_OTHER)
Admission: RE | Admit: 2023-10-22 | Discharge: 2023-10-22 | Disposition: A | Discharge status: Home / Self Care | Source: Ambulatory Visit | Attending: Endocrinology | Admitting: Endocrinology

## 2023-10-22 DIAGNOSIS — R1030 Lower abdominal pain, unspecified: Secondary | ICD-10-CM

## 2023-10-22 LAB — POCT I-STAT CREATININE: Creatinine, Ser: 1.4 mg/dL — ABNORMAL HIGH (ref 0.61–1.24)

## 2023-10-22 MED ORDER — IOHEXOL 300 MG/ML  SOLN
100.0000 mL | Freq: Once | INTRAMUSCULAR | Status: AC | PRN
  Administered 2023-10-22: 100 mL via INTRAVENOUS

## 2023-12-01 ENCOUNTER — Other Ambulatory Visit (HOSPITAL_COMMUNITY): Payer: Self-pay

## 2023-12-01 ENCOUNTER — Other Ambulatory Visit: Payer: Self-pay

## 2023-12-01 ENCOUNTER — Other Ambulatory Visit: Payer: Self-pay | Admitting: Pulmonary Disease

## 2023-12-01 DIAGNOSIS — J849 Interstitial pulmonary disease, unspecified: Secondary | ICD-10-CM

## 2023-12-01 DIAGNOSIS — Z5181 Encounter for therapeutic drug level monitoring: Secondary | ICD-10-CM

## 2023-12-01 MED ORDER — PIRFENIDONE 801 MG PO TABS
801.0000 mg | ORAL_TABLET | Freq: Three times a day (TID) | ORAL | 0 refills | Status: AC
Start: 2023-12-01 — End: ?
  Filled 2023-12-01: qty 90, 30d supply, fill #0

## 2023-12-01 NOTE — Telephone Encounter (Signed)
 Refill sent for PIRFENIDONE  to [facility]: [phone]   Dose: 801mg  three times daily  Last OV: 08/12/2023 Provider: Dr. Waylan Haggard Pertinent labs: LFTs wnl on 08/12/2023 and overdue to be updated. Mychart message sent  Next OV: due in August 2025  Geraldene Kleine, PharmD, MPH, BCPS Clinical Pharmacist (Rheumatology and Pulmonology)

## 2023-12-01 NOTE — Progress Notes (Signed)
 Specialty Pharmacy Refill Coordination Note  Randall Horton is a 78 y.o. male contacted today regarding refills of specialty medication(s) Pirfenidone    Patient requested Delivery   Delivery date: 12/09/23   Verified address: [address]   [facility] [place] [address]   Medication will be filled on 12/08/23, pending refill approval.

## 2023-12-02 ENCOUNTER — Other Ambulatory Visit

## 2023-12-02 DIAGNOSIS — Z5181 Encounter for therapeutic drug level monitoring: Secondary | ICD-10-CM

## 2023-12-02 DIAGNOSIS — J849 Interstitial pulmonary disease, unspecified: Secondary | ICD-10-CM

## 2023-12-02 NOTE — Addendum Note (Signed)
 Addended by: Lorre Opdahl C on: 12/02/2023 01:09 PM   Modules accepted: Orders

## 2023-12-03 ENCOUNTER — Ambulatory Visit: Payer: Self-pay | Admitting: Pharmacist

## 2023-12-03 ENCOUNTER — Other Ambulatory Visit (HOSPITAL_COMMUNITY): Payer: Self-pay

## 2023-12-03 ENCOUNTER — Other Ambulatory Visit: Payer: Self-pay

## 2023-12-03 LAB — HEPATIC FUNCTION PANEL
AG Ratio: 2.1 (calc) (ref 1.0–2.5)
ALT: 13 U/L (ref 9–46)
AST: 16 U/L (ref 10–35)
Albumin: 4.1 g/dL (ref 3.6–5.1)
Alkaline phosphatase (APISO): 104 U/L (ref 35–144)
Bilirubin, Direct: 0.1 mg/dL (ref 0.0–0.2)
Globulin: 2 g/dL (ref 1.9–3.7)
Indirect Bilirubin: 0.2 mg/dL (ref 0.2–1.2)
Total Bilirubin: 0.3 mg/dL (ref 0.2–1.2)
Total Protein: 6.1 g/dL (ref 6.1–8.1)

## 2023-12-03 MED ORDER — PIRFENIDONE 801 MG PO TABS
801.0000 mg | ORAL_TABLET | Freq: Three times a day (TID) | ORAL | 1 refills | Status: DC
  Filled 2023-12-03: qty 270, 90d supply, fill #0
  Filled 2024-02-29: qty 270, 90d supply, fill #1

## 2023-12-03 NOTE — Addendum Note (Signed)
 Addended by: Thais Fill on: 12/03/2023 08:36 AM   Modules accepted: Orders

## 2023-12-03 NOTE — Telephone Encounter (Signed)
 Refill sent for PIRFENIDONE  to [facility]: [phone]   Dose: 801mg  three times daily  Last OV: 08/12/2023 Provider: Dr. Waylan Haggard Pertinent labs: LFTs on 12/02/2023 wnl  Next OV: due in August 2025  Geraldene Kleine, PharmD, MPH, BCPS, CPP Clinical Pharmacist (Rheumatology and Pulmonology)

## 2023-12-08 ENCOUNTER — Other Ambulatory Visit: Payer: Self-pay

## 2023-12-08 ENCOUNTER — Other Ambulatory Visit (HOSPITAL_COMMUNITY): Payer: Self-pay

## 2024-02-29 ENCOUNTER — Other Ambulatory Visit: Payer: Self-pay

## 2024-02-29 ENCOUNTER — Encounter (INDEPENDENT_AMBULATORY_CARE_PROVIDER_SITE_OTHER): Payer: Self-pay

## 2024-02-29 ENCOUNTER — Other Ambulatory Visit (HOSPITAL_COMMUNITY): Payer: Self-pay

## 2024-02-29 NOTE — Progress Notes (Signed)
 Specialty Pharmacy Refill Coordination Note  MyChart Questionnaire Submission  Randall Horton is a 78 y.o. male contacted today regarding refills of specialty medication(s) Pirfenidone .  Doses on hand: (Patient-Rptd) 30   Patient requested: (Patient-Rptd) Delivery   Delivery date: 03/02/24  Verified address: [address] [name] [address]  Medication will be filled on 03/01/24.

## 2024-03-02 ENCOUNTER — Other Ambulatory Visit (HOSPITAL_COMMUNITY): Payer: Self-pay

## 2024-05-08 ENCOUNTER — Other Ambulatory Visit: Payer: Self-pay

## 2024-05-08 NOTE — Progress Notes (Signed)
 Specialty Pharmacy Ongoing Clinical Assessment Note  Randall Horton is a 78 y.o. male who is being followed by the specialty pharmacy service for RxSp Interstitial Lung Disease   Patient's specialty medication(s) reviewed today: Pirfenidone    Missed doses in the last 4 weeks: 0   Patient/Caregiver did not have any additional questions or concerns.   Therapeutic benefit summary: Patient is achieving benefit   Adverse events/side effects summary: No adverse events/side effects   Patient's therapy is appropriate to: Continue    Goals Addressed             This Visit's Progress    Maintain optimal adherence to therapy   On track    Patient is on track. Patient will maintain adherence and adhere to provider and/or lab appointments         Follow up: 12 months  Arriana Lohmann M Georgio Hattabaugh Specialty Pharmacist

## 2024-05-25 ENCOUNTER — Telehealth: Payer: Self-pay

## 2024-05-25 NOTE — Telephone Encounter (Signed)
 Patient scheduled for video visit.

## 2024-05-25 NOTE — Telephone Encounter (Signed)
 Patient called wondering if he needs to reschedule appointment tomorrow due to weather coming in. He lives a distance away from our clinic. Advised I can give him front desk number, but he preferred I have front desk call him.

## 2024-05-26 ENCOUNTER — Encounter: Payer: Self-pay | Admitting: Pulmonary Disease

## 2024-05-26 ENCOUNTER — Ambulatory Visit: Admitting: Pulmonary Disease

## 2024-05-26 ENCOUNTER — Telehealth: Admitting: Pulmonary Disease

## 2024-05-26 VITALS — Ht 70.0 in | Wt 199.0 lb

## 2024-05-26 DIAGNOSIS — J84112 Idiopathic pulmonary fibrosis: Secondary | ICD-10-CM

## 2024-05-26 DIAGNOSIS — Z5181 Encounter for therapeutic drug level monitoring: Secondary | ICD-10-CM

## 2024-05-26 DIAGNOSIS — J849 Interstitial pulmonary disease, unspecified: Secondary | ICD-10-CM

## 2024-05-26 DIAGNOSIS — G629 Polyneuropathy, unspecified: Secondary | ICD-10-CM

## 2024-05-26 NOTE — Progress Notes (Signed)
 RANDEEP BIONDOLILLO    [id number]    08-31-1945  Primary Care Physician:South, Garnette, MD  Referring Physician: Nichole Garnette, MD [address] [place],  [place] [address]  Virtual Visit via Video Note  I connected with Randall Horton on 05/26/24 at  9:00 AM EST by a video enabled telemedicine application and verified that I am speaking with the correct person using two identifiers.  Location: Patient: Home Provider: Pulmonary office, [address]   I discussed the limitations of evaluation and management by telemedicine and the availability of in person appointments. The patient expressed understanding and agreed to proceed.  Chief complaint:  Follow-up for IPF Ofev  started July 2023, dose reduced September 2023 due to side effects Switch to Esbriet  in May 2024 since Ofev  was poorly tolerated.  HPI: 78 y.o.  with history of hypertension, type 1 diabetes, hyperlipidemia, stroke, allergies.    Referred for evaluation of pulmonary fibrosis with cyst discovered incidentally on CT abdomen. Follow up CT of the chest shows definite UIP pattern.  Discussed at [facility] ILD conference 02/06/22 CT is LATEST SCAN: latest is subpleural predomm early HC, , bilateral bibasal reticlation , No AT,, CCG +. Latest scan is definite UIP. OLD scan was called ALTERNATE because of som e nodularity that in retrospect was transient. Agree with some progression. DX - Clinical IPF - white male progression UIP and woodwork  Started Ofev  on on 01/16/2022. Initially it was well tolerated but has progressive stomach discomfort and diarrhea that did not get better with immodium and mylanta.  Dose reduction was attempted but he still continued to have symptoms.  Switched therapy to Esbriet  in May 2024  Interim history: Discussed the use of AI scribe software for clinical note transcription with the patient, who gave verbal consent to proceed.  History of Present Illness   Randall Horton is an 78 year old male with  idiopathic pulmonary fibrosis who presents with increased fatigue and shortness of breath. He is accompanied by his wife, Consuelo.  Dyspnea and fatigue - Increased fatigue and shortness of breath over recent weeks - Reduced exercise tolerance, requiring rest after a couple of hours of activity - Uses small oxygen canisters for symptomatic relief when fatigued - Does not use continuous home oxygen  Pirfenidone  therapy - Takes pirfenidone  three times daily for idiopathic pulmonary fibrosis - Experiences dizziness after the morning dose, which he associates with taking the medication without sufficient food - Attempts to take pirfenidone  with meals - Missed one dose a couple of weeks ago but otherwise adheres to prescribed regimen  Photosensitivity precautions - Avoids direct sunlight due to pirfenidone  therapy - Uses protective clothing and hats to minimize sun exposure       Interim history: Pets: No pets 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  He continues to do woodwork as a hobby and is exposed to moldy wood dust ILD questionnaire 10/22/2021-as above Exposures: As above Smoking history: 20-pack-year smoker.  Quit in 1979 Travel history: No significant travel history Relevant family history: No family history of lung disease  Outpatient Encounter Medications as of 05/26/2024  Medication Sig   ACCU-CHEK GUIDE test strip USE TO SELF MONITOR BLOOD GLUCOSE UP TO 10 TIMES DAILY (E11.65)   acetaminophen  (TYLENOL ) 500 MG tablet Take 1,000 mg by mouth every 6 (six) hours as needed for moderate pain or headache.   ADMELOG 100 UNIT/ML injection Inject 12 mLs into the skin daily.  allopurinol  (ZYLOPRIM ) 300 MG tablet Take 300 mg by mouth every other day.   Ascorbic Acid (VITAMIN C) 500 MG CHEW Chew 1 tablet by mouth daily.   atorvastatin  (LIPITOR) 40 MG tablet Take 1 tablet (40 mg total) by mouth daily at 6 PM.   clopidogrel  (PLAVIX ) 75 MG  tablet Take 75 mg by mouth daily.   Continuous Glucose Receiver (DEXCOM G7 RECEIVER) DEVI by Does not apply route.   diphenhydrAMINE  (BENADRYL ) 25 MG tablet Take 25 mg by mouth at bedtime as needed for itching (around pump site.).   docusate sodium  (COLACE) 100 MG capsule Take 100 mg by mouth 2 (two) times daily as needed for mild constipation.   finasteride (PROSCAR) 5 MG tablet Take 5 mg by mouth daily.   gabapentin  (NEURONTIN ) 300 MG capsule 300 mg every evening.    hydrocortisone  1 % ointment Apply topically daily. APPLY TO EARS    Insulin  Human (INSULIN  PUMP) SOLN Inject 110-120 each into the skin daily. Novolog .   LEXAPRO 10 MG tablet Take 10 mg by mouth daily.   lisinopril (ZESTRIL) 10 MG tablet Take 10 mg by mouth daily.   Multiple Vitamin (MULTIVITAMIN) tablet Take 1 tablet by mouth daily.   omeprazole  (PRILOSEC  OTC) 20 MG tablet Take 20 mg by mouth every morning.   Pirfenidone  (ESBRIET ) 801 MG TABS Take 1 tablet (801 mg total) by mouth with breakfast, with lunch, and with evening meal.   silodosin (RAPAFLO) 8 MG CAPS capsule Take 8 mg by mouth at bedtime.   traMADol (ULTRAM) 50 MG tablet Take by mouth every 6 (six) hours as needed.   Vitamin D, Cholecalciferol, 50 MCG (2000 UT) CAPS Take 1 capsule by mouth daily.   clotrimazole  (MYCELEX ) 10 MG troche DISSOLVE 1 TABLET UP TO 5 TIMES A DAY (Patient not taking: Reported on 05/26/2024)   Continuous Blood Gluc Sensor (FREESTYLE LIBRE 14 DAY SENSOR) MISC  (Patient not taking: Reported on 07/05/2023)   Continuous Glucose Sensor (DEXCOM G7 SENSOR) MISC by Does not apply route.   fenofibrate  160 MG tablet Take 160 mg by mouth every evening. (Patient not taking: Reported on 05/26/2024)   loratadine  (CLARITIN ) 10 MG tablet Take 10 mg by mouth 2 (two) times daily. (Patient not taking: Reported on 05/26/2024)   NOVOLOG  100 UNIT/ML injection PER PUMP PATIENT USES 120 UNITS PER DAY DX  250.50   tamsulosin (FLOMAX) 0.4 MG CAPS capsule Take 0.4 mg by  mouth daily after supper. (Patient not taking: Reported on 07/05/2023)   No facility-administered encounter medications on file as of 05/26/2024.   Vitals:   05/26/24 0850  Height: 5' 10 (1.778 m)  Weight: 199 lb (90.3 kg)  BMI (Calculated): 28.55     Physical Exam   Tele       Data Reviewed: Imaging: CT chest 05/21/2021-mild pulmonary fibrosis in apical to basal gradient with peripheral bronchiolectasis, centrilobular nodularity in the upper lobes.  Differential diagnosis is chronic HP, alternate diagnosis. New spiculated nodule in the left pulmonary apex.    CT high-resolution 11/14/2021-slight interval progression of fibrotic lung disease now in UIP pattern.   CT high-resolution 08/10/2023- Minimally pulmonary progressive pattern of pulmonary fibrosis in UIP pattern  CT abdomen pelvis 10/22/2023-colonic diverticulosis.  Stable pattern of pulmonary fibrosis.  I have reviewed the images personally  PFTs: 12/11/2021 FVC 3.75 [90%], FEV1 3.32 [111%], F/F 89, TLC 4.90 [69%], DLCO 14.86 [59%] Mild restriction, moderate diffusion defect  08/06/2023 FVC 2.36 [82%, FEV1 2.01 [103%], F/F90, TLC 5.02 [)],  DLCO 15.95 (64%) Mild restriction, mild diffusion defect  Labs: CTD serologies 10/22/2021-positive for ANA 1:40  Cardiac: Echocardiogram 03/05/2023-LVEF 65-70%, grade 1 diastolic dysfunction, normal PA systolic pressure  Assessment and Plan    Idiopathic pulmonary fibrosis Well-managed with no new changes on recent CT scan compared to February. Continues on pirfenidone  (Esbriet ) with dizziness as a side effect, managed by taking medication with food. No new pulmonary symptoms reported. Liver function monitoring is necessary due to medication. - Continue pirfenidone  (Esbriet ) three times daily with food. - Ensure liver function tests are performed during next physical in March. - Will order follow-up CT scan in six months to monitor disease progression.        Assessment:   Idiopathic Pulmonary Fibrosis (IPF) His CT shows progression of fibrotic changes with no new changes of honeycombing and UIP pattern.  Differential diagnoses include IPF, chronic hypersensitivity pneumonitis as he is exposed to paint in the past and has ongoing exposure to moldy wood through his woodworking hobby though he maintains full protection with masks and ventilation while working with wood and he has been limiting his woodworking recently  Given progression of disease I have recommended that we treat with antifibrotics.  Discussion at multidisciplinary interdisciplinary conference confirms diagnosis of IPF.  He is unable to tolerate Ofev  due to GI symptoms.  Switch to Esbriet  which is tolerated better  - Continue current medication regimen - Follow-up CT ordered in 6 months - Labs earlier this year are okay.  He we will get follow-up hepatic panel at primary care in March 2026  Neuropathy, unsteadiness Has unsteadiness of gait, neuropathy, sensation changes in his extremities which has been progressive Requesting referral to neurology for evaluation.  General Health Maintenance Advised to continue physical therapy and exercise to maintain lung function, especially during winter months. - Encourage regular physical activity - Continue physical therapy exercises   Plan/Recommendations: Continue pirfenidone  Monitor labs Follow-up CT chest Neurology referral  I discussed the assessment and treatment plan with the patient. The patient was provided an opportunity to ask questions and all were answered. The patient agreed with the plan and demonstrated an understanding of the instructions.   The patient was advised to call back or seek an in-person evaluation if the symptoms worsen or if the condition fails to improve as anticipated.  Lonna Coder MD [facility] 05/26/2024, 9:14 AM  CC: Nichole Senior, MD

## 2024-05-26 NOTE — Patient Instructions (Signed)
  VISIT SUMMARY: Today, we discussed your increased fatigue and shortness of breath related to idiopathic pulmonary fibrosis. We reviewed your current medication regimen and precautions for managing side effects.  YOUR PLAN: IDIOPATHIC PULMONARY FIBROSIS: Your condition is stable with no new changes on your recent CT scan. You are experiencing dizziness as a side effect of your medication, which is managed by taking it with food. -Continue taking pirfenidone  (Esbriet ) three times daily with food. -Ensure liver function tests are performed during your next physical in March. -A follow-up CT scan will be ordered in six months to monitor disease progression.  DYSPNEA AND FATIGUE: You have been experiencing increased fatigue and shortness of breath over recent weeks, requiring rest after a couple of hours of activity. -Continue using small oxygen canisters for symptomatic relief when fatigued. -Monitor your symptoms and report any significant changes.

## 2024-05-29 ENCOUNTER — Other Ambulatory Visit (HOSPITAL_COMMUNITY): Payer: Self-pay

## 2024-05-29 ENCOUNTER — Other Ambulatory Visit: Payer: Self-pay | Admitting: Pulmonary Disease

## 2024-05-29 ENCOUNTER — Other Ambulatory Visit: Payer: Self-pay

## 2024-05-29 DIAGNOSIS — J849 Interstitial pulmonary disease, unspecified: Secondary | ICD-10-CM

## 2024-05-29 MED ORDER — PIRFENIDONE 801 MG PO TABS
801.0000 mg | ORAL_TABLET | Freq: Three times a day (TID) | ORAL | 2 refills | Status: DC
  Filled 2024-05-29 (×2): qty 90, 30d supply, fill #0
  Filled 2024-07-13 – 2024-07-14 (×2): qty 90, 30d supply, fill #1

## 2024-05-29 NOTE — Progress Notes (Signed)
 Specialty Pharmacy Refill Coordination Note  MyChart Questionnaire Submission  Randall Horton is a 78 y.o. male contacted today regarding refills of specialty medication(s) Pirfenidone .  Doses on hand: 30 (90 tabs)   Patient requested: (Patient-Rptd) Delivery   Delivery date: 06/21/24  Verified address: [address] [name] [address]  Medication will be filled on 06/20/24

## 2024-05-29 NOTE — Telephone Encounter (Signed)
 Refill sent for ESBRIET  to [facility]: [phone]   Dose: 801mg  by mouth three times daily   Last OV: 05/26/24 Provider: Dr. Theophilus Pertinent labs: 12/02/23 LFTs wnl - per Dr. Theophilus, next LFTs during physical in March 2026  Next OV: due June 2026  Randall Horton, PharmD, BCPS Clinical Pharmacist  [facility]

## 2024-05-29 NOTE — Telephone Encounter (Signed)
 Pt requesting refill of specialty medication - routing to Rx team to advise.

## 2024-06-14 ENCOUNTER — Encounter: Payer: Self-pay | Admitting: Neurology

## 2024-06-28 ENCOUNTER — Encounter: Payer: Self-pay | Admitting: Cardiovascular Disease

## 2024-07-12 ENCOUNTER — Encounter: Payer: Self-pay | Admitting: Neurology

## 2024-07-12 ENCOUNTER — Ambulatory Visit (INDEPENDENT_AMBULATORY_CARE_PROVIDER_SITE_OTHER): Payer: Self-pay | Admitting: Neurology

## 2024-07-12 VITALS — BP 109/58 | HR 98 | Ht 70.0 in | Wt 200.0 lb

## 2024-07-12 DIAGNOSIS — G629 Polyneuropathy, unspecified: Secondary | ICD-10-CM | POA: Diagnosis not present

## 2024-07-12 NOTE — Patient Instructions (Addendum)
Nerve testing of the right arm and leg  ELECTROMYOGRAM AND NERVE CONDUCTION STUDIES (EMG/NCS) INSTRUCTIONS  How to Prepare The neurologist conducting the EMG will need to know if you have certain medical conditions. Tell the neurologist and other EMG lab personnel if you: Have a pacemaker or any other electrical medical device Take blood-thinning medications Have hemophilia, a blood-clotting disorder that causes prolonged bleeding Bathing Take a shower or bath shortly before your exam in order to remove oils from your skin. Don't apply lotions or creams before the exam.  What to Expect You'll likely be asked to change into a hospital gown for the procedure and lie down on an examination table. The following explanations can help you understand what will happen during the exam.  Electrodes. The neurologist or a technician places surface electrodes at various locations on your skin depending on where you're experiencing symptoms. Or the neurologist may insert needle electrodes at different sites depending on your symptoms.  Sensations. The electrodes will at times transmit a tiny electrical current that you may feel as a twinge or spasm. The needle electrode may cause discomfort or pain that usually ends shortly after the needle is removed. If you are concerned about discomfort or pain, you may want to talk to the neurologist about taking a short break during the exam.  Instructions. During the needle EMG, the neurologist will assess whether there is any spontaneous electrical activity when the muscle is at rest - activity that isn't present in healthy muscle tissue - and the degree of activity when you slightly contract the muscle.  He or she will give you instructions on resting and contracting a muscle at appropriate times. Depending on what muscles and nerves the neurologist is examining, he or she may ask you to change positions during the exam.  After your EMG You may experience some  temporary, minor bruising where the needle electrode was inserted into your muscle. This bruising should fade within several days. If it persists, contact your primary care doctor.

## 2024-07-12 NOTE — Progress Notes (Signed)
 [facility] Clinic Note - Initial Visit   Date: 07/12/2024   Randall Horton MRN: [id number] DOB: [birth year]   Dear Dr. Theophilus:  Thank you for your kind referral of Randall Horton for consultation of neuropathy. Although his history is well known to you, please allow us  to reiterate it for the purpose of our medical record. The patient was accompanied to the clinic by wife who also provides collateral information.     Randall Horton is a 79 y.o. left-handed male with hypertension, insulin -dependent diabetes type 1, hyperlipidemia, interstitial lung disease, GERD, BPH, history of right lacunar stroke (2018, no residual symptoms) and hypertension presenting for evaluation of neuropathy.   IMPRESSION/PLAN: Assessment & Plan Polyneuropathy, length-dependent.  Etiology most likely diabetes.  Discussed that CIDP tends to have a more acute to subacute onset, and likelihood of CIDP is low.  NCS/EMG will be ordered to evaluate for demyelinating neuropathy.  - Ordered nerve conduction study for right arm and right leg  Further recommendations pending results.   ------------------------------------------------------------- History of present illness Discussed the use of AI scribe software for clinical note transcription with the patient, who gave verbal consent to proceed.  History of Present Illness He has been experiencing numbness and tingling primarily in his feet and hands for 10+years. He has significant intolerance to cold in his hands, finding it excruciatingly painful to hold cold objects. Additionally, he experiences sharp, burning sensations in his legs and feet, particularly at night.  Neuropathy in his feet and hands have been gradually worsening over time. He manages his diabetes with an insulin  pump and reports an A1c of 5.7. He describes a significant loss of muscle strength and stability over the past few years, noting difficulty with balance and a  sensation of 'walking on foam rubber' with the balls of his feet. He uses a cane intermittently for balance but has not had any recent falls. He also has difficulty rising from a seated position without support.   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 He does not currently smoke or drink alcohol .   Out-side paper records, electronic medical record, and images have been reviewed where available and summarized as:  MRI brain 07/29/2016: 1. Small acute lacunar infarct of the posterior limb right internal capsule, in keeping with reported left-sided symptoms. No mass effect or hemorrhage. 2. Suspected punctate focus of acute ischemia of the posterior limb of the left internal capsule without mass effect or hemorrhage.  MRA head 07/30/2016: No proximal intracranial stenosis. Intracranial atherosclerotic disease elsewhere as above.   No posterior circulation abnormality is seen which might correlate with the observed pattern of infarction.    Lab Results  Component Value Date   HGBA1C 6.7 (H) 07/31/2016   Lab Results  Component Value Date   TSH 1.639 07/30/2016    Past Medical History:  Diagnosis Date   Arthritis    hands; right hip/knee (07/30/2016)   At risk for sleep apnea    STOP-BANG= 6     SENT TO PCP 06-25-2014   Basal cell carcinoma    right temple & earlobe; frozen, burned, scraped   Chronic cough    FORMER SMOKER   Chronic kidney disease (CKD), stage III (moderate) (HCC)    thelbert 07/29/2016   Environmental allergies    Essential hypertension    /notes 07/30/2016   GERD (gastroesophageal reflux disease)    H/O cardiovascular stress test 2013  everything fine per pt   High triglycerides    History of diabetes with ketoacidosis    1980'S   History of gout    History of hiatal hernia    Hyperlipidemia    /notes 07/29/2016   Incomplete emptying of bladder    Insulin  pump in place    Pain in right hip    injury 2015   Pneumonia  2008   Squamous cell cancer of external ear, right    Stroke (HCC)     subacute infarction in the posterior limb of the right internal capsule/notes 07/29/2016 left hand and arm don't feel right yet; (07/30/2016)   Tachycardia    Tinnitus of both ears    chronic for years   Type I diabetes mellitus (HCC) dx'd ~ 1995   uses insulin  pump (07/30/2016)   Urgency of urination    Wears glasses    Wears partial dentures    UPPER AND LOWER    Past Surgical History:  Procedure Laterality Date   CATARACT EXTRACTION W/ INTRAOCULAR LENS  IMPLANT, BILATERAL  2004 & 2005   CIRCUMCISION  1960's   with Hernia Repair    COLONOSCOPY     CYSTOSCOPY WITH URETHRAL DILATATION N/A 11/02/2014   Procedure: CYSTOSCOPY WITH URETHRAL DILATATION AND  POSSIBLE COOK DILATIION;  Surgeon: Arlena Gal, MD;  Location: [facility] [place];  Service: Urology;  Laterality: N/A;   ESOPHAGOGASTRODUODENOSCOPY ENDOSCOPY  2009   EXCISIONAL HEMORRHOIDECTOMY     EYE SURGERY Right    for diabetic retinopathy   HEMORRHOID BANDING     INGUINAL HERNIA REPAIR Bilateral late 1950's - 1960s   got scrotum caught in accident; had to reattach; had to do one side a 2nd time   SQUAMOUS CELL CARCINOMA EXCISION Right    [facility]   TRANSURETHRAL RESECTION OF PROSTATE N/A 06/28/2014   Procedure: TRANSURETHRAL RESECTION OF THE PROSTATE (TURP);  Surgeon: Arlena LILLETTE Gal, MD;  Location: [facility];  Service: Urology;  Laterality: N/A;     Medications:  Outpatient Encounter Medications as of 07/12/2024  Medication Sig   ACCU-CHEK GUIDE test strip USE TO SELF MONITOR BLOOD GLUCOSE UP TO 10 TIMES DAILY (E11.65)   acetaminophen  (TYLENOL ) 500 MG tablet Take 1,000 mg by mouth every 6 (six) hours as needed for moderate pain or headache.   ADMELOG 100 UNIT/ML injection Inject 12 mLs into the skin daily.   allopurinol  (ZYLOPRIM ) 300 MG tablet Take 300 mg by mouth every other day.   Ascorbic Acid (VITAMIN C)  500 MG CHEW Chew 1 tablet by mouth daily.   atorvastatin  (LIPITOR) 40 MG tablet Take 1 tablet (40 mg total) by mouth daily at 6 PM.   clopidogrel  (PLAVIX ) 75 MG tablet Take 75 mg by mouth daily.   Continuous Glucose Receiver (DEXCOM G7 RECEIVER) DEVI by Does not apply route.   diphenhydrAMINE  (BENADRYL ) 25 MG tablet Take 25 mg by mouth at bedtime as needed for itching (around pump site.).   docusate sodium  (COLACE) 100 MG capsule Take 100 mg by mouth 2 (two) times daily as needed for mild constipation.   finasteride (PROSCAR) 5 MG tablet Take 5 mg by mouth daily.   gabapentin  (NEURONTIN ) 300 MG capsule 300 mg every evening.  (Patient taking differently: 600 mg every evening. 600 mg every evening)   hydrocortisone  1 % ointment Apply topically daily. APPLY TO EARS    hydrocortisone  valerate cream (WESTCORT ) 0.2 % u.a.d qd for rash External   Insulin  Human (INSULIN   PUMP) SOLN Inject 110-120 each into the skin daily. Novolog .   LEXAPRO 10 MG tablet Take 10 mg by mouth daily.   lisinopril (ZESTRIL) 10 MG tablet Take 10 mg by mouth daily.   Melatonin 10 MG CAPS Take by mouth.   Multiple Vitamin (MULTIVITAMIN) tablet Take 1 tablet by mouth daily.   omeprazole  (PRILOSEC  OTC) 20 MG tablet Take 20 mg by mouth every morning.   Pirfenidone  (ESBRIET ) 801 MG TABS Take 1 tablet (801 mg total) by mouth with breakfast, with lunch, and with evening meal.   silodosin (RAPAFLO) 8 MG CAPS capsule Take 8 mg by mouth at bedtime.   traMADol (ULTRAM) 50 MG tablet Take by mouth every 6 (six) hours as needed.   Vitamin D, Cholecalciferol, 50 MCG (2000 UT) CAPS Take 1 capsule by mouth daily.   clotrimazole  (MYCELEX ) 10 MG troche DISSOLVE 1 TABLET UP TO 5 TIMES A DAY (Patient not taking: Reported on 05/26/2024)   Continuous Blood Gluc Sensor (FREESTYLE LIBRE 14 DAY SENSOR) MISC  (Patient not taking: Reported on 07/05/2023)   Continuous Glucose Sensor (DEXCOM G7 SENSOR) MISC by Does not apply route.   fenofibrate  160 MG  tablet Take 160 mg by mouth every evening. (Patient not taking: Reported on 05/26/2024)   loratadine  (CLARITIN ) 10 MG tablet Take 10 mg by mouth 2 (two) times daily. (Patient not taking: Reported on 05/26/2024)   NOVOLOG  100 UNIT/ML injection PER PUMP PATIENT USES 120 UNITS PER DAY DX  250.50   tamsulosin (FLOMAX) 0.4 MG CAPS capsule Take 0.4 mg by mouth daily after supper. (Patient not taking: Reported on 07/05/2023)   No facility-administered encounter medications on file as of 07/12/2024.    Allergies: Allergies[1]  Family History: Family History  Problem Relation Age of Onset   Heart disease Mother    Diabetes Father    Stroke Father    Cancer Father    Colon cancer Neg Hx     Social History: Social History[2] Social History   Social History Narrative   Are you right handed or left handed? Left Handed    Are you currently employed ? No    What is your current occupation? Retired    Do you live at home alone? No    Who lives with you? Wife    What type of home do you live in: 1 story or 2 story? Lives in a one story home with a bonus room on top.         Vital Signs:  BP (!) 109/58   Pulse 98   Ht 5' 10 (1.778 m)   Wt 200 lb (90.7 kg)   SpO2 94%   BMI 28.70 kg/m    Neurological Exam: MENTAL STATUS including orientation to time, place, person, recent and remote memory, attention span and concentration, language, and fund of knowledge is normal.  Speech is not dysarthric.  CRANIAL NERVES: II:  No visual field defects.     III-IV-VI: Pupils equal round and reactive to light.  Normal conjugate, extra-ocular eye movements in all directions of gaze.  No nystagmus.  No ptosis.   V:  Normal facial sensation.    VII:  Normal facial symmetry and movements.   VIII:  Normal hearing and vestibular function.   IX-X:  Normal palatal movement.   XI:  Normal shoulder shrug and head rotation.   XII:  Normal tongue strength and range of motion, no deviation or  fasciculation.  MOTOR:  No atrophy, fasciculations or abnormal  movements.  No pronator drift.   Upper Extremity:  Right  Left  Deltoid  5/5   5/5   Biceps  5/5   5/5   Triceps  5/5   5/5   Wrist extensors  5/5   5/5   Wrist flexors  5/5   5/5   Finger extensors  5/5   5/5   Finger flexors  5/5   5/5   Dorsal interossei  5/5   5/5   Abductor pollicis  5/5   5/5   Tone (Ashworth scale)  0  0   Lower Extremity:  Right  Left  Hip flexors  5/5   5/5   Knee flexors  5/5   5/5   Knee extensors  5/5   5/5   Dorsiflexors  5/5   5/5   Plantarflexors  5/5   5/5   Toe extensors  5/5   5/5   Toe flexors  5/5   5/5   Tone (Ashworth scale)  0  0   MSRs:                                           Right        Left brachioradialis 2+  2+  biceps 2+  2+  triceps 2+  2+  patellar 2+  2+  ankle jerk 0  0  Hoffman no  no  plantar response down  down   SENSORY:  Trace vibration at the ankles bilaterally.  Reduced pin prick and temperature from the mid-calf into the feet, worse over the dorsum of the right foot. Mild sway with Romberg's testing.   COORDINATION/GAIT: Normal finger-to- nose-finger.  Intact rapid alternating movements bilaterally. Gait wide-based, unassisted, mild unsteadiness with turns.   Thank you for allowing me to participate in patient's care.  If I can answer any additional questions, I would be pleased to do so.    Sincerely,    Zigmund Linse K. Starlit Raburn, DO     [1]  Allergies Allergen Reactions   Aspirin Other (See Comments)    Severe stomach cramps + constipation.    Niacin And Related Other (See Comments)    flushing   Penicillins Hives, Itching and Swelling    Has patient had a PCN reaction causing immediate rash, facial/tongue/throat swelling, SOB or lightheadedness with hypotension: YES Has patient had a PCN reaction causing severe rash involving mucus membranes or skin necrosis: NO Has patient had a PCN reaction that required hospitalization NO Has patient  had a PCN reaction occurring within the last 10 years: NO If all of the above answers are NO, then may proceed with Cephalosporin use.   Tamiflu [Oseltamivir Phosphate] Rash  [2]  Social History Tobacco Use   Smoking status: Former    Current packs/day: 0.00    Average packs/day: 1 pack/day for 14.0 years (14.0 ttl pk-yrs)    Types: Cigarettes    Start date: 06/23/1963    Quit date: 06/22/1977    Years since quitting: 47.0   Smokeless tobacco: Current    Types: Snuff  Vaping Use   Vaping status: Never Used  Substance Use Topics   Alcohol  use: No    Alcohol /week: 0.0 standard drinks of alcohol    Drug use: No   "

## 2024-07-13 ENCOUNTER — Other Ambulatory Visit: Payer: Self-pay | Admitting: Pharmacy Technician

## 2024-07-13 ENCOUNTER — Other Ambulatory Visit: Payer: Self-pay

## 2024-07-14 ENCOUNTER — Other Ambulatory Visit (HOSPITAL_COMMUNITY): Payer: Self-pay

## 2024-07-14 ENCOUNTER — Other Ambulatory Visit: Payer: Self-pay

## 2024-07-14 ENCOUNTER — Telehealth: Payer: Self-pay

## 2024-07-14 DIAGNOSIS — J849 Interstitial pulmonary disease, unspecified: Secondary | ICD-10-CM

## 2024-07-14 NOTE — Telephone Encounter (Signed)
 Received notification via specialty pharmacy encounter that pt's copay for pirfenidone  is currently $155. Pt enrolled in Pulmonary Fibrosis grant through [employer]:  Amount: $5000 Award Period: 07/06/24 - 07/14/25 BIN: [id number] PCN: [id number] Group: [id number] ID: [id number]  For pharmacy inquiries, contact PDMI at [phone]. For patient inquiries, contact [employer] at [phone].

## 2024-07-18 ENCOUNTER — Other Ambulatory Visit (HOSPITAL_COMMUNITY): Payer: Self-pay

## 2024-07-18 NOTE — Addendum Note (Signed)
 Addended by: SHAREN DELON HERO on: 07/18/2024 03:25 PM   Modules accepted: Orders

## 2024-07-20 ENCOUNTER — Ambulatory Visit: Attending: Pulmonary Disease

## 2024-07-20 ENCOUNTER — Other Ambulatory Visit (HOSPITAL_COMMUNITY): Payer: Self-pay

## 2024-07-20 ENCOUNTER — Other Ambulatory Visit: Payer: Self-pay

## 2024-07-20 DIAGNOSIS — Z79899 Other long term (current) drug therapy: Secondary | ICD-10-CM

## 2024-07-20 DIAGNOSIS — J849 Interstitial pulmonary disease, unspecified: Secondary | ICD-10-CM

## 2024-07-20 MED ORDER — PIRFENIDONE 801 MG PO TABS
801.0000 mg | ORAL_TABLET | Freq: Three times a day (TID) | ORAL | 1 refills | Status: AC
  Filled 2024-07-20: qty 90, 30d supply, fill #0

## 2024-07-20 NOTE — Progress Notes (Signed)
 Specialty Pharmacy Refill Coordination Note  DRAGON THRUSH is a 79 y.o. male contacted today regarding refills of specialty medication(s) Pirfenidone    Patient requested Delivery   Delivery date: 07/27/24   Verified address: [address] [place], [place] [address]   Medication will be filled on: 07/26/24

## 2024-07-20 NOTE — Progress Notes (Signed)
 [facility]  Referring Provider: Lonna Coder  Virtual Visit via Telephone Note  I connected with Randall Horton on 07/20/24 at  3:15 PM EST by telephone and verified that I am speaking with the correct person using two identifiers.  Location: Patient: home Provider: office   I discussed the limitations, risks, security and privacy concerns of performing an evaluation and management service by telephone and the availability of in person appointments. I also discussed with the patient that there may be a patient responsible charge related to this service. The patient expressed understanding and agreed to proceed.   HPI: Randall Horton is a 79 y.o. male who presents to the pharmacotherapy clinic via telephone for continuation of therapy with Pirfenidone  for Interstitial Lung Disease.  Patient Active Problem List   Diagnosis Date Noted   Intestinal obstruction (HCC) 03/12/2021   Spondylosis without myelopathy or radiculopathy, cervical region 09/11/2020   History of stroke 08/31/2017   Essential hypertension 07/30/2016   Hyperlipidemia 07/30/2016   CKD stage 3 secondary to diabetes (HCC) 07/30/2016   Cerebrovascular accident (CVA) (HCC) 07/29/2016   Benign prostatic hyperplasia 06/28/2014   Type 1 diabetes mellitus (HCC) 05/22/2008   GERD 05/22/2008   DYSPHAGIA 05/22/2008    Patient's Medications  New Prescriptions   No medications on file  Previous Medications   ACCU-CHEK GUIDE TEST STRIP    USE TO SELF MONITOR BLOOD GLUCOSE UP TO 10 TIMES DAILY (E11.65)   ACETAMINOPHEN  (TYLENOL ) 500 MG TABLET    Take 1,000 mg by mouth every 6 (six) hours as needed for moderate pain or headache.   ADMELOG 100 UNIT/ML INJECTION    Inject 12 mLs into the skin daily.   ALLOPURINOL  (ZYLOPRIM ) 300 MG TABLET    Take 300 mg by mouth every other day.   ASCORBIC ACID (VITAMIN C) 500 MG CHEW    Chew 1 tablet by mouth daily.   ATORVASTATIN  (LIPITOR) 40 MG TABLET    Take 1 tablet (40  mg total) by mouth daily at 6 PM.   CLOPIDOGREL  (PLAVIX ) 75 MG TABLET    Take 75 mg by mouth daily.   CLOTRIMAZOLE  (MYCELEX ) 10 MG TROCHE    DISSOLVE 1 TABLET UP TO 5 TIMES A DAY   CONTINUOUS BLOOD GLUC SENSOR (FREESTYLE LIBRE 14 DAY SENSOR) MISC       CONTINUOUS GLUCOSE RECEIVER (DEXCOM G7 RECEIVER) DEVI    by Does not apply route.   CONTINUOUS GLUCOSE SENSOR (DEXCOM G7 SENSOR) MISC    by Does not apply route.   DIPHENHYDRAMINE  (BENADRYL ) 25 MG TABLET    Take 25 mg by mouth at bedtime as needed for itching (around pump site.).   DOCUSATE SODIUM  (COLACE) 100 MG CAPSULE    Take 100 mg by mouth 2 (two) times daily as needed for mild constipation.   FENOFIBRATE  160 MG TABLET    Take 160 mg by mouth every evening.   FINASTERIDE (PROSCAR) 5 MG TABLET    Take 5 mg by mouth daily.   GABAPENTIN  (NEURONTIN ) 300 MG CAPSULE    300 mg every evening.    HYDROCORTISONE  1 % OINTMENT    Apply topically daily. APPLY TO EARS    HYDROCORTISONE  VALERATE CREAM (WESTCORT ) 0.2 %    u.a.d qd for rash External   INSULIN  HUMAN (INSULIN  PUMP) SOLN    Inject 110-120 each into the skin daily. Novolog .   LEXAPRO 10 MG TABLET    Take 10 mg by mouth daily.   LISINOPRIL (  ZESTRIL) 10 MG TABLET    Take 10 mg by mouth daily.   LORATADINE  (CLARITIN ) 10 MG TABLET    Take 10 mg by mouth 2 (two) times daily.   MELATONIN 10 MG CAPS    Take by mouth.   MULTIPLE VITAMIN (MULTIVITAMIN) TABLET    Take 1 tablet by mouth daily.   NOVOLOG  100 UNIT/ML INJECTION    PER PUMP PATIENT USES 120 UNITS PER DAY DX  250.50   OMEPRAZOLE  (PRILOSEC  OTC) 20 MG TABLET    Take 20 mg by mouth every morning.   SILODOSIN (RAPAFLO) 8 MG CAPS CAPSULE    Take 8 mg by mouth at bedtime.   TAMSULOSIN (FLOMAX) 0.4 MG CAPS CAPSULE    Take 0.4 mg by mouth daily after supper.   TRAMADOL (ULTRAM) 50 MG TABLET    Take by mouth every 6 (six) hours as needed.   VITAMIN D, CHOLECALCIFEROL, 50 MCG (2000 UT) CAPS    Take 1 capsule by mouth daily.  Modified Medications    Modified Medication Previous Medication   PIRFENIDONE  (ESBRIET ) 801 MG TABS Pirfenidone  (ESBRIET ) 801 MG TABS      Take 1 tablet (801 mg total) by mouth with breakfast, with lunch, and with evening meal.    Take 1 tablet (801 mg total) by mouth with breakfast, with lunch, and with evening meal.  Discontinued Medications   No medications on file    Allergies: Allergies[1]  Past Medical History: Past Medical History:  Diagnosis Date   Arthritis    hands; right hip/knee (07/30/2016)   At risk for sleep apnea    STOP-BANG= 6     SENT TO PCP 06-25-2014   Basal cell carcinoma    right temple & earlobe; frozen, burned, scraped   Chronic cough    FORMER SMOKER   Chronic kidney disease (CKD), stage III (moderate) (HCC)    thelbert 07/29/2016   Environmental allergies    Essential hypertension    thelbert 07/30/2016   GERD (gastroesophageal reflux disease)    H/O cardiovascular stress test 2013   everything fine per pt   High triglycerides    History of diabetes with ketoacidosis    1980'S   History of gout    History of hiatal hernia    Hyperlipidemia    /notes 07/29/2016   Incomplete emptying of bladder    Insulin  pump in place    Pain in right hip    injury 2015   Pneumonia 2008   Squamous cell cancer of external ear, right    Stroke (HCC)     subacute infarction in the posterior limb of the right internal capsule/notes 07/29/2016 left hand and arm don't feel right yet; (07/30/2016)   Tachycardia    Tinnitus of both ears    chronic for years   Type I diabetes mellitus (HCC) dx'd ~ 1995   uses insulin  pump (07/30/2016)   Urgency of urination    Wears glasses    Wears partial dentures    UPPER AND LOWER    Social History: Social History   Socioeconomic History   Marital status: Married    Spouse name: Not on file   Number of children: Not on file   Years of education: Not on file   Highest education level: Not on file  Occupational History   Occupation: retired   Tobacco Use   Smoking status: Former    Current packs/day: 0.00    Average packs/day: 1 pack/day for 14.0 years (  14.0 ttl pk-yrs)    Types: Cigarettes    Start date: 06/23/1963    Quit date: 06/22/1977    Years since quitting: 47.1   Smokeless tobacco: Current    Types: Snuff  Vaping Use   Vaping status: Never Used  Substance and Sexual Activity   Alcohol  use: No    Alcohol /week: 0.0 standard drinks of alcohol    Drug use: No   Sexual activity: Not Currently  Other Topics Concern   Not on file  Social History Narrative   Are you right handed or left handed? Left Handed    Are you currently employed ? No    What is your current occupation? Retired    Do you live at home alone? No    Who lives with you? Wife    What type of home do you live in: 1 story or 2 story? Lives in a one story home with a bonus room on top.        Social Drivers of Health   Tobacco Use: High Risk (07/12/2024)   Patient History    Smoking Tobacco Use: Former    Smokeless Tobacco Use: Current    Passive Exposure: Not on Actuary Strain: Not on file  Food Insecurity: Not on file  Transportation Needs: Not on file  Physical Activity: Not on file  Stress: Not on file  Social Connections: Not on file  Depression (EYV7-0): Not on file  Alcohol  Screen: Not on file  Housing: Not on file  Utilities: Not on file  Health Literacy: Not on file    Medication: Pirfenidone  801mg  3 times daily  Assessment and Plan  Esbriet  Medication Management Thoroughly counseled patient on the efficacy, mechanism of action, dosing, administration, adverse effects, and monitoring parameters of Esbriet .  Patient verbalized understanding.   Goals of Therapy: Will not stop or reverse the progression of ILD. It will slow the progression of ILD.   Dosing: Starting dose will be Esbriet  267 mg 1 tablet three times daily for 7 days, then 2 tablets three times daily for 7 days, then 3 tablets three times daily.   Maintenance dose will be 801 mg 1 tablet three times daily if tolerated.  Stressed the importance of taking with meals and space at least 5-6 hours apart to minimize stomach upset.   Adverse Effects: Nausea, vomiting, diarrhea, weight loss Abdominal pain GERD Sun sensitivity/rash - patient advised to wear sunscreen when exposed to sunlight Dizziness Fatigue  Monitoring: Monitor for diarrhea, nausea and vomiting, GI perforation, hepatotoxicity  Monitor LFTs - baseline, monthly for first 6 months, then every 3 months routinely Pregnancy test - baseline prn CBC w differential at baseline and every 3 months routinely  Medication Reconciliation A drug regimen assessment was performed, including review of allergies, interactions, disease-state management, dosing and immunization history. Medications were reviewed with the patient, including name, instructions, indication, goals of therapy, potential side effects, importance of adherence, and safe use.   I discussed the assessment and treatment plan with the patient. The patient was provided an opportunity to ask questions and all were answered. The patient agreed with the plan and demonstrated an understanding of the instructions.   The patient was advised to call back or seek an in-person evaluation if the symptoms worsen or if the condition fails to improve as anticipated.  I provided 15 minutes of non-face-to-face time during this encounter.  Delon Brow, PharmD, CSP, AAHIVP, CPP Clinical Pharmacist Practitioner - Medication Therapy Disease Management/Specialty Pharmacy  Services 07/20/2024, 3:31 PM     [1]  Allergies Allergen Reactions   Aspirin Other (See Comments)    Severe stomach cramps + constipation.    Niacin And Related Other (See Comments)    flushing   Penicillins Hives, Itching and Swelling    Has patient had a PCN reaction causing immediate rash, facial/tongue/throat swelling, SOB or lightheadedness with  hypotension: YES Has patient had a PCN reaction causing severe rash involving mucus membranes or skin necrosis: NO Has patient had a PCN reaction that required hospitalization NO Has patient had a PCN reaction occurring within the last 10 years: NO If all of the above answers are NO, then may proceed with Cephalosporin use.   Tamiflu [Oseltamivir Phosphate] Rash

## 2024-07-26 ENCOUNTER — Other Ambulatory Visit: Payer: Self-pay

## 2024-08-17 ENCOUNTER — Encounter: Payer: Self-pay | Admitting: Neurology

## 2024-11-23 ENCOUNTER — Other Ambulatory Visit
